# Patient Record
Sex: Female | Born: 1950
Health system: Southern US, Community
[De-identification: ages and names within clinical notes are randomized; demographics above are authoritative.]

## PROBLEM LIST (undated history)

## (undated) DIAGNOSIS — J449 Chronic obstructive pulmonary disease, unspecified: Secondary | ICD-10-CM

## (undated) DIAGNOSIS — E119 Type 2 diabetes mellitus without complications: Secondary | ICD-10-CM

## (undated) DIAGNOSIS — R609 Edema, unspecified: Secondary | ICD-10-CM

## (undated) DIAGNOSIS — R600 Localized edema: Secondary | ICD-10-CM

## (undated) DIAGNOSIS — K56609 Unspecified intestinal obstruction, unspecified as to partial versus complete obstruction: Secondary | ICD-10-CM

## (undated) DIAGNOSIS — J189 Pneumonia, unspecified organism: Secondary | ICD-10-CM

## (undated) DIAGNOSIS — M1990 Unspecified osteoarthritis, unspecified site: Secondary | ICD-10-CM

## (undated) DIAGNOSIS — E785 Hyperlipidemia, unspecified: Secondary | ICD-10-CM

## (undated) DIAGNOSIS — K635 Polyp of colon: Secondary | ICD-10-CM

## (undated) DIAGNOSIS — J45909 Unspecified asthma, uncomplicated: Secondary | ICD-10-CM

## (undated) DIAGNOSIS — I10 Essential (primary) hypertension: Secondary | ICD-10-CM

## (undated) HISTORY — DX: Unspecified asthma, uncomplicated: J45.909

## (undated) HISTORY — PX: APPENDECTOMY: SHX54

## (undated) HISTORY — DX: Hyperlipidemia, unspecified: E78.5

## (undated) HISTORY — DX: Edema, unspecified: R60.9

## (undated) HISTORY — PX: SMALL INTESTINE SURGERY: SHX150

## (undated) HISTORY — PX: LAPAROSCOPIC INCISIONAL / UMBILICAL / VENTRAL HERNIA REPAIR: SUR789

## (undated) HISTORY — DX: Localized edema: R60.0

## (undated) HISTORY — DX: Polyp of colon: K63.5

## (undated) HISTORY — PX: HERNIA REPAIR: SHX51

## (undated) HISTORY — PX: ESOPHAGEAL DILATION: SHX303

---

## 1976-04-20 HISTORY — PX: TUBAL LIGATION: SHX77

## 1977-04-20 HISTORY — PX: CHOLECYSTECTOMY: SHX55

## 2000-11-19 ENCOUNTER — Ambulatory Visit (HOSPITAL_COMMUNITY): Admission: RE | Admit: 2000-11-19 | Discharge: 2000-11-19 | Payer: Self-pay | Admitting: Family Medicine

## 2000-11-19 ENCOUNTER — Encounter: Payer: Self-pay | Admitting: Family Medicine

## 2001-01-04 ENCOUNTER — Encounter: Payer: Self-pay | Admitting: Family Medicine

## 2001-01-04 ENCOUNTER — Ambulatory Visit (HOSPITAL_COMMUNITY): Admission: RE | Admit: 2001-01-04 | Discharge: 2001-01-04 | Payer: Self-pay | Admitting: Family Medicine

## 2001-02-02 ENCOUNTER — Ambulatory Visit (HOSPITAL_COMMUNITY): Admission: RE | Admit: 2001-02-02 | Discharge: 2001-02-02 | Payer: Self-pay | Admitting: General Surgery

## 2001-02-08 ENCOUNTER — Inpatient Hospital Stay (HOSPITAL_COMMUNITY): Admission: RE | Admit: 2001-02-08 | Discharge: 2001-02-14 | Payer: Self-pay | Admitting: General Surgery

## 2001-02-26 ENCOUNTER — Emergency Department (HOSPITAL_COMMUNITY): Admission: EM | Admit: 2001-02-26 | Discharge: 2001-02-26 | Payer: Self-pay | Admitting: Internal Medicine

## 2001-02-26 ENCOUNTER — Encounter: Payer: Self-pay | Admitting: Internal Medicine

## 2001-03-15 ENCOUNTER — Inpatient Hospital Stay (HOSPITAL_COMMUNITY): Admission: RE | Admit: 2001-03-15 | Discharge: 2001-03-16 | Payer: Self-pay | Admitting: General Surgery

## 2002-01-26 ENCOUNTER — Inpatient Hospital Stay (HOSPITAL_COMMUNITY): Admission: AD | Admit: 2002-01-26 | Discharge: 2002-01-30 | Payer: Self-pay | Admitting: Family Medicine

## 2002-01-27 ENCOUNTER — Encounter: Payer: Self-pay | Admitting: Family Medicine

## 2002-09-11 ENCOUNTER — Ambulatory Visit (HOSPITAL_COMMUNITY): Admission: RE | Admit: 2002-09-11 | Discharge: 2002-09-11 | Payer: Self-pay | Admitting: General Surgery

## 2002-09-29 ENCOUNTER — Inpatient Hospital Stay (HOSPITAL_COMMUNITY): Admission: AD | Admit: 2002-09-29 | Discharge: 2002-10-02 | Payer: Self-pay | Admitting: Family Medicine

## 2002-09-29 ENCOUNTER — Encounter: Payer: Self-pay | Admitting: Family Medicine

## 2003-03-28 ENCOUNTER — Ambulatory Visit (HOSPITAL_COMMUNITY): Admission: RE | Admit: 2003-03-28 | Discharge: 2003-03-28 | Payer: Self-pay | Admitting: Family Medicine

## 2003-07-20 HISTORY — PX: KNEE CARTILAGE SURGERY: SHX688

## 2003-07-26 ENCOUNTER — Encounter: Payer: Self-pay | Admitting: Orthopedic Surgery

## 2003-08-06 ENCOUNTER — Ambulatory Visit (HOSPITAL_COMMUNITY): Admission: RE | Admit: 2003-08-06 | Discharge: 2003-08-06 | Payer: Self-pay | Admitting: Orthopedic Surgery

## 2003-08-15 ENCOUNTER — Ambulatory Visit (HOSPITAL_COMMUNITY): Admission: RE | Admit: 2003-08-15 | Discharge: 2003-08-15 | Payer: Self-pay | Admitting: Orthopedic Surgery

## 2003-08-15 ENCOUNTER — Encounter: Payer: Self-pay | Admitting: Orthopedic Surgery

## 2003-08-23 ENCOUNTER — Encounter (HOSPITAL_COMMUNITY): Admission: RE | Admit: 2003-08-23 | Discharge: 2003-09-22 | Payer: Self-pay | Admitting: Orthopedic Surgery

## 2003-10-24 ENCOUNTER — Ambulatory Visit (HOSPITAL_COMMUNITY): Admission: RE | Admit: 2003-10-24 | Discharge: 2003-10-24 | Payer: Self-pay | Admitting: Family Medicine

## 2004-03-20 ENCOUNTER — Ambulatory Visit (HOSPITAL_COMMUNITY): Admission: RE | Admit: 2004-03-20 | Discharge: 2004-03-20 | Payer: Self-pay | Admitting: Family Medicine

## 2004-06-02 ENCOUNTER — Ambulatory Visit: Payer: Self-pay | Admitting: Orthopedic Surgery

## 2004-06-16 ENCOUNTER — Ambulatory Visit: Payer: Self-pay | Admitting: Orthopedic Surgery

## 2004-07-07 ENCOUNTER — Ambulatory Visit: Payer: Self-pay | Admitting: Orthopedic Surgery

## 2004-09-10 ENCOUNTER — Ambulatory Visit: Payer: Self-pay | Admitting: Orthopedic Surgery

## 2004-09-18 ENCOUNTER — Inpatient Hospital Stay (HOSPITAL_COMMUNITY): Admission: AD | Admit: 2004-09-18 | Discharge: 2004-09-21 | Payer: Self-pay | Admitting: Family Medicine

## 2004-10-01 ENCOUNTER — Ambulatory Visit: Payer: Self-pay | Admitting: Orthopedic Surgery

## 2004-12-02 ENCOUNTER — Ambulatory Visit (HOSPITAL_COMMUNITY): Admission: RE | Admit: 2004-12-02 | Discharge: 2004-12-02 | Payer: Self-pay | Admitting: Family Medicine

## 2004-12-03 ENCOUNTER — Ambulatory Visit: Payer: Self-pay | Admitting: Orthopedic Surgery

## 2004-12-05 ENCOUNTER — Ambulatory Visit (HOSPITAL_COMMUNITY): Admission: RE | Admit: 2004-12-05 | Discharge: 2004-12-05 | Payer: Self-pay | Admitting: Family Medicine

## 2005-02-06 ENCOUNTER — Encounter (INDEPENDENT_AMBULATORY_CARE_PROVIDER_SITE_OTHER): Payer: Self-pay | Admitting: General Surgery

## 2005-02-06 ENCOUNTER — Ambulatory Visit (HOSPITAL_COMMUNITY): Admission: RE | Admit: 2005-02-06 | Discharge: 2005-02-06 | Payer: Self-pay | Admitting: General Surgery

## 2005-02-23 ENCOUNTER — Ambulatory Visit: Payer: Self-pay | Admitting: Orthopedic Surgery

## 2005-02-26 ENCOUNTER — Ambulatory Visit (HOSPITAL_COMMUNITY): Admission: RE | Admit: 2005-02-26 | Discharge: 2005-02-26 | Payer: Self-pay | Admitting: Orthopedic Surgery

## 2005-03-05 ENCOUNTER — Ambulatory Visit: Payer: Self-pay | Admitting: Orthopedic Surgery

## 2005-03-10 ENCOUNTER — Ambulatory Visit (HOSPITAL_COMMUNITY): Admission: RE | Admit: 2005-03-10 | Discharge: 2005-03-10 | Payer: Self-pay | Admitting: Family Medicine

## 2005-03-31 ENCOUNTER — Encounter: Admission: RE | Admit: 2005-03-31 | Discharge: 2005-03-31 | Payer: Self-pay | Admitting: Orthopedic Surgery

## 2005-04-15 ENCOUNTER — Encounter: Admission: RE | Admit: 2005-04-15 | Discharge: 2005-04-15 | Payer: Self-pay | Admitting: Orthopedic Surgery

## 2005-04-27 ENCOUNTER — Encounter: Admission: RE | Admit: 2005-04-27 | Discharge: 2005-04-27 | Payer: Self-pay | Admitting: Orthopedic Surgery

## 2005-09-24 ENCOUNTER — Ambulatory Visit: Payer: Self-pay | Admitting: Orthopedic Surgery

## 2005-09-30 ENCOUNTER — Ambulatory Visit (HOSPITAL_COMMUNITY): Admission: RE | Admit: 2005-09-30 | Discharge: 2005-09-30 | Payer: Self-pay | Admitting: Orthopedic Surgery

## 2005-09-30 ENCOUNTER — Encounter (HOSPITAL_COMMUNITY): Admission: RE | Admit: 2005-09-30 | Discharge: 2005-10-30 | Payer: Self-pay | Admitting: Orthopedic Surgery

## 2005-10-14 ENCOUNTER — Ambulatory Visit: Payer: Self-pay | Admitting: Orthopedic Surgery

## 2005-11-02 ENCOUNTER — Encounter (HOSPITAL_COMMUNITY): Admission: RE | Admit: 2005-11-02 | Discharge: 2005-12-02 | Payer: Self-pay | Admitting: Orthopedic Surgery

## 2005-11-10 ENCOUNTER — Ambulatory Visit: Payer: Self-pay | Admitting: Orthopedic Surgery

## 2006-04-20 HISTORY — PX: COLONOSCOPY: SHX174

## 2006-12-07 ENCOUNTER — Ambulatory Visit: Payer: Self-pay | Admitting: Orthopedic Surgery

## 2006-12-07 DIAGNOSIS — E119 Type 2 diabetes mellitus without complications: Secondary | ICD-10-CM | POA: Insufficient documentation

## 2006-12-10 ENCOUNTER — Ambulatory Visit (HOSPITAL_COMMUNITY): Admission: RE | Admit: 2006-12-10 | Discharge: 2006-12-10 | Payer: Self-pay | Admitting: Orthopedic Surgery

## 2006-12-14 ENCOUNTER — Ambulatory Visit: Payer: Self-pay | Admitting: Orthopedic Surgery

## 2006-12-31 ENCOUNTER — Ambulatory Visit (HOSPITAL_COMMUNITY): Admission: RE | Admit: 2006-12-31 | Discharge: 2006-12-31 | Payer: Self-pay | Admitting: Family Medicine

## 2007-01-04 ENCOUNTER — Encounter: Admission: RE | Admit: 2007-01-04 | Discharge: 2007-01-04 | Payer: Self-pay | Admitting: Family Medicine

## 2007-01-19 ENCOUNTER — Ambulatory Visit: Payer: Self-pay | Admitting: Orthopedic Surgery

## 2007-01-28 ENCOUNTER — Inpatient Hospital Stay (HOSPITAL_COMMUNITY): Admission: RE | Admit: 2007-01-28 | Discharge: 2007-01-31 | Payer: Self-pay | Admitting: Gastroenterology

## 2007-01-28 ENCOUNTER — Ambulatory Visit: Payer: Self-pay | Admitting: Gastroenterology

## 2007-01-28 ENCOUNTER — Encounter: Payer: Self-pay | Admitting: Gastroenterology

## 2007-02-15 ENCOUNTER — Ambulatory Visit (HOSPITAL_COMMUNITY): Admission: RE | Admit: 2007-02-15 | Discharge: 2007-02-15 | Payer: Self-pay | Admitting: Family Medicine

## 2007-02-23 ENCOUNTER — Ambulatory Visit: Payer: Self-pay | Admitting: Orthopedic Surgery

## 2007-02-23 DIAGNOSIS — M76829 Posterior tibial tendinitis, unspecified leg: Secondary | ICD-10-CM

## 2007-03-10 ENCOUNTER — Telehealth: Payer: Self-pay | Admitting: Orthopedic Surgery

## 2007-03-28 ENCOUNTER — Telehealth: Payer: Self-pay | Admitting: Orthopedic Surgery

## 2007-04-07 ENCOUNTER — Ambulatory Visit: Payer: Self-pay | Admitting: Orthopedic Surgery

## 2007-04-20 ENCOUNTER — Telehealth: Payer: Self-pay | Admitting: Orthopedic Surgery

## 2007-07-19 ENCOUNTER — Ambulatory Visit: Payer: Self-pay | Admitting: Orthopedic Surgery

## 2007-08-02 ENCOUNTER — Encounter: Payer: Self-pay | Admitting: Orthopedic Surgery

## 2007-09-08 ENCOUNTER — Ambulatory Visit: Payer: Self-pay | Admitting: Orthopedic Surgery

## 2007-09-08 DIAGNOSIS — M1710 Unilateral primary osteoarthritis, unspecified knee: Secondary | ICD-10-CM

## 2007-09-26 ENCOUNTER — Ambulatory Visit: Payer: Self-pay | Admitting: Orthopedic Surgery

## 2007-10-28 ENCOUNTER — Ambulatory Visit (HOSPITAL_COMMUNITY): Admission: RE | Admit: 2007-10-28 | Discharge: 2007-10-28 | Payer: Self-pay | Admitting: Family Medicine

## 2007-12-05 ENCOUNTER — Ambulatory Visit (HOSPITAL_COMMUNITY): Admission: RE | Admit: 2007-12-05 | Discharge: 2007-12-05 | Payer: Self-pay | Admitting: Family Medicine

## 2007-12-13 ENCOUNTER — Ambulatory Visit (HOSPITAL_COMMUNITY): Admission: RE | Admit: 2007-12-13 | Discharge: 2007-12-13 | Payer: Self-pay | Admitting: Family Medicine

## 2009-02-12 ENCOUNTER — Emergency Department (HOSPITAL_COMMUNITY): Admission: EM | Admit: 2009-02-12 | Discharge: 2009-02-12 | Payer: Self-pay | Admitting: Emergency Medicine

## 2009-04-03 ENCOUNTER — Ambulatory Visit: Payer: Self-pay | Admitting: Orthopedic Surgery

## 2009-06-11 ENCOUNTER — Ambulatory Visit: Payer: Self-pay | Admitting: Orthopedic Surgery

## 2009-06-11 DIAGNOSIS — M6530 Trigger finger, unspecified finger: Secondary | ICD-10-CM | POA: Insufficient documentation

## 2009-08-12 ENCOUNTER — Ambulatory Visit: Payer: Self-pay | Admitting: Orthopedic Surgery

## 2009-08-23 ENCOUNTER — Ambulatory Visit: Payer: Self-pay | Admitting: Orthopedic Surgery

## 2009-10-02 ENCOUNTER — Ambulatory Visit (HOSPITAL_COMMUNITY): Admission: RE | Admit: 2009-10-02 | Discharge: 2009-10-02 | Payer: Self-pay | Admitting: Family Medicine

## 2009-10-02 ENCOUNTER — Encounter: Payer: Self-pay | Admitting: Orthopedic Surgery

## 2009-10-24 ENCOUNTER — Encounter: Payer: Self-pay | Admitting: Gastroenterology

## 2009-12-26 ENCOUNTER — Ambulatory Visit (HOSPITAL_COMMUNITY): Admission: RE | Admit: 2009-12-26 | Discharge: 2009-12-26 | Payer: Self-pay | Admitting: Family Medicine

## 2010-05-10 ENCOUNTER — Encounter: Payer: Self-pay | Admitting: Family Medicine

## 2010-05-11 ENCOUNTER — Encounter: Payer: Self-pay | Admitting: Family Medicine

## 2010-05-20 NOTE — Assessment & Plan Note (Signed)
Summary: rt hand pain/swelling needs xr/self pay/bsf   Vital Signs:  Patient profile:   60 year old female Pulse rate:   78 / minute Resp:     16 per minute  Vitals Entered By: Fuller Canada MD (June 11, 2009 1:12 PM)  Visit Type:  new problem Referring Provider:  self Primary Provider:  Dr. Lubertha South   History of Present Illness: I saw [name] in the office today for a followup visit.  She is a 60 years old woman with the complaint of:  right hand and thumb pain, trigger thumb.  Patient complains of pain in her RIGHT thumb for 2 weeks no injury complains of locking.  She is taking some Norco 7.5 she says that helps a little bit.  She had a brace which immobilized the thumb and she associates and tingling in her RIGHT hand as well.  She complains of locking and catching her symptoms are constant came on gradually and improved by nothing she complains of some sharp throbbing intermediate type pain.  She has an obvious trigger finger.  Meds: Metformin, Celexa, Pravastatin, Glyburide.  Allergies (verified): 1)  ! Penicillin  Past History:  Past medical, surgical, family and social histories (including risk factors) reviewed, and no changes noted (except as noted below).  Past Medical History: Diabetes htn depression  Past Surgical History: Reviewed history from 12/07/2006 and no changes required. Appendectomy Cholecystectomy tubal 2 hernia repairs 08-15-03 arthroscopy right knee -Dr. Romeo Apple, 08-15-03 medial ,eniscetomy right knee-Dr. Romeo Apple,  Family History: Reviewed history and no changes required. FH of Cancer:  Family History of Diabetes Family History Coronary Heart Disease female < 79 Family History of Arthritis  Social History: Reviewed history and no changes required. Patient is married.  office manager 1ppd cigs no alcohol 3 cups of caffeine per day  Review of Systems General:  Complains of fatigue; denies weight loss, weight gain,  fever, and chills. Neuro:  Complains of numbness; denies headache, dizziness, migraines, weakness, tremor, and unsteady walking; tingling. MS:  Complains of joint pain and joint swelling; denies rheumatoid arthritis, gout, bone cancer, osteoporosis, and ; stiffness.  The review of systems is negative for Cardiac , Resp, GI, GU, Endo, Psych, Derm, EENT, Immunology, and Lymphatic.  Physical Exam  Additional Exam:   VS reviewed and were normal  GEN: appearance was normal   CDV: normal pulses temperature and no edema  LYMPH nodes were normal   SKIN was normal   Neuro: normal sensation Psyche: AAO x 3 and mood was normal   MSK *Gait was normal  *Inspection Tenderness over the A1 pulley of the RIGHT hand/thumb, range of motion however is intact much and power at the IP joint is normal and the joint is stable.    Impression & Recommendations:  Problem # 1:  TRIGGER FINGER (ICD-727.03) Assessment New  RIGHT thumb injection Verbal consent was obtained: The finger was prepped with ethyl chloride and injected with 1:1 injection of .25% sensorcaine, 1cc  and 40 mg of depomedrol, 1cc. There were no complications.  Orders: Est. Patient Level IV (54098) Joint Aspirate / Injection, Small (11914) Depo- Medrol 40mg  (J1030)  Patient Instructions: 1)  You have received an injection of cortisone today. You may experience increased pain at the injection site. Apply ice pack to the area for 20 minutes every 2 hours and take 2 xtra strength tylenol every 8 hours. This increased pain will usually resolve in 24 hours. The injection will take effect in 3-10 days.  2)  Please schedule a follow-up appointment as needed.

## 2010-05-20 NOTE — Letter (Signed)
Summary: Out of Work  [facility]  [address]  [place], [place] [address]   Phone: [phone]  Fax: [phone]    August 12, 2009   Employee:  LEANNE SISLER    To Whom It May Concern:   For Medical reasons, please excuse the above named employee from work for the following dates:  Start:   August 12, 2009  End:   May 9th  If you need additional information, please feel free to contact our office.         Sincerely,    Fuller Canada MD

## 2010-05-20 NOTE — Assessment & Plan Note (Signed)
Summary: LT FOOT PAIN RECURRING/?XRAY/SELF PAY/CAF   Visit Type:  Follow-up Referring Provider:  self Primary Provider:  Dr. Lubertha South  CC:  left foot pain.  History of Present Illness: I saw [name] in the office today for a followup visit.  She is a 60 years old woman with the complaint of:  severe pain LEFT foot  The patient has a history of posterior tibial tendon dysfunction with MRI documented arthritis in the foot without a tear so she basically has a grade 2 condition in the tendon but grade 3-4 changes in the foot probably needs tendon transfer with bone fusion or calcaneal slide at minimum.  She is currently uninsured.  She does work and worked last Monday.  Pain present for 3 days medial side of the foot associated with weakness    Meds: Metformin, Celexa, Pravastatin, Glyburide.  Foot pain for 3 days, yesterday extremely bad. She has taken Hydrocodone 7.5, ice, heat and arthritis medicine and they are not helping.  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 One By Mouth Q 4 Hrs As Needed Pain  Allergies (verified): 1)  ! Penicillin  Review of Systems Constitutional:  Denies weight loss, weight gain, fever, chills, and fatigue. Cardiovascular:  Denies chest pain, palpitations, fainting, and murmurs. Respiratory:  Denies short of breath, wheezing, couch, tightness, pain on inspiration, and snoring . Musculoskeletal:  See HPI.  Physical Exam  Additional Exam:  overweight white female normal appearance.  Oriented x3.  Mood and affect normal.  Gait and station abnormal severe limp LEFT leg.  She has a flattened foot poor subtalar motion ankle stable tenderness medial side poor strength on tiptoe to skin intact pulses  normal sensation normal coordination and balance abnormal secondary to pain in the LEFT foot   Impression & Recommendations:  Problem # 1:  TIBIALIS TENDINITIS (ICD-726.72) Assessment Deteriorated  previous MRI showed no tear in the tendon but degenerative arthritis in the foot this is a close call in terms of what to do but tendon needs to be debris to possible tendon transfer and then bone work as needed fusion vs. calcaneal slide, triple arthrodesis may not be adequate  I will order a Cam Walker for her from [facility] its $25.95.  I want her to be nonweightbearing for 2 weeks then Cam Walker for 4 weeks and then reassess.  If she needs surgery she'll need foot and ankle specialist podiatrist could suffice as well  Orders: Est. Patient Level III (54098)  Medications Added to Medication List This Visit: 1)  Norco 7.5-325 Mg Tabs (Hydrocodone-acetaminophen) .... One by mouth q 4 hrs as needed pain  Patient Instructions: 1)  Do not put any pressure on the foot for 2 weeks 2)  Use the walker for 2 weeks 3)  We will try to order you a cheaper boot, large cam walker boot 4)  we will call you when we get the boot in Prescriptions: NORCO 7.5-325 MG TABS (HYDROCODONE-ACETAMINOPHEN) one by mouth q 4 hrs as needed pain  #42 x 3   Entered and Authorized by:   Fuller Canada MD   Signed by:   Fuller Canada MD on 08/12/2009   Method used:   Print then Give to Patient  RxID:   [id number]

## 2010-05-20 NOTE — Letter (Signed)
Summary: Medical record request Disab determination  Medical record request Disab determination   Imported By: Cammie Sickle 10/07/2009 18:17:27  _____________________________________________________________________  External Attachment:    Type:   Image     Comment:   External Document

## 2010-05-20 NOTE — Letter (Signed)
Summary: Historic Patient File  Historic Patient File   Imported By: Elvera Maria 06/14/2009 10:18:02  _____________________________________________________________________  External Attachment:    Type:   Image     Comment:   history form

## 2010-05-20 NOTE — Letter (Signed)
Summary: disability claim forms  disability claim forms   Imported By: Rosine Beat 10/24/2009 09:17:01  _____________________________________________________________________  External Attachment:    Type:   Image     Comment:   External Document

## 2010-07-24 LAB — CBC
HCT: 44.8 % (ref 36.0–46.0)
Hemoglobin: 15.3 g/dL — ABNORMAL HIGH (ref 12.0–15.0)
MCHC: 34.2 g/dL (ref 30.0–36.0)
MCV: 86.5 fL (ref 78.0–100.0)
Platelets: 193 K/uL (ref 150–400)
RBC: 5.18 MIL/uL — ABNORMAL HIGH (ref 3.87–5.11)
RDW: 13 % (ref 11.5–15.5)
WBC: 8.6 K/uL (ref 4.0–10.5)

## 2010-07-24 LAB — BASIC METABOLIC PANEL WITH GFR
BUN: 7 mg/dL (ref 6–23)
GFR calc non Af Amer: >60 mL/min (ref >60)
Potassium: 3.9 meq/L (ref 3.5–5.1)
Sodium: 138 meq/L (ref 135–145)

## 2010-07-24 LAB — DIFFERENTIAL
Basophils Absolute: 0 10*3/uL (ref 0.0–0.1)
Basophils Relative: 1 % (ref 0–1)
Eosinophils Absolute: 0.1 10*3/uL (ref 0.0–0.7)
Eosinophils Relative: 1 % (ref 0–5)
Lymphocytes Relative: 34 % (ref 12–46)
Lymphs Abs: 2.9 K/uL (ref 0.7–4.0)
Monocytes Absolute: 0.4 10*3/uL (ref 0.1–1.0)
Monocytes Relative: 4 % (ref 3–12)
Neutro Abs: 5.2 K/uL (ref 1.7–7.7)
Neutrophils Relative %: 60 % (ref 43–77)

## 2010-07-24 LAB — BASIC METABOLIC PANEL
CO2: 28 mEq/L (ref 19–32)
Calcium: 9.3 mg/dL (ref 8.4–10.5)
Chloride: 104 mEq/L (ref 96–112)
Creatinine, Ser: 0.61 mg/dL (ref 0.4–1.2)
GFR calc Af Amer: >60 mL/min (ref >60)
Glucose, Bld: 154 mg/dL — ABNORMAL HIGH (ref 70–99)

## 2010-09-02 NOTE — Op Note (Signed)
[name], [name]                 ACCOUNT NO.:  [id number]   MEDICAL RECORD NO.:  [id number]          PATIENT TYPE:  OBV   LOCATION:  A330                          FACILITY:  [facility]   PHYSICIAN:  Kassie Mends, M.D.      DATE OF BIRTH:  [birth year]   DATE OF PROCEDURE:  01/28/2007  DATE OF DISCHARGE:                               OPERATIVE REPORT   REFERRING PHYSICIAN:  Lubertha South, M.D.   PROCEDURE:  Colonoscopy with cold forceps and snare cautery polypectomy.   INDICATION FOR EXAM:  [name] is a 60 year old female whose mother  had colon cancer at age 61.  She also had polyps in the 1990s.  Last  colonoscopy was 5 years ago.   FINDINGS:  1. An 8-mm sessile cecal polyp removed via snare cautery.  A 5-mm      sessile transverse colon polyp removed via cold forceps.  2. Tortuous sigmoid colon.  Otherwise no masses, inflammatory changes,      diverticula or AVMs seen.  3. Small internal hemorrhoids.  Otherwise normal retroflexed view of      the rectum.   RECOMMENDATIONS:  1. Screening colonoscopy in 5 years.  2. Will call Ms. Harmon with the results of her biopsies.  3. She should follow a high-fiber diet.  She was given handout on high-      fiber diet, polyps and hemorrhoids.  4. No aspirin, NSAIDs or anticoagulation for 7 days.   MEDICATIONS:  1. Demerol 50 mg IV.  2. Versed 6 mg IV.   PROCEDURE TECHNIQUE:  Physical exam was performed.  Informed consent was  obtained from the patient explaining benefits, risks and alternatives to  the procedure.  The patient was connected to the monitor, placed in the  left lateral position.  Continuous oxygen was provided by nasal cannula  and IV medicine administered through an indwelling cannula.  After  administration of sedation and rectal exam, the patient's rectum was  intubated and the  scope was advanced under direct visualization to the cecum.  The scope  was removed slowly by carefully examining the color, texture, anatomy  and integrity of the mucosa on the way out.  The patient was recovered  in endoscopy and discharged home in satisfactory condition.      Kassie Mends, M.D.  Electronically Signed     SM/MEDQ  D:  01/28/2007  T:  01/28/2007  Job:  [id number]   cc:   Donna Bernard, M.D.  Fax: [phone]

## 2010-09-02 NOTE — H&P (Signed)
[name], [name]                 ACCOUNT NO.:  [id number]   MEDICAL RECORD NO.:  [id number]          PATIENT TYPE:  OBV   LOCATION:  A330                          FACILITY:  [facility]   PHYSICIAN:  Scott A. Gerda Diss, MD    DATE OF BIRTH:  [birth year]   DATE OF ADMISSION:  01/28/2007  DATE OF DISCHARGE:  LH                              HISTORY & PHYSICAL   CHIEF COMPLAINT:  Hypoxemia.   HISTORY OF THE PRESENT ILLNESS:  This is a 60 year old white female with  a history of diabetes, reactive airway COPD and hypertension who went  for routine colonoscopy and was found to be having problems with  hypoxemia so we were called and asked to admit the patient.   PAST MEDICAL HISTORY:  1. Diabetes.  2. Reactive airway disease.  3. Hypertension.  4. Peripheral edema.  5. History of colon polyps.  6. Hyperlipidemia.   REVIEW OF SYSTEMS:  The review of systems is negative for fever.  Positive for recent bronchitis on Biaxin 500 twice a day.   HABITS:  The patient does smoke.  She does not drink   MEDICATIONS:  1. Biaxin 500 mg twice a day.  2. Glucophage 1,000 mg in the morning, 500 mg at noon and 1,000 mg in      the evening.  3. Lantus 15 units at bedtime.  4. Reglan 10 mg q.a.c. and at bedtime.  5. Protonix 40 mg daily.  6. Paxil 40 mg daily.  7. Propranolol 10 mg daily.  8. Ventolin as needed.  9. Phenergan 25 mg as needed.  10.Pravachol 40 mg each evening   LABORATORY DATA:  Chest x-ray did not show a pneumonia.  Blood gas does  show significant 7.36 pH with a pCO2 of 48 and a pO2 of 41, but with an  O2 sat of 77.  She is currently on nasal cannula oxygen to get the O2  sats in the mid 90s   PHYSICAL EXAMINATION:  HEENT:  On examination TMs are normal.  Normocephalic and atraumatic.  NECK: The neck is supple.  CHEST AND LUNGS:  Bilateral expiratory wheezes.  Not in respiratory  distress.  HEART:  The heart is regular.  ABDOMEN:  The abdomen is soft.  EXTREMITIES:  The  extremities have no edema.   ASSESSMENT AND PLAN:  Reactive airway disease along with asthma flare  and bronchitis.  Continue antibiotics and breathing treatments.  If she  is not doing better by the morning I will add steroids; but, I am  hesitant to add steroids right at the moment because of the increased  risk that it could trigger a significant jump in her diabetes, but may  need to do so otherwise.  I will recheck in the morning.      Scott A. Gerda Diss, MD  Electronically Signed     SAL/MEDQ  D:  01/28/2007  T:  01/29/2007  Job:  [id number]

## 2010-09-02 NOTE — Group Therapy Note (Signed)
[name], DAZEY                 ACCOUNT NO.:  [id number]   MEDICAL RECORD NO.:  [id number]          PATIENT TYPE:  INP   LOCATION:  A330                          FACILITY:  [facility]   PHYSICIAN:  Scott A. Gerda Diss, MD    DATE OF BIRTH:  1951-02-06   DATE OF PROCEDURE:  01/30/2007  DATE OF DISCHARGE:                                 PROGRESS NOTE   The patient feels somewhat better compared to where she was.  She is not  having any chest pressure.  She just has more a little bit of shortness  of breath but she is breathing better than she was, less coughing.  She  has a long smoking history and has a significant history of bronchitis  and reactive airway.  I am sure she has some underlying smoking damage.  Her lungs today have expiratory wheezes bilateral with a little bit of  crackles in the right base.  Heart is regular, vital signs are stable.  A/P reactive airway bronchitis.  Continue antibiotics, continue with  steroids and the breathing treatments as well and monitor the patient  closely and hopefully be able to go home tomorrow morning, will get a  blood gas on room air.      Scott A. Gerda Diss, MD  Electronically Signed     SAL/MEDQ  D:  01/30/2007  T:  01/30/2007  Job:  [id number]

## 2010-09-05 NOTE — H&P (Signed)
[name], [name]                           ACCOUNT NO.:  [id number]   MEDICAL RECORD NO.:  [id number]                   PATIENT TYPE:  INP   LOCATION:  A314                                 FACILITY:  [facility]   PHYSICIAN:  Scott A. Gerda Diss, M.D.               DATE OF BIRTH:  30-May-1950   DATE OF ADMISSION:  01/26/2002  DATE OF DISCHARGE:                                HISTORY & PHYSICAL   CHIEF COMPLAINT:  Abdominal pain, fever.   HISTORY OF PRESENT ILLNESS:  This is a 60 year old white female who has had  significant abdominal pain, fever and discomfort for several days.  She was  seen on 01/24/02 with a 3 day history of fever and intermittent abdominal  pain and discomfort.  She has had a history of bacterial colitis.  The  patient denied any vomiting or diarrhea at the time of being seen on  01/24/02.  On 01/26/02, she related fever, chills, headache, stomach  soreness, occasional cough, no runny nose, negative dysuria or urinary  frequency.  Related positive vaginal discharge.  Denies being sexually  active.  The patient states the pain becoming worse and the fever becoming  worse along with severe anorexia secondary to feeling bad has caused her to  want to be seen.   PAST MEDICAL HISTORY:  1. Diabetes mellitus, type 2.  2. Arthritis.  3. Reactive airway.  4. High-density nebulizer.  5. Peripheral edema.  6. Hyperlipidemia.   SURGICAL HISTORY:  1. Cholecystectomy in 1979.  2. Appendectomy in 1972.  3. Hernia repair in 1996.  4. History of abscess, unknown date.   FAMILY HISTORY:  Colon cancer.   ALLERGIES:  Penicillin causes break out.   SOCIAL HISTORY:  Does smoke.  Is widowed.   MEDICATIONS:  1. Celebrex 200 mg q.d.  2. Phenergan 25 mg p.r.n.  3. Dyazide 37.5/25 q.d.  4. Glucophage 500 mg 2 in the morning, 1 in the afternoon, 2 in the evening.  5. Avandia 8 mg q.d.  6. Generic Prilosec daily.  7. Paxil 20 mg daily.  8. Reglan 1 q.i.d.   REVIEW OF  SYSTEMS:  Per above.   PHYSICAL EXAMINATION:  HEENT:  Benign.  TMs are normal.  NECK:  Supple.  CHEST:  Clear to auscultation.  HEART:  Regular.  ABDOMEN:  Soft, mild intermittent abdominal tenderness throughout with no  guarding.  EXTREMITIES:  No edema.  PELVIC:  Mucoid discharge from the cervix is noted with extreme cervical  motion tenderness.  No masses felt.   LABORATORY DATA:  WBC 8.1, albumin 2.7, lipase normal.  Liver enzymes  negative.   ASSESSMENT/PLAN:  1. Abdominal pain/pelvic inflammatory disease - failed outpatient     management.  Recommend admission into the hospital.  Will cover with     Flagyl and Cipro IV.  2. Monitor the patient closely.  3. Treat pain medication appropriately, to  set up for ultrasound versus CT     scan on the following day for further evaluation of her problem and to     make sure she is not having an abscess, given her history thereof of     abscess.  4. Follow up accordingly in the hospital.                                               Scott A. Gerda Diss, M.D.    SAL/MEDQ  D:  01/27/2002  T:  01/28/2002  Job:  [id number]

## 2010-09-05 NOTE — Op Note (Signed)
[facility]  Patient:    [name], [name] Visit Number: [id number] MRN: [id number]          Service Type: SUR Location: 2A A211 01 Attending Physician:  Dessa Phi Dictated by:   Elpidio Anis, M.D. Proc. Date: 03/15/01 Admit Date:  03/15/2001                             Operative Report  PREOPERATIVE DIAGNOSIS:  Full-thickness necrosis of anterior abdominal wall.  POSTOPERATIVE DIAGNOSIS:  Full-thickness necrosis of anterior abdominal wall.  OPERATION:  Wide full-thickness excision and closure of anterior abdominal wall.  SURGEON:  Elpidio Anis, M.D.  DESCRIPTION OF PROCEDURE:  Under general anesthesia, the patients abdomen was prepped and draped in a sterile field.  In the mid portion of the incision, around the umbilicus, the full-thickness of the skin and subcutaneous tissue was excised down to the fascia and to the mesh.  The mesh was exposed in an area around the umbilicus.  There was no gross purulence noted.  Once all the necrotic tissue was removed, the area was flushed with copious amounts of saline and then Betadine and saline.  The anterior abdominal wall was then closed over the mesh after placing a JP drain next to the mesh.  The subcutaneous tissue was closed with interrupted 0 Biosyn.  This was done immediately over the mesh.  The remainder of the tissue was closed with 2-0 Biosyn, and the skin was closed with interrupted 3-0 Prolene.  The drain was secured with 3-0 nylon.  The patient tolerated the procedure well.  Sterile dressing was placed.  She was awakened from anesthesia, transferred to a bed, and taken to the postanesthesia care unit. Dictated by:   Elpidio Anis, M.D. Attending Physician:  Dessa Phi DD:  03/15/01 TD:  03/15/01 Job: [id number]

## 2010-09-05 NOTE — Discharge Summary (Signed)
   Cindy, Kennedy NO.:  [id number]   MEDICAL RECORD NO.:  [id number]                  PATIENT TYPE:   LOCATION:                                       FACILITY:   PHYSICIAN:  Donna Bernard, M.D.             DATE OF BIRTH:   DATE OF ADMISSION:  DATE OF DISCHARGE:  10/02/2002                                 DISCHARGE SUMMARY   FINAL DIAGNOSES:  1. Exacerbation of asthma.  2. Type 2 diabetes.   DISPOSITION:  1. The patient discharge home.  2. Follow up with Dr. Donna Bernard in about a week.  3. Call us if sugar has remained elevated above 200.   DISCHARGE MEDICATIONS:  1. Prednisone taper as directed.  2. Levaquin 500 mg daily for the next 5 days.  3. Ventolin via nebulizer q.4h. while awake for the next several days then     metered dose inhaler as needed.   Initial H&P: Please see H&P as dictated.   HOSPITAL COURSE:  This patient is a 60 year old white female with a history  of type 2 diabetes, hypertension, and obesity who presented to the hospital  on the day of admission with complaints of cough, shortness of breath, and  wheezing.   On exam she was felt to have clinically, probably, a pneumonia equivalent.  She was given IV Solu-Medrol due to significant wheezing along with  nebulizer treatments, Ventolin q.3h.  The patient was also given Levaquin  500 daily to cover any infectious component.  Her chronic medications were  maintained.   The patient remained somewhat tight for several days and had a bit of  hyperglycemic response as expected on the steroids.  This was managed with  sliding scale.  On the day of discharge the patient was feeling better.  Her  pulmonary exam had stabilized to the point where she was felt to be stable  to go home.  She was discharged with diagnosis and disposition as noted  above.                                               Donna Bernard, M.D.    Karie Chimera  D:  11/05/2002  T:   11/05/2002  Job:  [id number]

## 2010-09-05 NOTE — H&P (Signed)
NAMECORLEY, KOHLS                 ACCOUNT NO.:  [id number]   MEDICAL RECORD NO.:  [id number]          PATIENT TYPE:  INP   LOCATION:  A306                          FACILITY:  [facility]   PHYSICIAN:  Scott A. Gerda Diss, MD    DATE OF BIRTH:  March 03, 1951   DATE OF ADMISSION:  09/18/2004  DATE OF DISCHARGE:  LH                                HISTORY & PHYSICAL   CHIEF COMPLAINT:  Shortness of breath.   HISTORY OF PRESENT ILLNESS:  This is a 60 year old white female with  significant coughing, congestion, difficulty breathing.  She has been having  it going on for about one week.  She was seen a week ago.  Was treated with  a shot of Depo-Medrol and treated with antibiotics, given Biaxin at that  time, had a fever going on, relates over the next few days had increased  coughing, congestion, wheezing, shortness of breath, felt very ill.  Tried  to put up with it at home as best she could, but then presented to [place] today  on the first with cough, shortness of breath, intermittent fevers, slight  nausea, and occasional vomiting and significant coughing and DOE.   PAST MEDICAL HISTORY:  1.  Diabetes.  2.  Reactive airway.  3.  Hypertension.  4.  Obesity.  5.  Peripheral edema.  6.  History of polyps.  7.  Hyperlipidemia.   PAST SURGICAL HISTORY:  She has had her gallbladder, appendix removed.  She  has had a hernia repair and also esophageal stretching.   ALLERGIES:  She is allergic to PENICILLIN, but she has taken Keflex once  before according to the notes.   SOCIAL HISTORY:  She does smoke.  She is widowed.   CURRENT MEDICATIONS:  1.  Phenergan on a p.r.n. basis.  2.  Dyazide 37.5/25 one q.a.m.  3.  Glucophage 500 mg two in the morning, one in the afternoon and two in      the evening.  4.  Avandia 8 mg a day.  5.  Aciphex one daily.  6.  Paxil 40 mg daily.  7.  Reglan 5 mg q.a.c. and q.h.s.  8.  Temazepam 30 mg q.h.s.  9.  Lipitor 20 mg daily.  10. Humulin N 48 in the morning, 32  in the evening and also sliding scale of      Humulin Regular.   FAMILY HISTORY:  Colon cancer.   SOCIAL HISTORY:  Per above.   REVIEW OF SYSTEMS:  Per above.   PHYSICAL EXAMINATION:  GENERAL:  Looks to feel ill.  Short of breath after  walking and sitting down in exam room, but then her breathing calmed down  when she was at rest.  ____  CHEST:  Crackles.  Bilateral expiratory wheezes.  HEART:  Regular.  ABDOMEN:  Soft, obese.  EXTREMITIES:  Slight edema in the ankles.  SKIN:  Warm, dry.  NEUROLOGIC:  Grossly normal.   Her white count 10,000, hemoglobin 15.8, platelets 369, neutrophils 71.  Sodium 137, potassium 3.7, glucose 178, BUN 7, creatinine 0.9.  Chest x-ray:  Bronchial thickening.  O2 saturation in the mid-80s on room air.   ASSESSMENT AND PLAN:  1.  Reactive airway/asthma-steroids IV, frequent nebulizer treatments, O2      supplementation.  2.  Diabetes-subpar control.  Recommend that we get going with      supplementation on a sliding scale to help equalize this out.  3.  I have advised patient to move around as tolerated in the room to cut      down the risk of blood clots.  She is not bed ridden. I do not feel she      needs to be on Lovenox.       SAL/MEDQ  D:  09/19/2004  T:  09/19/2004  Job:  [id number]

## 2010-09-05 NOTE — H&P (Signed)
NAME:  Cindy, Kennedy                           ACCOUNT NO.:  [id number]   MEDICAL RECORD NO.:  [id number]                   PATIENT TYPE:  OUT   LOCATION:  RAD                                  FACILITY:  [facility]   PHYSICIAN:  Vickki Hearing, M.D.           DATE OF BIRTH:  1950/08/24   DATE OF ADMISSION:  08/06/2003  DATE OF DISCHARGE:  08/06/2003                                HISTORY & PHYSICAL   CHIEF COMPLAINT:  Right knee pain.   HISTORY OF PRESENT ILLNESS:  [name] is 60 years old.  [name] has had  gradual onset of right knee pain since late March of 2005 without any  associated trauma. The patient involves her right knee and has radiated  anteriorly and now includes the entire circumference of the knee.  [name]  denies any back, hip, thigh or leg pain to suggest any lumbar disease.   REVIEW OF SYSTEMS:  Does not shed any light on the pathology.   My initial impression was that [name] had a torn medial meniscus and some  arthritis of the knee which was shown on radiograph.  [name] was sent for an  MRI and started on Relafen 500 mg b.i.d.  Her MRI does show that [name] does  indeed have a torn medial meniscus, large joint effusion, Baker's cyst and  chondromalacia of the joint surfaces.   ALLERGIES:  [name] is allergic to PENICILLIN.   MEDICAL PROBLEMS:  Diabetes.   SURGERIES:  1. Appendectomy.  2. Cholecystectomy.  3. Tubal ligation.  4. Herniorrhaphy from abdominal hernia with 11 procedures.   CURRENT PHARMACY:  [facility].   MEDICATIONS:  1. Glucophage.  2. Dyazide.  3. Paxil.  4. Prevacid.  5. Lipitor.   FAMILY PHYSICIAN:  Dr. Donna Bernard.   FAMILY HISTORY:  Heart disease, arthritis, cancer.   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  [name] smokes one pack of  cigarettes per day.  Caffeine use is recorded as yes.   PHYSICAL EXAMINATION:  VITAL SIGNS:  Vital signs are weight 270, pulse 80,  respiratory rate 20.  GENERAL APPEARANCE:  [name] is  well-developed, well-nourished, grooming and  hygiene are normal.  [name] has obesity without deformity.  CARDIOVASCULAR:  On exam, normal pulse, temperature perfusion, without edema  or tenderness.  no swelling.  LYMPH:  Nodes deferred.  SKIN: Exam normal.  NEUROLOGIC/PSYCHE:  Exam normal.  Sensation is intact.  Reflexes are normal.  [name] is awake and alert. [name] is oriented x3.  Her mood and affect are normal.  EXTREMITIES:  The knee flexion of 120 degrees with further flexion limited  by leg morphology, and muscle strength is normal.  Knee is stable.  Meniscal  signs of tear include medial joint line pain and tenderness and a positive  McMurray sign.   DIAGNOSIS:  Medial meniscal tear and arthritis.   TREATMENT PLAN:  Arthroscopy of  the right knee, chondroplasty as needed.   [name] has given informed consent.  [name] does understand that [name] will have  difficulty with this knee because of the arthritis.   CPT code:  Planned procedure 29881.  ICD9 code 717.2, 715.16.   FOLLOWUP:  Scheduled for May 13.   SURGERY SCHEDULED FOR:  Aug 29, 2003.     ___________________________________________                                         Vickki Hearing, M.D.   SEH/MEDQ  D:  08/13/2003  T:  08/13/2003  Job:  [id number]   cc:   Vickki Hearing, M.D.  Fax: [phone]   Day Surgery   W. Simone Curia, M.D.  [address]  [place]  [place] [address]  Fax: [phone]

## 2010-09-05 NOTE — H&P (Signed)
   NAMESHARIECE, VIVEIROS                           ACCOUNT NO.:  [id number]   MEDICAL RECORD NO.:  [id number]                   PATIENT TYPE:  INP   LOCATION:  A301                                 FACILITY:  [facility]   PHYSICIAN:  Scott A. Gerda Diss, M.D.               DATE OF BIRTH:  06/17/1950   DATE OF ADMISSION:  09/29/2002  DATE OF DISCHARGE:                                HISTORY & PHYSICAL   CHIEF COMPLAINT:  Cough, wheezing.   HISTORY OF PRESENT ILLNESS:  This is a 60 year old white female who has  significant obesity who presents with congestion, cough, fever, chills,  symptoms been present over the past few days, worse today.  [place]  Came home early because of difficulty in breathing.  Has been giving  herself inhalers, been following her glucoses but they have gone up also  into the 200s.   PAST MEDICAL HISTORY:  1. NIDDM.  2. GERD.  3. GAD.  4. HTN.   FAMILY HISTORY:  Noncontributory.   SOCIAL HISTORY:  Lives with family.   ALLERGIES:  PENICILLIN.   REVIEW OF SYSTEMS:  Per above.   PHYSICAL EXAMINATION:  GENERAL:  NAD.  TMs _________.  NECK:  Supple.  CHEST:  Bilateral expiratory wheezes in respiratory distress despite  nebulizer treatments in the office.  HEART:  Regular.  ABDOMEN:  Soft.  EXTREMITIES:  No edema.  SKIN:  Warm, dry.   White cell count is 7,000.  Potassium 3.4.   Chest x-ray is pending.    ASSESSMENT/PLAN:  1. Bronchitis with early pneumonia, treat with Levaquin intravenous.  2. Reactive airway secondary to the above.  Solu-Medrol.  3. Diabetes - q.a.c. checks along with sliding scale.                                               Scott A. Gerda Diss, M.D.    SAL/MEDQ  D:  09/29/2002  T:  09/29/2002  Job:  [id number]

## 2010-09-05 NOTE — Discharge Summary (Signed)
   NAMELUISA, [name]                           ACCOUNT NO.:  [id number]   MEDICAL RECORD NO.:  [id number]                   PATIENT TYPE:  INP   LOCATION:  A314                                 FACILITY:  [facility]   PHYSICIAN:  Scott A. Gerda Diss, M.D.               DATE OF BIRTH:  May 26, 1950   DATE OF ADMISSION:  01/26/2002  DATE OF DISCHARGE:  01/30/2002                                 DISCHARGE SUMMARY   HOSPITAL COURSE:  The patient was admitted in with significant pain and  discomfort in the pelvic region on exam.  Was placed on Cipro and Flagyl and  pain medication and IV fluids.  The patient mildly febrile.  Improved  gradually.  It is felt she failed outpatient management.  At the time of  discharge the patient was stable.  Please see chart for the full details.  She was discharged home on medications Flagyl and Cipro and to follow up in  several days in the office.                                               Scott A. Gerda Diss, M.D.    SAL/MEDQ  D:  04/02/2002  T:  04/03/2002  Job:  [id number]

## 2010-09-05 NOTE — Op Note (Signed)
[name], [name]                 ACCOUNT NO.:  [id number]   MEDICAL RECORD NO.:  [id number]          PATIENT TYPE:  AMB   LOCATION:  DAY                           FACILITY:  [facility]   PHYSICIAN:  Jerolyn Shin C. Katrinka Blazing, M.D.   DATE OF BIRTH:  Mar 25, 1951   DATE OF PROCEDURE:  02/06/2005  DATE OF DISCHARGE:                                 OPERATIVE REPORT   PREOPERATIVE DIAGNOSIS:  Recurrent mass of vulva.   POSTOPERATIVE DIAGNOSIS:  Recurrent mass of vulva.   PROCEDURE:  Wide excision of mass of vulva, 4 x 5 with primary closure.   SURGEON:  Dr. Katrinka Blazing.   DESCRIPTION:  Under spinal anesthesia, the perineal and vulvar area were  prepped and draped in a sterile field. The margins around the mass were  marked. An elliptical incision was made around the mass. The incision was  taken full thickness into the deep subcutaneous tissue to make sure that all  involved tissue was removed. It was sent for pathologic evaluation.  Hemostasis was achieved with electrocautery. The deep tissues were then  closed with multiple layers of 2-0 Monocryl. The skin and subcutaneous  tissue was closed with 4-0 Vicryl. Local infiltration with 0.5% Marcaine  with epinephrine was carried out with 20 cc instilled. The patient tolerated  he procedure well. Dressing was placed. She was transferred to a bed and  taken to the post-anesthesia care unit for further monitoring.      Dirk Dress. Katrinka Blazing, M.D.  Electronically Signed     LCS/MEDQ  D:  02/06/2005  T:  02/06/2005  Job:  [id number]   cc:   Donna Bernard, M.D.  Fax: [phone]

## 2010-09-05 NOTE — Discharge Summary (Signed)
[facility]  Patient:    [name], [name] Visit Number: [id number] MRN: [id number]          Service Type: SUR Location: 2A A211 01 Attending Physician:  Dessa Phi Dictated by:   Elpidio Anis, M.D. Admit Date:  03/15/2001 Discharge Date: 03/16/2001   CC:         Donna Bernard, M.D.                           Discharge Summary  DISCHARGE DIAGNOSES: 1. 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 2. Non-insulin-dependent diabetes mellitus. 3. Hypertension. 4. Depression.  PROCEDURE:  Wide excision and closure of anterior abdominal wall.  DISPOSITION:  Discharged to home in stable, satisfactory condition.  DISCHARGE MEDICATIONS: 1. Levaquin 500 mg q.d. 2. Cleocin 300 mg q.i.d.  FOLLOWUP:  The patient is scheduled to be seen in the office on December 5.  HISTORY OF PRESENT ILLNESS:  This is a 60 year old female with history of incisional hernia repair with Marlex mesh on February 08, 2001.  The patient had necrosis of the periumbilical area.  The area needed to be revised because of exposed mesh.  PAST MEDICAL HISTORY: 1. Morbid obesity. 2. Non-insulin-dependent diabetes mellitus. 3. Hypertension. 4. Osteoarthritis. 5. Anxiety with depression. 6. Multiple incisional hernia repairs.  PHYSICAL EXAMINATION:  GENERAL:  She was obese, otherwise unremarkable.  VITAL SIGNS:  Afebrile.  ABDOMEN:  There was full thickness necrosis in the periumbilical area extending down to exposed mesh.  HOSPITAL COURSE:  The patient was admitted and taken to the operating room where examination under anesthesia revealed full thickness necrosis of anterior abdominal wall down to the mesh.  All necrotic tissue was removed and normal tissue was closed.  Full thickness with JP drain after the area was copiously flushed.  The patient had no problems in the perioperative period. She was continued on Cefotan and Cleocin postoperatively.  She did well  with no temperature elevation.  She was discharged home on the morning of postop day #1 in satisfactory condition. Dictated by:   Elpidio Anis, M.D. Attending Physician:  Dessa Phi DD:  04/19/01 TD:  04/19/01 Job: [id number]

## 2010-09-05 NOTE — Op Note (Signed)
NAME:  [name], [name]                           ACCOUNT NO.:  [id number]   MEDICAL RECORD NO.:  [id number]                   PATIENT TYPE:  AMB   LOCATION:  DAY                                  FACILITY:  [facility]   PHYSICIAN:  Vickki Hearing, M.D.           DATE OF BIRTH:  02-14-1951   DATE OF PROCEDURE:  08/15/2003  DATE OF DISCHARGE:                                 OPERATIVE REPORT   PREOPERATIVE DIAGNOSES:  1. Medial meniscal tear right knee.  2. Osteoarthritis right knee.   POSTOPERATIVE DIAGNOSES:  1. Medial meniscal tear right knee.  2. Osteoarthritis right knee.   PROCEDURE:  1. Arthroscopy.  2. Medial meniscectomy right knee.   SURGEON:  Vickki Hearing, M.D.   OPERATIVE FINDINGS:  Three-compartment arthritis, medial meniscal tear right  knee.  Compartments:  Medial compartment grade 1-2 chondromalacia, torn  posterior horn medial meniscus.  Patellofemoral joint grade 2 cartilage  changes.  Lateral compartment grade 2 cartilage changes, fraying of the free  edge of the lateral meniscus notch area.  ACL intact, PCL intact.  Gutters  were clean.  There was 1 loose body.  This appeared to be cartilaginous  fragment.   HISTORY:  A 60 year old female with gradually increasing pain in the right  knee progressed to the point where she had interference with activities of  daily living.   INDICATIONS:  Pain, unresponsive to nonoperative therapy.   DESCRIPTION OF PROCEDURE:  The patient was identified as [name].  She  marked her knee as the surgical site on the right.  I signed my initials  over her mark.  She was given Ancef, taken to the operating room for a  general anesthetic.  A time out was taken to confirm the patient's identity,  procedure and extremity.  Everyone agreed; we proceeded with sterile prep  and drape.   A 2-incision portal arthroscopy technique was noted.  Diagnostic arthroscopy  was performed.  All compartments were visualized and a probe  was used to  palpate the structures of the compartments.   Using a straight duckbill forceps the meniscal tear was resected.  It was  balanced with a straight aggressive meniscal shaver along with a 60-degree  ArthroWand.  The loose body was suctioned out of the joint.  The free edge  of the lateral meniscus was trimmed.  A chondroplasty was performed on each  condyle.  The knee was washed clean,  suctioned dry and closed with Steri-Strips.  We injected 30 cc of 1/2%  Sensorcaine.  We applied a CryoCuff with sterile dressing.  She was  extubated, taken to the recovery room in stable condition.  Follow up will  be in 1-2 days. She will have dressing change at that time.  Discharge on  Lorcet pain medication.      ___________________________________________  Vickki Hearing, M.D.   SEH/MEDQ  D:  08/15/2003  T:  03/30/202005  Job:  [id number]

## 2010-09-05 NOTE — Procedures (Signed)
[facility]  Patient:    [name], [name] Visit Number: [id number] MRN: [id number]          Service Type: Attending:  Kari Baars, M.D. Dictated by:   Kari Baars, M.D. Proc. Date: 02/02/01                            EKG Interpretations  EKG:  The rhythm is a sinus rhythm with a rate in the 80s.  There is possible left atrial enlargement.  There is also a pattern which is typical of chronic pulmonary disease.  Clinical correlation is suggested.  Abnormal electrocardiogram. Dictated by:   Kari Baars, M.D. Attending:  Kari Baars, M.D. DD:  02/02/01 TD:  02/03/01 Job: [id number]

## 2010-09-05 NOTE — Discharge Summary (Signed)
[facility]  Patient:    [name], [name] Visit Number: [id number] MRN: [id number]          Service Type: EMS Location: ED Attending Physician:  Cassell Smiles. Dictated by:   Elpidio Anis, M.D. Admit Date:  02/26/2001 Discharge Date: 02/26/2001                             Discharge Summary  DISCHARGE DIAGNOSES: 1. Recurrent incisional hernia. 2. History of colon polyps. 3. Hypertension. 4. Non-insulin dependent diabetes mellitus.  SPECIAL PROCEDURE:  Marlex mesh repair of recurrent incarcerated incisional hernia with extensive intestinal adhesiolysis February 08, 2001.  DISPOSITION:  The patient is discharged home in stable satisfactory condition.  DISCHARGE MEDICATIONS: 1. Levaquin 500 mg q.d. 2. Cleocin 300 mg q.i.d. 3. Tylox 2 every 4 hours as needed for pain. 4. Celebrex 200 mg q.d. 5. Glucophage 500 mg 2 b.i.d. 6. Avandia 8 mg q.d. 7. Zantac 150 mg b.i.d. 8. Paxil 40 mg q.h.s. 9. Dyazide 25mg  q.d.  FOLLOWUP:  The patient is scheduled to be seen in the office February 21, 2001.  BRIEF HISTORY:  A 60 year old female with history of multiple incisional hernias with a history of primary repair x 7 and mesh repair x 2.  PAST SURGICAL HISTORY:  Previous surgeries include: 1. Tubal ligation. 2. Cholecystectomy. 3. Appendectomy. 4. Exploratory laparotomy.  She was admitted through Day Surgery to have incisional hernia repair done. This was done on February 08, 2001.  At the time of the repair there was disruption of the right lateral border of the old mesh with the finding of no apparent sutures of the right lateral border to the muscle fascia but suture to the old scarred mesh that was previously secured in the subcutaneous fascia and fat.  No purulence was noted.  Marlex mesh repair of recurrent incarcerated hernia was done.  Extensive adhesiolysis had to be done.  The old mesh was removed.  The subcutaneous tissue was drained with 3 JP  drains.  The patient was monitored postoperatively and actually had a very good postoperative course.  There was no evidence of infection.  She did not have significant temperature elevation.  She was continued on antibiotics throughout her postoperative course.  JP drain prior to discharge was serous. She had no significant leukocytosis.  She remained stable and was discharged home on February 15, 2001, in satisfactory condition. Dictated by:   Elpidio Anis, M.D. Attending Physician:  Cassell Smiles DD:  03/10/01 TD:  03/11/01 Job: [id number]

## 2010-09-05 NOTE — Discharge Summary (Signed)
[name], [name]                 ACCOUNT NO.:  [id number]   MEDICAL RECORD NO.:  [id number]          PATIENT TYPE:  INP   LOCATION:  A330                          FACILITY:  [facility]   PHYSICIAN:  Donna Bernard, M.D.DATE OF BIRTH:  09/14/1950   DATE OF ADMISSION:  01/28/2007  DATE OF DISCHARGE:  10/13/2008LH                               DISCHARGE SUMMARY   FINAL DIAGNOSES:  1. Exacerbation of chronic obstructive pulmonary disease.  2. Reactive airways.  3. Hypoxemia secondary to 1.  4. Bronchitis.  5. Type 2 diabetes mellitus.   DISPOSITION:  1. Patient discharged to home.  2. Patient to follow up in the office in 1 week.  3. Discharge medications:  Maintain all chronic meds, plus increase      Lantus to 24 units q.h.s., until prednisone finished.  4. Take prednisone taper.  5. Albuterol plus Atrovent treatments q.i.d.  6. Levaquin 500 mg daily for seven more days.  7. Follow-up in the office in 1 week.   INITIAL HISTORY & PHYSICAL:  Please see H&P as dictated.   HOSPITAL COURSE:  This patient is a 60 year old white female with  history of type 2 diabetes, reactive airway COPD, and hypertension, who  came in for routine colonoscopy, was found to have significant problems  with wheezing and hypoxemia.  Dr. Lilyan Punt was called by the  gastroenterologist to admit her.  He brought her into the hospital.  Chest x-ray showed no pneumonia, pCO2 of 48, pO2 of 41 with an O2 sat of  77.  The patient was given nasal cannula O2 supplement.  She was given  Levaquin IV, along with Solu-Medrol IV. Frequent neb treatments were  administered.  Over the next several days, she gradually improved.  On  the day of discharge, she was able to maintain oxygenation without  supplementation.  Her wheezes improved considerably.  She was discharged  home with diagnosis and disposition as noted above.      Donna Bernard, M.D.  Electronically Signed     WSL/MEDQ  D:  02/28/2007  T:   02/28/2007  Job:  [id number]

## 2010-09-05 NOTE — H&P (Signed)
[name], [name]                 ACCOUNT NO.:  [id number]   MEDICAL RECORD NO.:  [id number]          PATIENT TYPE:  AMB   LOCATION:  DAY                           FACILITY:  [facility]   PHYSICIAN:  Jerolyn Shin C. Katrinka Blazing, M.D.   DATE OF BIRTH:  Nov 11, 1950   DATE OF ADMISSION:  DATE OF DISCHARGE:  LH                                HISTORY & PHYSICAL   HISTORY OF PRESENT ILLNESS:  This is a 60 year old female with a history of  a recurrent right labial cyst.  She has had I&D which was last done in  January of 2006.  She has had flares monthly.  She is scheduled to have the  mass widely excised and primarily closed to try to abort the recurrences.   PAST HISTORY:  1.  She has diabetes mellitus.  2.  Hypertension.  3.  Osteoarthritis.  4.  Depression with anxiety.   SURGERY:  1 . Appendectomy.  1.  Cholecystectomy.  2.  Tubal ligation.  3.  Right knee arthroplasty.  4.  Multiple hernia repairs with mesh.   MEDICATIONS:  1.  Dyazide one daily.  2.  Paxil 20 mg daily.  3.  Glucophage 500 mg 2 in the morning, 1 at noon, and 2 at bedtime.  4.  Lipitor 10 mg nightly.  5.  Humulin N 34 units q.a.m. and 27 units q.p.m.  6.  Prilosec 30 mg daily.  7.  Reglan 10 mg before meals and at bedtime.   ALLERGIES:  PENICILLIN.   PHYSICAL EXAMINATION:  VITAL SIGNS:  Blood pressure of 146/88, pulse 80,  respirations 20, weight 249 pounds.  HEENT:  Unremarkable.  NECK:  Supple.  No JVD, bruit, adenopathy, or thyromegaly.  CHEST:  Clear to auscultation.  HEART:  Regular rate and rhythm without murmur, gallop, or rub.  ABDOMEN:  Soft, nontender.  No masses.  PELVIC:  Pubic area reveals a chronic scarred inflammatory mass that is in  the right lateral aspect of the mons pubis with tenderness.  No purulence  noted.  Moderate deep subcutaneous induration.  NEUROLOGIC:  No focal motor, sensory, or cerebellar deficit.  Inguinal exam  reveals no adenopathy.  EXTREMITIES:  No clubbing, cyanosis, or edema.   IMPRESSION:  1.  Recurrent inflammatory mass or the right mons pubis.  2.  Diabetes mellitus.  3.  Hypertension.  4.  Osteoarthritis.  5.  Depression with anxiety.   PLAN:  Wide excision under anesthesia with primary closure of mass of the  vulva.      Dirk Dress. Katrinka Blazing, M.D.  Electronically Signed     LCS/MEDQ  D:  02/05/2005  T:  02/06/2005  Job:  [id number]

## 2010-09-05 NOTE — Discharge Summary (Signed)
[name], [name]                 ACCOUNT NO.:  [id number]   MEDICAL RECORD NO.:  [id number]          PATIENT TYPE:  INP   LOCATION:  A306                          FACILITY:  [facility]   PHYSICIAN:  Donna Bernard, M.D.DATE OF BIRTH:  [birth year]   DATE OF ADMISSION:  09/18/2004  DATE OF DISCHARGE:  06/04/2006LH                                 DISCHARGE SUMMARY   DISCHARGE DIAGNOSES:  1.  Exacerbation of asthma.  2.  Flare of type 2 diabetes.  3.  Hypoxia secondary to #1.   DISPOSITION:  The patient is discharged to home.   SPECIAL INSTRUCTIONS:  The patient encouraged to stop smoking.   DISCHARGE MEDICATIONS:  1.  Levaquin 500 mg daily x7 days.  2.  Prednisone taper as directed.  3.  Dukes Magic Mouthwash swish and spit q.i.d.   FOLLOW UP:  Follow up in the office as scheduled.   HISTORY OF PRESENT ILLNESS:  Please see initial H&P as dictated.   HOSPITAL COURSE:  This patient is a 60 year old, white female with history  of cough and congestion who presented to the hospital with difficulty  breathing.  X-ray showed bronchial thickening with no true pneumonia.  O2  saturations revealed desaturation with O2 in the mid 80s on room air.  The  patient's glucose was also elevated.  The patient was placed in the  hospital.  She was started on IV antibiotics and steroids along with  frequent nebulizer treatments.  Nasal cannula was administered initially at  a higher rate of 4 L.  Sliding scale of insulin was administered to augment  the patient's elevated glucose levels.  Glucoses were felt to be up  primarily due to therapy.  Over the next several days, the patient gradually  improved.  The day prior to discharge, her oxygen was cut down  significantly.  On the day of discharge, the patient was feeling better and  discharged home with diagnoses and disposition as noted above.       WSL/MEDQ  D:  10/22/2004  T:  10/22/2004  Job:  [id number]

## 2010-09-05 NOTE — Consult Note (Signed)
[facility]  Patient:    [name], [name] Visit Number: [id number] MRN: [id number]          Service Type: MED Location: 3A A311 01 Attending Physician:  Dessa Phi Dictated by:   Joneen Caraway, M.D. Proc. Date: 02/08/01 Admit Date:  02/08/2001 Discharge Date: 02/14/2001                            Consultation Report  SUBJECTIVE:  The patient is a 60 year old white female with history of type 2 diabetes, hypertension, and reactive airway disease, operated on today for a recurrent incisional hernia.  The patient has been in fairly good state of health in regards to her chronic conditions.  She claims compliance with her usual medications.  MEDICATIONS: 1. Celebrex 200 mg p.r.n. 2. Diazide 37.5/25 one q.d. 3. Glucophage 1000 mg twice per day. 4. Avandia 8 mg daily. 5. Paxil 20 mg q.h.s. 6. Zantac 150 mg b.i.d.  PHYSICAL EXAMINATION:  GENERAL:  The patient apparently handled the surgery well.  The patient is alert, drowsy, not in distress.  VITAL SIGNS:  Stable.  Blood pressure 130/80.  Accu-Chek 128.  LUNGS:  Clear.  No wheezes.  HEART:  Regular rate and rhythm.  ABDOMEN:  Minimal bowel sounds evident.  EXTREMITIES:  Bilateral hose evident.  LABORATORY DATA:  Please see significant labs.  IMPRESSION: 1. Type 2 diabetes, stable. 2. Hypertension, stable. 3. History of reactive airway disease, clinically stable.  PLAN:  Will maintain usual oral medications.  Will stay with sliding scale as prescribed.  Will monitor sugars closely.  Further orders as noted in the chart. Dictated by:   Joneen Caraway, M.D. Attending Physician:  Dessa Phi DD:  02/08/01 TD:  02/09/01 Job: [id number]

## 2010-09-05 NOTE — Op Note (Signed)
[facility]  Patient:    [name], [name] Visit Number: [id number] MRN: [id number]          Service Type: EMS Location: ED Attending Physician:  Cassell Smiles. Dictated by:   Elpidio Anis, M.D. Proc. Date: 02/08/01 Admit Date:  02/26/2001 Discharge Date: 02/26/2001                             Operative Report  PREOPERATIVE DIAGNOSIS:  Recurrent incisional hernia.  POSTOPERATIVE DIAGNOSIS:  Recurrent incisional hernia with extensive intra-abdominal adhesions.  PROCEDURE:  Marlex mesh repair of recurrent incarcerated incisional hernia and extensive intestinal adhesiolysis.  SURGEON:  Elpidio Anis, M.D.  ANESTHESIA:  General anesthesia.  DESCRIPTION OF PROCEDURE:  Under general anesthesia, the patients abdomen was prepped and draped in a sterile field.  The old midline incision was removed. Exploration in the subcutaneous tissue revealed disruption of the right lateral border of the mesh with no evidence of attachment of the mesh to the muscle or fascia.  There was another piece of old mesh that had been previously secured.  All of the old mesh was removed.  The attenuated muscle and fascia were outlined.  The muscle and fascia were then enveloped in mesh using through-and-through sutures in two rows on each side.  Extensive small bowel adhesiolysis was carried out in order to reduce the small bowel back into the peritoneal cavity.  Next, the new mesh was closed in the midline at two levels using running #1 Prolene.  Three JP drains were placed over the mesh and the subcutaneous tissue was closed using 2-0 Biosyn in the subcutaneous tissue and staples on the skin.  Drains were secured with 3-0 nylon.  Patient tolerated the procedure well.  A sterile dressing was placed.  She was awakened from anesthesia and transferred to a bed and taken to the postanesthetic care unit. Dictated by:   Elpidio Anis, M.D. Attending Physician:  Cassell Smiles DD:  03/10/01 TD:  03/11/01 Job: [id number]

## 2011-01-04 ENCOUNTER — Emergency Department (HOSPITAL_COMMUNITY): Payer: Self-pay

## 2011-01-04 ENCOUNTER — Encounter: Payer: Self-pay | Admitting: Internal Medicine

## 2011-01-04 ENCOUNTER — Inpatient Hospital Stay (HOSPITAL_COMMUNITY)
Admission: EM | Admit: 2011-01-04 | Discharge: 2011-01-05 | DRG: 390 | Disposition: A | Discharge status: Home / Self Care | Payer: Self-pay | Attending: Internal Medicine | Admitting: Internal Medicine

## 2011-01-04 DIAGNOSIS — R112 Nausea with vomiting, unspecified: Secondary | ICD-10-CM

## 2011-01-04 DIAGNOSIS — K5289 Other specified noninfective gastroenteritis and colitis: Secondary | ICD-10-CM

## 2011-01-04 DIAGNOSIS — J449 Chronic obstructive pulmonary disease, unspecified: Secondary | ICD-10-CM | POA: Diagnosis present

## 2011-01-04 DIAGNOSIS — F17200 Nicotine dependence, unspecified, uncomplicated: Secondary | ICD-10-CM | POA: Diagnosis present

## 2011-01-04 DIAGNOSIS — E119 Type 2 diabetes mellitus without complications: Secondary | ICD-10-CM | POA: Diagnosis present

## 2011-01-04 DIAGNOSIS — F3289 Other specified depressive episodes: Secondary | ICD-10-CM | POA: Diagnosis present

## 2011-01-04 DIAGNOSIS — K565 Intestinal adhesions [bands], unspecified as to partial versus complete obstruction: Principal | ICD-10-CM | POA: Diagnosis present

## 2011-01-04 DIAGNOSIS — E785 Hyperlipidemia, unspecified: Secondary | ICD-10-CM | POA: Diagnosis present

## 2011-01-04 DIAGNOSIS — E669 Obesity, unspecified: Secondary | ICD-10-CM | POA: Diagnosis present

## 2011-01-04 DIAGNOSIS — F329 Major depressive disorder, single episode, unspecified: Secondary | ICD-10-CM | POA: Diagnosis present

## 2011-01-04 DIAGNOSIS — K56609 Unspecified intestinal obstruction, unspecified as to partial versus complete obstruction: Secondary | ICD-10-CM

## 2011-01-04 DIAGNOSIS — J4489 Other specified chronic obstructive pulmonary disease: Secondary | ICD-10-CM | POA: Diagnosis present

## 2011-01-04 DIAGNOSIS — I10 Essential (primary) hypertension: Secondary | ICD-10-CM | POA: Diagnosis present

## 2011-01-04 LAB — URINALYSIS, ROUTINE W REFLEX MICROSCOPIC
Glucose, UA: 100 mg/dL — AB
Hgb urine dipstick: NEGATIVE
Specific Gravity, Urine: 1.024 (ref 1.005–1.030)
Urobilinogen, UA: 0.2 mg/dL (ref 0.0–1.0)
pH: 5.5 (ref 5.0–8.0)

## 2011-01-04 LAB — CBC
MCHC: 34.8 g/dL (ref 30.0–36.0)
Platelets: 261 10*3/uL (ref 150–400)
RDW: 13.6 % (ref 11.5–15.5)
WBC: 12.8 10*3/uL — ABNORMAL HIGH (ref 4.0–10.5)

## 2011-01-04 LAB — COMPREHENSIVE METABOLIC PANEL
ALT: 21 U/L (ref 0–35)
AST: 18 U/L (ref 0–37)
Albumin: 3.3 g/dL — ABNORMAL LOW (ref 3.5–5.2)
Calcium: 9.1 mg/dL (ref 8.4–10.5)
GFR calc Af Amer: >60 mL/min (ref >60)
Sodium: 133 mEq/L — ABNORMAL LOW (ref 135–145)
Total Protein: 6.7 g/dL (ref 6.0–8.3)

## 2011-01-04 LAB — GLUCOSE, CAPILLARY: Glucose-Capillary: 166 mg/dL — ABNORMAL HIGH (ref 70–99)

## 2011-01-04 LAB — DIFFERENTIAL
Basophils Absolute: 0 10*3/uL (ref 0.0–0.1)
Basophils Relative: 0 % (ref 0–1)
Eosinophils Relative: 1 % (ref 0–5)
Monocytes Absolute: 0.6 10*3/uL (ref 0.1–1.0)

## 2011-01-04 LAB — LACTIC ACID, PLASMA: Lactic Acid, Venous: 1.4 mmol/L (ref 0.5–2.2)

## 2011-01-04 MED ORDER — IOHEXOL 300 MG/ML  SOLN: 140.0000 mL | Freq: Once | INTRAMUSCULAR | Status: AC | PRN

## 2011-01-04 NOTE — H&P (Unsigned)
Hospital Admission Note Date: 01/04/2011  Patient name: [name] Medical record number: [id number] Date of birth: 1951-01-31 Age: 60 y.o. Gender: female  PCP: Dr. Kennis Carina at [facility]  Attending physician: Dr. Doneen Poisson     Pager: Resident [phone]): Dr. Loistine Chance                        Pager:[phone] Resident (R1):  Dr. Clyde Lundborg    Pager: [phone]   Chief Complaint: Abdominal pain  History of Present Illness:  Patient is a 60 yo lady with PMH of s/p of ventral hernia surgery, DM-II, COPD and HTN,  who presents with abdominal pain for 2 days. Per patient, her abdominal pain started 2 days ago. At the beginning, the pain is 3/10 and is gradually getting worse. After 2 to 3 hours, it reached up to 9/10 in severity. The pain is constant, the severity has been fluctuating between 3 to 9 out of 10. The pain is sharp,  located at the LLQ, non-radiating to any where. The pain is alleviated by laying down and aggravated by moving. Patient did not have nausea and vomiting until yesterday. Yesterday, she had severe nausea and vomited up some yellow-colored the fluid, no blood in it. Patient denies fever, chill, chest pain, dysuria, diarrhea or constipation. Patient had a normal bowel movement at early morning and still pass gas. Patient reports that she burped a lot.   Of note, patient had significant surgery history in the past, including cholecystectomy, appendectomy, tuble ligation and 13 ventral hernia repaire surgeries. Patient also reports that she had PNA and received antibiotics treatment 3 weeks ago.    Allergies: Penicillin: rashes and itchy Codeine: rashes and itchy  Home Medications:  Enalapril 20 mg PO daily Symbicort (Budesonide/Formoterol): 160/4.5 Mcg, inhale, 2 puffs, twice daily Albuterol inhaler: 90 Mcg 2 puffs, prn  Glipizide: 5 mg PO twice daily Metoformin: 1000 mg, PO twice daily   Family Hx: mother died of colon cancer at age of 58; father has heart attach and s/p of  stent.  Social Hx: patient married and is living with her husband. She had one son and one daughter who are healthy. patient is working at [employer]. She smokes 0.5 PAD for 30 years, not drinking, no drug use.   Insurance: no  Review of Systems:  General: no fevers, chills, changes in weight, changes in appetite Skin: no rash HEENT: no blurry vision, hearing changes, sore throat Pulm: no dyspnea, coughing, wheezing CV: no chest pain, palpitations, shortness of breath GNF:AOZHYQMVH pain, nausea/vomiting. No diarrhea/constipation GU: no dysuria, hematuria, polyuria Ext: no arthralgias, myalgias Neuro: no weakness, numbness, or tingling  Physical Exam:  Vitals: T:98.1   HR:116   BP:139/68   RR:20   O2 saturation:97 ra  General: not in acute distress.  HEENT: PERRL, EOMI, no scleral icterus Cardiac: RRR, no rubs, murmurs or gallops Pulm: clear to auscultation bilaterally, moving normal volumes of air.  Abd: soft, non distended, BS is active with occasionally high pitched bowel sound.             Diffusely tender, worse on the LLQ and worst on the central area of abdomen.             Questionable rebound pain at the central area. Murphy's sign negative. Surgical scars are also seen at the abdominal wall.  Ext: warm and well perfused, no pedal edema Neuro: alert and oriented X3, cranial nerves II-XII grossly intact, strength and sensation  to light touch equal in bilateral upper and lower extremities  Lab results: Admission on 01/04/2011  Component Date Value Range Status  . Neutrophils Relative (%) 01/04/2011 75  43-77 Final  . Neutro Abs (K/uL) 01/04/2011 9.6* 1.7-7.7 Final  . Lymphocytes Relative (%) 01/04/2011 19  12-46 Final  . Lymphs Abs (K/uL) 01/04/2011 2.5  0.7-4.0 Final  . Monocytes Relative (%) 01/04/2011 5  3-12 Final  . Monocytes Absolute (K/uL) 01/04/2011 0.6  0.1-1.0 Final  . Eosinophils Relative (%) 01/04/2011 1  0-5 Final  . Eosinophils Absolute (K/uL) 01/04/2011 0.1   0.0-0.7 Final  . Basophils Relative (%) 01/04/2011 0  0-1 Final  . Basophils Absolute (K/uL) 01/04/2011 0.0  0.0-0.1 Final  . WBC (K/uL) 01/04/2011 12.8* 4.0-10.5 Final  . RBC (MIL/uL) 01/04/2011 5.51* 3.87-5.11 Final  . Hemoglobin (g/dL) 16/01/9603 54.0* 98.1-19.1 Final  . HCT (%) 01/04/2011 47.4* 36.0-46.0 Final  . MCV (fL) 01/04/2011 86.0  78.0-100.0 Final  . MCH (pg) 01/04/2011 29.9  26.0-34.0 Final  . MCHC (g/dL) 47/82/9562 13.0  86.5-78.4 Final  . RDW (%) 01/04/2011 13.6  11.5-15.5 Final  . Platelets (K/uL) 01/04/2011 261  150-400 Final  . Lactic Acid, Venous (mmol/L) 01/04/2011 1.4  0.5-2.2 Final  . Sodium (mEq/L) 01/04/2011 133* 135-145 Final  . Potassium (mEq/L) 01/04/2011 4.5  3.5-5.1 Final  . Chloride (mEq/L) 01/04/2011 96  96-112 Final  . CO2 (mEq/L) 01/04/2011 26  19-32 Final  . Glucose, Bld (mg/dL) 69/62/9528 413* 24-40 Final  . BUN (mg/dL) 02/14/2535 17  6-44 Final  . Creatinine, Ser (mg/dL) 03/47/4259 5.63  8.75-6.43 Final  . Calcium (mg/dL) 32/95/1884 9.1  1.6-60.6 Final  . Total Protein (g/dL) 30/16/0109 6.7  3.2-3.5 Final  . Albumin (g/dL) 57/32/2025 3.3* 4.2-7.0 Final  . AST (U/L) 01/04/2011 18  0-37 Final   HEMOLYSIS AT THIS LEVEL MAY AFFECT RESULT  . ALT (U/L) 01/04/2011 21  0-35 Final  . Alkaline Phosphatase (U/L) 01/04/2011 61  39-117 Final  . Total Bilirubin (mg/dL) 62/37/6283 0.5  1.5-1.7 Final  . GFR calc non Af Amer (mL/min) 01/04/2011 >60  >60 Final  . GFR calc Af Amer (mL/min) 01/04/2011 >60  >60 Final               . Lipase (U/L) 01/04/2011 30  11-59 Final  . Color, Urine  01/04/2011 YELLOW  YELLOW Final  . Appearance  01/04/2011 CLOUDY* CLEAR Final  . Specific Gravity, Urine  01/04/2011 1.024  1.005-1.030 Final  . pH  01/04/2011 5.5  5.0-8.0 Final  . Glucose, UA (mg/dL) 61/60/7371 062* NEGATIVE Final  . Hgb urine dipstick  01/04/2011 NEGATIVE  NEGATIVE Final  . Bilirubin Urine  01/04/2011 NEGATIVE  NEGATIVE Final  . Ketones, ur (mg/dL)  69/48/5462 NEGATIVE  NEGATIVE Final  . Protein, ur (mg/dL) 70/35/0093 NEGATIVE  NEGATIVE Final  . Urobilinogen, UA (mg/dL) 81/82/9937 0.2  1.6-9.6 Final  . Nitrite  01/04/2011 NEGATIVE  NEGATIVE Final  . Leukocytes, UA  01/04/2011 NEGATIVE  NEGATIVE Final   Imaging results:   CT ABDOMEN AND PELVIS WITH CONTRAST: Dilated loops of proximal small bowel in the left mid abdomen,  gradually narrowing in caliber in the right lower abdomen. Early/partial small bowel obstruction is suspected. Also seen on CT scan is  Small bowel anastomosis in the anterior mid abdomen. And Tiny recurrent right lateral small bowel hernia.  Chest X-ray:  No evidence of acute cardiopulmonary disease.  Assessment & Plan by Problem:  1. 60 yo lady with PMH significant for multiple  abdominal surgeries and recent antibiotics use, who presents with abdominal pain, nausea and vomiting. The Diagnosis is small bowel obstruction, likely due to inntra-abdominal adhesion. Other DD could includes: C. Diff colitis, given that patient used antibiotics recently (less likely due to lack of diarrhea); Pancreatitis (less likely, lipase is normal); UTI and  kidney stone (less likely, given normal UA and lack of dysuria). Patient had cholecystectomy and appendectomy which rule out these possibles.   Plan: Will put patient NPO and give IV fluid at 125 cc/hour, Zofran for nausea and morphin for pain. If patient's condition gets worse, will put NG Tube and consult general surgeon. Will check C. Diff (wall thickness of colon on the CT) Will check CBC and BMP in AM Lab  2. HTN: will hold her home medications and give IV hydralazine prn to control Bp until end of NPO 3. COPD: will start her home medication including Albuterol inhaler and Symbicort 4. DVT PPX: lovenox.

## 2011-01-05 ENCOUNTER — Inpatient Hospital Stay (HOSPITAL_COMMUNITY): Payer: Self-pay

## 2011-01-05 DIAGNOSIS — K56609 Unspecified intestinal obstruction, unspecified as to partial versus complete obstruction: Secondary | ICD-10-CM

## 2011-01-05 LAB — GLUCOSE, CAPILLARY
Glucose-Capillary: 172 mg/dL — ABNORMAL HIGH (ref 70–99)
Glucose-Capillary: 202 mg/dL — ABNORMAL HIGH (ref 70–99)
Glucose-Capillary: 217 mg/dL — ABNORMAL HIGH (ref 70–99)

## 2011-01-05 LAB — BASIC METABOLIC PANEL
CO2: 29 mEq/L (ref 19–32)
Calcium: 8 mg/dL — ABNORMAL LOW (ref 8.4–10.5)
Creatinine, Ser: 0.54 mg/dL (ref 0.50–1.10)
Glucose, Bld: 159 mg/dL — ABNORMAL HIGH (ref 70–99)

## 2011-01-05 LAB — CBC
Hemoglobin: 13.3 g/dL (ref 12.0–15.0)
MCH: 29.4 pg (ref 26.0–34.0)
MCHC: 33.8 g/dL (ref 30.0–36.0)

## 2011-01-11 NOTE — Discharge Summary (Signed)
NAMEZANITA, [name] NO.:  [id number]  MEDICAL RECORD NO.:  [id number]  LOCATION:  5030                         FACILITY:  [facility]  PHYSICIAN:  Doneen Poisson, MD     DATE OF BIRTH:  12-01-1950  DATE OF ADMISSION:  01/04/2011 DATE OF DISCHARGE:  01/05/2011                              DISCHARGE SUMMARY   DISCHARGE DIAGNOSES: 1. Small bowel obstruction. 2. Hypertension. 3. Chronic obstructive pulmonary disease.  DISCHARGE MEDICATIONS: 1. Phenergan 12.5 mg p.o. q.4 h. as needed for nausea. 2. Albuterol inhaler 90 mcg 2 puffs inhaled q.6 h. p.r.n. for     shortness of breath. 3. Enalapril 20 mg 1 tablet p.o. daily. 4. Glipizide 5 mg 1 tablet p.o. twice daily. 5. Metformin 100 mg 2 tablets twice daily p.o. 6. Symbicort 160/4.5 mcg 2 puffs inhaled twice daily.  DISPOSITION AND FOLLOWUP:    [name] is discharged home.  She has an appointment with Dr.  Allena Katz in the [facility] on January 16, 2011, at  9:15 a.m.  Please check if her abdominal pain has completely  resolved.  PROCEDURE PERFORMED: 1. CT abdomen and pelvis with contrast:  Dilated loops of proximal     small bowel in the left mid abdomen, suggesting hernia/partial     small bowel obstruction.  Small bowel anastomosis in the anterior     mid abdomen is also seen on the CT scan.  2. Chest x-ray:  No evidence of acute cardiopulmonary disease.  3. Abdomen x-ray:  Small bowel obstruction pattern, fairly high grade     but not complete.  No free air.  CONSULTATION:  None.  HISTORY OF PRESENT ILLNESS:    [name] is a 60 year old woman with a past medical history of  multiple abdominal surgeries, diabetes, COPD, and hypertension,  who presented with abdominal pain for 2 days.  In the beginning, the pain was 3/10 in severity and gradually got worse.  After 2-3 hours, the pain reached 9/10 in severity.  The pain was constant, sharp, located at left lower quadrant, and  nonradiating.  It was alleviated by laying down and aggravated by moving around, the  severity fluctuating between 3-9/10.  She did not have nausea  and vomiting until yesterday.  She had severe nausea and vomiting yesterday.  She denies fever, chills, chest pain, dysuria, diarrhea, or constipation.  She had a normal bowel movement this morning and still passes gas.  She also reports that she burped a lot.  Of note, she has a significant past surgical history including  cholecystectomy, appendectomy, tubal ligation, and 13 ventral hernia repairs.  She also reports that she had a pneumonia and  received antibiotic treatment 3 weeks ago.  PHYSICAL EXAMINATION:    VITAL SIGNS:  Temperature 98.1, blood pressure 139/68, heart rate 116,  respiration rate 20, oxygen saturation 97 at room air. GENERAL:  No acute distress. HEENT:  PERRLA. NECK:  Supple.  No JVD or bruits. HEART:  S1/S2, regular rhythm and rate, no murmurs or gallops. LUNGS:  Clear to auscultation bilaterally.  There are no rales, rhonchi, or rubs. ABDOMEN:  Soft, nondistended, bowel sound are active with occasionally  high-pitched bowel sounds.  Diffusely tender, worse on the left lower quadrant and central area of abdomen.  Questionable rebound pain at central area.  Murphy'S sign negative.  Surgical scars are seen on the abdominal wall. NEUROLOGIC:  Alert and oriented x 3.  Cranial nerves II through XII grossly  intact.  Muscle strength and sensation to light touch are normal.   LABORATORY DATA: WBC count 12.8, hemoglobin 16.5, hematocrit 47.4, platelets 261.   Sodium 133, potassium 4.5, chloride 96, bicarbonate 26, BUN 17, creatinine  0.75, glucose 226. total bilirubin 0.5, ALP 61, AST 18, ALT 21, protein  6.7, albumin 3.3. Lipase: 30.   Urinalysis: negative for nitrites and leukocyte.  HOSPITAL COURSE BY PROBLEM:  1. Small bowel obstruction, most likely due to intra-abdominal     adhesions secondary to her past  multiple abdominal surgeries.  She     was made n.p.o., and treated with IV fluids, Zofran for nausea, and     morphine for pain.  Overnight, her symptoms improved dramatically.     The following morning, she did not have nausea, vomiting, and     only had very mild abdominal pain.  She had 2 bowel movements and     passed a lot of gas.  She tolerated a full liquid diet for     lunch.  Her condition improved further in the afternoon.  She      was instructed to eat a full liquid diet for dinner.  If tolerated,      then she could advance to a normal diet the next day.  She was      also instructed to return to the hospital immediately if the abdominal     pain were to return.  2. Hypertension:  During her stay we controlled her blood pressure with      IV hydralazine given her  n.p.o. status. Her blood pressure was 106/55      at discharge.  DISCHARGE VITAL SIGNS:  Temperature 98.4, blood pressure 106/55, heart rate 85, respiration rate 16, oxygen saturation 94% on 2 L of oxygen.  DISCHARGE LABORATORY DATA:  WBC count 7.2, hemoglobin 13.3, hematocrit  39.3, platelets 201.  Sodium 137, potassium 3.8, chloride 102, bicarbonate  29, BUN 10, creatinine 0.54, glucose 159.   ______________________________ Lorretta Harp, MD   ______________________________ Doneen Poisson, MD   XN/MEDQ  D:  01/06/2011  T:  01/06/2011  Job:  [id number]  cc:   Doran Durand, MD  Electronically Signed by Lorretta Harp MD on 01/10/2011 06:06:15 PM Electronically Signed by Doneen Poisson  on 01/11/2011 01:50:17 PM

## 2011-01-16 ENCOUNTER — Encounter: Payer: Self-pay | Admitting: Internal Medicine

## 2011-01-29 LAB — BASIC METABOLIC PANEL
BUN: 5 — ABNORMAL LOW
BUN: 7
CO2: 33 — ABNORMAL HIGH
Calcium: 8.7
Chloride: 98
Creatinine, Ser: 0.78
Creatinine, Ser: 0.88
GFR calc Af Amer: >60
GFR calc non Af Amer: >60
Glucose, Bld: 151 — ABNORMAL HIGH
Glucose, Bld: 273 — ABNORMAL HIGH

## 2011-01-29 LAB — DIFFERENTIAL
Basophils Absolute: 0
Basophils Relative: 0
Neutro Abs: 7
Neutrophils Relative %: 84 — ABNORMAL HIGH

## 2011-01-29 LAB — BLOOD GAS, ARTERIAL
Acid-Base Excess: 2.1 — ABNORMAL HIGH
Acid-base deficit: 0.1
Bicarbonate: 24
FIO2: 21
O2 Saturation: 77.1
O2 Saturation: 87
Patient temperature: 37
Patient temperature: 37
pCO2 arterial: 48.5 — ABNORMAL HIGH
pH, Arterial: 7.34 — ABNORMAL LOW

## 2011-01-29 LAB — CBC
MCHC: 33.2
Platelets: 221
RDW: 14.9 — ABNORMAL HIGH

## 2011-01-29 NOTE — Consult Note (Signed)
Cindy, Kennedy NO.:  [id number]  MEDICAL RECORD NO.:  [id number]  LOCATION:  [facility]                         FACILITY:  [facility]  PHYSICIAN:  Cindy Sella. Andrey Campanile, MD     DATE OF BIRTH:  January 01, 1951  DATE OF CONSULTATION:  01/04/2011 DATE OF DISCHARGE:                                CONSULTATION   PRIMARY CARE PHYSICIAN:  Cindy A. Gerda Diss, MD  REQUESTING PHYSICIAN:  Cindy Nay, MD  REASON FOR CONSULTATION:  Partial small-bowel obstruction.  CHIEF COMPLAINT:  Abdominal pain.  HISTORY OF PRESENT ILLNESS:  [name] is a 60 year old morbidly obese Caucasian female who has had "13" prior abdominal wall hernia repairs who comes into the emergency room complaining of constant sharp abdominal pain since Friday.  It has been associated with nausea and vomiting x4.  She denies any fevers or chills.  She is still having flatus and she had a bowel movement today.  However, she is burping quite often.  She reports similar episode in the past with her hernias. She denies any diarrhea.  Interestingly, she has been on doxycycline for 10 days for community-acquired pneumonia.  She denies any dysuria, melena, hematochezia, weight change.  She does have COPD and uses inhalers on a daily basis.  She does have dyspnea on exertion and she has had more recent shortness of breath.  PAST MEDICAL HISTORY: 1. Morbid obesity. 2. Hypertension. 3. Diabetes mellitus. 4. Depression. 5. Hyperlipidemia. 6. Multiple incisional hernias. 7. Recurrent incisional hernia. 8. Colon polyps. 9. COPD.  PAST SURGICAL HISTORY: 1. Appendectomy. 2. Open cholecystectomy. 3. Right knee surgery. 4. Excision of vulvar mass. 5. Colonoscopy in 2008. 6. October 2002, Marlex mesh repair of recurrent incarcerated     incisional hernia and lysis of adhesions. 7. November 2002, wide local excision of necrotic abdominal skin and     subcutaneous fat with re-closure of abdominal wall over mesh.  By  report in the medical record, there is dictation stating that she has had 7 primary hernia repairs and 2 previous attempts of hernia repair with mesh prior to the repair in 2002.  ALLERGIES:  PENICILLIN.  MEDICATIONS:  Metformin, glipizide, albuterol, enalapril, Symbicort.  FAMILY HISTORY:  Noncontributory.  SOCIAL HISTORY:  She continues to smoke.  She denies any alcohol or drugs.  She lives with her husband.  REVIEW OF SYSTEMS:  She denies any chest pain, chest pressure, orthopnea, or paroxysmal nocturnal dyspnea.  Otherwise a comprehensive 12-point review of systems negative except what is mentioned in the HPI.  PHYSICAL EXAMINATION:  VITAL SIGNS:  Temperature 98.2, heart rate 88, blood pressure 112/65, respirations 20, saturating 99% on 2 L. GENERAL:  She is morbidly obese Caucasian female in no apparent distress.  She is slightly drowsy probably from narcotics. HEENT:  Atraumatic, normocephalic.  Pupils are equal.  No scleral icterus.  No periorbital ecchymosis or injection.  Trachea is midline. NECK:  Supple. PULMONARY:  Lungs are clear; however, she does have bilateral inspiratory and expiratory wheezes and no accessory use muscles for breathing. CARDIOVASCULAR:  Regular rate and rhythm, 2+ radial pulse. ABDOMEN:  Obese, soft.  There is no rebound.  There is no guarding.  She has bilateral upper abdominal tenderness.  She has a right subcostal incision and she has multiple midline incisions.  There is a recurrent incisional hernia slight to the right below umbilicus that is reducible. The defect is about 2 cm. MUSCULOSKELETAL:  Free range of motion, moves all extremities.  No obvious joint deformity. SKIN:  No jaundice, no rash, no edema. NEURO:  She is appropriate.  She falls asleep while I am talking to her, but she responds appropriately and is oriented x3. PSYCH:  Negative.  LABORATORY DATA:  Urinalysis negative.  Lipase 30, lactate 1.4.  Sodium 133, potassium 4.5,  chloride 96, 26, BUN 17, creatinine 0.75.  Blood sugar 226, calcium 9.1.  LFTs normal.  White count 12.8, hemoglobin 16.5, hematocrit 47.4, platelet count 261.  RADIOGRAPH: 1. Chest and pelvis, I reviewed myself.  There was dilated loops of     small bowel in the left midabdomen.  There was a short segment of     small bowel wall thickening just distal to the anastomosis.  There     is gradual narrowing of the caliber small bowel loops on right     lower quadrant.  There is no definitive transition.  There is urine     and stool in the colon.  There is a small recurrent incisional     hernia to the right of midline.  No signs of obstruction at the     recurrent hernia site.  IMPRESSION:  A 60 year old Caucasian female with, 1. Morbid obesity. 2. Tobacco abuse. 3. Diabetes mellitus, type 2. 4. Chronic obstructive pulmonary disease. 5. Hyperlipidemia. 6. Recurrent incisional hernia. 7. Depression. 8. Dehydration. 9. Enteritis versus small-bowel obstruction.  PLAN:  I think the 2 main things on the differential are either small bowel enteritis versus a partial small-bowel obstruction.  The patient is having flatus and having bowel movements.  There is no focal evidence of a transition point.  There is some wall thickening on the CT for short segment.  She has been treated on antibiotics for 10 days.  I think we should check a C. diff PCR, but I would do think the likelihood of her having C. diff infection would be unusual since she is not having diarrhea and the colon appears normal, but I do think we need to rule it out.  She appears to be dehydrated so I gave her IV fluids.  I let her do bowel rest.  I would hold off on a nasogastric tube for now. However, if she does vomit, we will place a nasogastric tube.  I would hold her antibiotics for now.  I believe her white count is mildly elevated secondary to her dehydration.  I would recommend checking two- view chest x-ray to  evaluate her lung pattern that.  The recurrent incisional hernia does not appear to be the source of obstruction. Hopefully this will resolve without surgical intervention.  The patient is not a candidate for elective incisional hernia repair in my opinion until she stops smoking and loses some weight.  If she was to have an elective incisional hernia repair, she would be high risk for recurrent or wound complications given her obesity, diabetes, and ongoing smoking considering the vast number of prior attempts she has had.  Currently, there is no indication for urgent surgical intervention.     Cindy Sella. Andrey Campanile, MD     EMW/MEDQ  D:  01/04/2011  T:  01/04/2011  Job:  [id number]  cc:  Cindy A. Gerda Diss, MD  Electronically Signed by Gaynelle Adu M.D. on 01/29/2011 12:23:56 PM

## 2011-08-25 ENCOUNTER — Encounter (HOSPITAL_COMMUNITY): Payer: Self-pay | Admitting: *Deleted

## 2011-08-25 ENCOUNTER — Emergency Department (HOSPITAL_COMMUNITY): Payer: Self-pay

## 2011-08-25 ENCOUNTER — Inpatient Hospital Stay (HOSPITAL_COMMUNITY)
Admission: EM | Admit: 2011-08-25 | Discharge: 2011-08-27 | DRG: 395 | Disposition: A | Discharge status: Home / Self Care | Payer: Self-pay | Attending: Internal Medicine | Admitting: Internal Medicine

## 2011-08-25 DIAGNOSIS — R0902 Hypoxemia: Secondary | ICD-10-CM

## 2011-08-25 DIAGNOSIS — M6530 Trigger finger, unspecified finger: Secondary | ICD-10-CM

## 2011-08-25 DIAGNOSIS — IMO0002 Reserved for concepts with insufficient information to code with codable children: Secondary | ICD-10-CM

## 2011-08-25 DIAGNOSIS — E119 Type 2 diabetes mellitus without complications: Secondary | ICD-10-CM

## 2011-08-25 DIAGNOSIS — D72829 Elevated white blood cell count, unspecified: Secondary | ICD-10-CM

## 2011-08-25 DIAGNOSIS — M1710 Unilateral primary osteoarthritis, unspecified knee: Secondary | ICD-10-CM | POA: Diagnosis present

## 2011-08-25 DIAGNOSIS — K56609 Unspecified intestinal obstruction, unspecified as to partial versus complete obstruction: Secondary | ICD-10-CM

## 2011-08-25 DIAGNOSIS — K430 Incisional hernia with obstruction, without gangrene: Secondary | ICD-10-CM

## 2011-08-25 DIAGNOSIS — Z880 Allergy status to penicillin: Secondary | ICD-10-CM

## 2011-08-25 DIAGNOSIS — Z6835 Body mass index (BMI) 35.0-35.9, adult: Secondary | ICD-10-CM

## 2011-08-25 DIAGNOSIS — K436 Other and unspecified ventral hernia with obstruction, without gangrene: Principal | ICD-10-CM | POA: Diagnosis present

## 2011-08-25 DIAGNOSIS — Z79899 Other long term (current) drug therapy: Secondary | ICD-10-CM

## 2011-08-25 DIAGNOSIS — M76829 Posterior tibial tendinitis, unspecified leg: Secondary | ICD-10-CM

## 2011-08-25 DIAGNOSIS — I10 Essential (primary) hypertension: Secondary | ICD-10-CM

## 2011-08-25 DIAGNOSIS — Z794 Long term (current) use of insulin: Secondary | ICD-10-CM

## 2011-08-25 DIAGNOSIS — J4489 Other specified chronic obstructive pulmonary disease: Secondary | ICD-10-CM | POA: Diagnosis present

## 2011-08-25 DIAGNOSIS — F17200 Nicotine dependence, unspecified, uncomplicated: Secondary | ICD-10-CM | POA: Diagnosis present

## 2011-08-25 DIAGNOSIS — J449 Chronic obstructive pulmonary disease, unspecified: Secondary | ICD-10-CM

## 2011-08-25 HISTORY — DX: Unspecified intestinal obstruction, unspecified as to partial versus complete obstruction: K56.609

## 2011-08-25 HISTORY — DX: Type 2 diabetes mellitus without complications: E11.9

## 2011-08-25 HISTORY — DX: Essential (primary) hypertension: I10

## 2011-08-25 HISTORY — DX: Unspecified osteoarthritis, unspecified site: M19.90

## 2011-08-25 HISTORY — DX: Pneumonia, unspecified organism: J18.9

## 2011-08-25 HISTORY — DX: Chronic obstructive pulmonary disease, unspecified: J44.9

## 2011-08-25 LAB — COMPREHENSIVE METABOLIC PANEL
Albumin: 4.2 g/dL (ref 3.5–5.2)
BUN: 19 mg/dL (ref 6–23)
Calcium: 10.4 mg/dL (ref 8.4–10.5)
Creatinine, Ser: 0.8 mg/dL (ref 0.50–1.10)
GFR calc Af Amer: >90 mL/min (ref >90)
Glucose, Bld: 266 mg/dL — ABNORMAL HIGH (ref 70–99)
Total Protein: 7.1 g/dL (ref 6.0–8.3)

## 2011-08-25 LAB — CBC
HCT: 49 % — ABNORMAL HIGH (ref 36.0–46.0)
Hemoglobin: 17.1 g/dL — ABNORMAL HIGH (ref 12.0–15.0)
MCH: 29.4 pg (ref 26.0–34.0)
MCHC: 34.9 g/dL (ref 30.0–36.0)
MCV: 84.2 fL (ref 78.0–100.0)
MCV: 84.5 fL (ref 78.0–100.0)
Platelets: 216 10*3/uL (ref 150–400)
RBC: 5.22 MIL/uL — ABNORMAL HIGH (ref 3.87–5.11)
RDW: 13.9 % (ref 11.5–15.5)
WBC: 14.3 10*3/uL — ABNORMAL HIGH (ref 4.0–10.5)

## 2011-08-25 LAB — DIFFERENTIAL
Basophils Relative: 0 % (ref 0–1)
Eosinophils Absolute: 0 10*3/uL (ref 0.0–0.7)
Eosinophils Relative: 0 % (ref 0–5)
Lymphs Abs: 0.9 10*3/uL (ref 0.7–4.0)
Monocytes Absolute: 0.5 10*3/uL (ref 0.1–1.0)
Monocytes Relative: 2 % — ABNORMAL LOW (ref 3–12)
Neutrophils Relative %: 93 % — ABNORMAL HIGH (ref 43–77)

## 2011-08-25 LAB — LACTIC ACID, PLASMA: Lactic Acid, Venous: 1.3 mmol/L (ref 0.5–2.2)

## 2011-08-25 LAB — CREATININE, SERUM
Creatinine, Ser: 0.76 mg/dL (ref 0.50–1.10)
GFR calc Af Amer: >90 mL/min (ref >90)

## 2011-08-25 LAB — LIPASE, BLOOD: Lipase: 51 U/L (ref 11–59)

## 2011-08-25 MED ORDER — ONDANSETRON HCL 4 MG PO TABS: 4.0000 mg | ORAL_TABLET | Freq: Four times a day (QID) | ORAL | Status: DC | PRN

## 2011-08-25 MED ORDER — SODIUM CHLORIDE 0.9 % IV SOLN
INTRAVENOUS | Status: DC
  Administered 2011-08-25 – 2011-08-27 (×4): via INTRAVENOUS

## 2011-08-25 MED ORDER — ALBUTEROL SULFATE (5 MG/ML) 0.5% IN NEBU: 2.5000 mg | INHALATION_SOLUTION | RESPIRATORY_TRACT | Status: DC | PRN

## 2011-08-25 MED ORDER — HYDROMORPHONE HCL PF 1 MG/ML IJ SOLN: 1.0000 mg | INTRAMUSCULAR | Status: DC | PRN

## 2011-08-25 MED ORDER — ONDANSETRON 4 MG PO TBDP
8.0000 mg | ORAL_TABLET | Freq: Once | ORAL | Status: AC
  Administered 2011-08-25: 8 mg via ORAL
  Filled 2011-08-25: qty 2

## 2011-08-25 MED ORDER — ENOXAPARIN SODIUM 40 MG/0.4ML ~~LOC~~ SOLN
40.0000 mg | SUBCUTANEOUS | Status: DC
  Administered 2011-08-25 – 2011-08-26 (×2): 40 mg via SUBCUTANEOUS
  Filled 2011-08-25 (×3): qty 0.4

## 2011-08-25 MED ORDER — GUAIFENESIN ER 600 MG PO TB12
600.0000 mg | ORAL_TABLET | Freq: Two times a day (BID) | ORAL | Status: DC
  Administered 2011-08-25 – 2011-08-27 (×4): 600 mg via ORAL
  Filled 2011-08-25 (×5): qty 1

## 2011-08-25 MED ORDER — INSULIN ASPART 100 UNIT/ML ~~LOC~~ SOLN
0.0000 [IU] | Freq: Three times a day (TID) | SUBCUTANEOUS | Status: DC
  Administered 2011-08-26: 3 [IU] via SUBCUTANEOUS
  Administered 2011-08-27: 2 [IU] via SUBCUTANEOUS

## 2011-08-25 MED ORDER — ONDANSETRON HCL 4 MG/2ML IJ SOLN: 4.0000 mg | Freq: Four times a day (QID) | INTRAMUSCULAR | Status: DC | PRN

## 2011-08-25 MED ORDER — IPRATROPIUM BROMIDE 0.02 % IN SOLN
0.5000 mg | Freq: Four times a day (QID) | RESPIRATORY_TRACT | Status: DC
  Administered 2011-08-26: 0.5 mg via RESPIRATORY_TRACT
  Filled 2011-08-25: qty 2.5

## 2011-08-25 MED ORDER — ALBUTEROL SULFATE (5 MG/ML) 0.5% IN NEBU
2.5000 mg | INHALATION_SOLUTION | Freq: Four times a day (QID) | RESPIRATORY_TRACT | Status: DC
  Administered 2011-08-26: 2.5 mg via RESPIRATORY_TRACT
  Filled 2011-08-25: qty 0.5

## 2011-08-25 NOTE — ED Notes (Signed)
C/o severe abd pain in her abd since 1100am today with nv and diarrhea.  She has had 13 surgeries on a hernia that is in her abd

## 2011-08-25 NOTE — Consult Note (Signed)
Reason for Consult: SBO, Ventral hernia Referring Physician: [name] is an 61 y.o. female.  HPI: I was asked by Dr. Jeraldine Kennedy to evaluate this patient for small bowel obstruction and ventral hernia. The patient states that she has a history of at least 12 or 13 repairs of her ventral incisional hernia all done in [place].her last surgery was in 2002. Review of her chart indicates she had a small bowel resection and she also had an open wound. She has had a known recurrent ventral hernia for some time. She was admitted with a bowel obstruction that resolved without surgery in September of last year. She has a persistent bulge in the right side of her mid abdomen. Earlier today she began to feel nauseated. Soon thereafter she developed pain across her midabdomen and vomiting and presented to the emergency department. Plain abdominal x-rays indicate small bowel obstruction. She was seen by Dr. Jeraldine Kennedy who describes reducing her her a with immediate relief of her pain and nausea. She states she now is not having pain or nausea. She is being admitted for observation.  Past Medical History  Diagnosis Date  . Diabetes mellitus   . Hypertension   . COPD (chronic obstructive pulmonary disease)    Surgery: Remote surgery includes open cholecystectomy, appendectomy, and laparoscopic tubal ligation. As above she has had at least 10 repairs of her ventral abdominal midline hernia over many years.  History reviewed. No pertinent family history.  Social History:  reports that she has been smoking.  She does not have any smokeless tobacco history on file. She reports that she does not drink alcohol. Her drug history not on file.  Allergies:  Allergies  Allergen Reactions  . Penicillins Hives    Medications:  Prior to Admission:  Prescriptions prior to admission  Medication Sig Dispense Refill  . albuterol (PROVENTIL HFA;VENTOLIN HFA) 108 (90 BASE) MCG/ACT inhaler Inhale 2 puffs into  the lungs every 4 (four) hours as needed. For shortness of breath      . budesonide-formoterol (SYMBICORT) 160-4.5 MCG/ACT inhaler Inhale 1 puff into the lungs 2 (two) times daily.      . citalopram (CELEXA) 20 MG tablet Take 40 mg by mouth daily.       . enalapril (VASOTEC) 20 MG tablet Take 20 mg by mouth daily.      Marland Kitchen glyBURIDE (DIABETA) 5 MG tablet Take 5 mg by mouth 2 (two) times daily.      . insulin glargine (LANTUS) 100 UNIT/ML injection Inject 10 Units into the skin at bedtime.      Marland Kitchen loratadine (CLARITIN) 10 MG tablet Take 10 mg by mouth daily.      . metFORMIN (GLUCOPHAGE) 500 MG tablet Take 1,000 mg by mouth 2 (two) times daily.        Results for orders placed during the hospital encounter of 08/25/11 (from the past 48 hour(s))  CBC     Status: Abnormal   Collection Time   08/25/11  4:55 PM      Component Value Range Comment   WBC 19.4 (*) 4.0 - 10.5 (K/uL)    RBC 5.82 (*) 3.87 - 5.11 (MIL/uL)    Hemoglobin 17.1 (*) 12.0 - 15.0 (g/dL)    HCT 16.1 (*) 09.6 - 46.0 (%)    MCV 84.2  78.0 - 100.0 (fL)    MCH 29.4  26.0 - 34.0 (pg)    MCHC 34.9  30.0 - 36.0 (g/dL)    RDW 14.0  11.5 - 15.5 (%)    Platelets 245  150 - 400 (K/uL)   DIFFERENTIAL     Status: Abnormal   Collection Time   08/25/11  4:55 PM      Component Value Range Comment   Neutrophils Relative 93 (*) 43 - 77 (%)    Neutro Abs 18.1 (*) 1.7 - 7.7 (K/uL)    Lymphocytes Relative 5 (*) 12 - 46 (%)    Lymphs Abs 0.9  0.7 - 4.0 (K/uL)    Monocytes Relative 2 (*) 3 - 12 (%)    Monocytes Absolute 0.5  0.1 - 1.0 (K/uL)    Eosinophils Relative 0  0 - 5 (%)    Eosinophils Absolute 0.0  0.0 - 0.7 (K/uL)    Basophils Relative 0  0 - 1 (%)    Basophils Absolute 0.0  0.0 - 0.1 (K/uL)   COMPREHENSIVE METABOLIC PANEL     Status: Abnormal   Collection Time   08/25/11  4:55 PM      Component Value Range Comment   Sodium 136  135 - 145 (mEq/L)    Potassium 4.3  3.5 - 5.1 (mEq/L)    Chloride 96  96 - 112 (mEq/L)    CO2 22  19 -  32 (mEq/L)    Glucose, Bld 266 (*) 70 - 99 (mg/dL)    BUN 19  6 - 23 (mg/dL)    Creatinine, Ser 1.61  0.50 - 1.10 (mg/dL)    Calcium 09.6  8.4 - 10.5 (mg/dL)    Total Protein 7.1  6.0 - 8.3 (g/dL)    Albumin 4.2  3.5 - 5.2 (g/dL)    AST 11  0 - 37 (U/L)    ALT 10  0 - 35 (U/L)    Alkaline Phosphatase 72  39 - 117 (U/L)    Total Bilirubin 0.6  0.3 - 1.2 (mg/dL)    GFR calc non Af Amer 80 (*) >90 (mL/min)    GFR calc Af Amer >90  >90 (mL/min)   LIPASE, BLOOD     Status: Normal   Collection Time   08/25/11  4:55 PM      Component Value Range Comment   Lipase 51  11 - 59 (U/L)   LACTIC ACID, PLASMA     Status: Normal   Collection Time   08/25/11  6:20 PM      Component Value Range Comment   Lactic Acid, Venous 1.3  0.5 - 2.2 (mmol/L)     Dg Abd Acute W/chest  08/25/2011  *RADIOLOGY REPORT*  Clinical Data: Abdominal pain.  History of small bowel obstruction.  ACUTE ABDOMEN SERIES (ABDOMEN 2 VIEW & CHEST 1 VIEW)  Comparison: 01/05/2011  Findings: Moderate small bowel obstruction with differential air- fluid levels compatible with small bowel obstruction.  Colon is decompressed.  No free air in the peritoneum.  Heart size is normal.  No heart failure or pneumonia.  Lungs are clear.  COPD.  IMPRESSION: Small bowel obstruction.  Original Report Authenticated By: Camelia Phenes, M.D.    Review of Systems  Constitutional: Negative for fever and chills.  Respiratory: Positive for cough and shortness of breath.   Cardiovascular: Negative for chest pain and palpitations.  Gastrointestinal: Positive for vomiting and abdominal pain.   Blood pressure 109/56, pulse 87, temperature 98.7 F (37.1 C), temperature source Oral, resp. rate 20, SpO2 94.00%. Physical Exam General: Obese Caucasian female in no distress Skin: No rash or infection HEENT: No palpable  masses. Sclera nonicteric. Lymph nodes: No cervical, supraclavicular or inguinal nodes palpable Lungs: Clear without wheezing or increased work  of breathing Cardiovascular: Regular rate and rhythm. No peripheral edema. Abdomen: Multiple healed incisions. Obese. Just to the right of the midline in the midabdomen is a discrete reducible hernia defect. This did feel to contain bowel on my exam but reduced with mild pressure and appears to be coming through a fairly small tight defect. Her abdomen in general is soft and nontender. Bowel sounds are normal. Extremities: No joint swelling or edema Neurologic: She is alert and fully urine. Affect normal.  Assessment/Plan: Multiply recurrent ventral incisional hernia. She presented with small bowel obstruction which appears to have been secondary to an incarcerated hernia which is now reduced. There is no need for emergency surgery. She is being admitted for observation. She has multiple comorbidities and risk factors for poor outcome for elective hernia repair however this may be required due to risk of further obstruction through her tight defect. Will follow.  [name] T 08/25/2011, 9:01 PM

## 2011-08-25 NOTE — ED Notes (Signed)
Pt placed on 2L of O2 due to sats in the upper 80's

## 2011-08-25 NOTE — ED Provider Notes (Signed)
History     CSN: [id number]  Arrival date & time 08/25/11  1524   First MD Initiated Contact with Patient 08/25/11 1718      Chief Complaint  Patient presents with  . Abdominal Pain    HPI The patient presents following acute onset of nausea with vomiting.  She notes that this began approximately 5 hours prior to presentation.  In the hours prior to the onset of symptoms she did have mild diffuse discomfort across her back, but no other focal complaints.  When her nausea and vomiting began, they were intractable, without any clear alleviating or exacerbating factors.  Symptoms did improve in the free time prior to arrival.  She denies any ongoing fevers, chills, confusion, disorientation. Notably, the patient has a history of multiple lysis of adhesion procedures, has a known ventral hernia, and is not currently scheduled for operative repair. Past Medical History  Diagnosis Date  . Diabetes mellitus   . Hypertension   . COPD (chronic obstructive pulmonary disease)     History reviewed. No pertinent past surgical history.  No family history on file.  History  Substance Use Topics  . Smoking status: Current Everyday Smoker  . Smokeless tobacco: Not on file  . Alcohol Use: No    OB History    Grav Para Term Preterm Abortions TAB SAB Ect Mult Living                  Review of Systems  Constitutional:       HPI  HENT:       HPI otherwise negative  Eyes: Negative.   Respiratory:       HPI, otherwise negative  Cardiovascular:       HPI, otherwise nmegative  Gastrointestinal: Positive for vomiting. Negative for diarrhea.  Genitourinary:       HPI, otherwise negative  Musculoskeletal:       HPI, otherwise negative  Skin: Negative.   Neurological: Negative for syncope.    Allergies  Penicillins  Home Medications   Current Outpatient Rx  Name Route Sig Dispense Refill  . ALBUTEROL SULFATE HFA 108 (90 BASE) MCG/ACT IN AERS Inhalation Inhale 2 puffs into the lungs  every 4 (four) hours as needed. For shortness of breath    . BUDESONIDE-FORMOTEROL FUMARATE 160-4.5 MCG/ACT IN AERO Inhalation Inhale 1 puff into the lungs 2 (two) times daily.    Marland Kitchen CITALOPRAM HYDROBROMIDE 20 MG PO TABS Oral Take 40 mg by mouth daily.     . ENALAPRIL MALEATE 20 MG PO TABS Oral Take 20 mg by mouth daily.    . GLYBURIDE 5 MG PO TABS Oral Take 5 mg by mouth 2 (two) times daily.    . INSULIN GLARGINE 100 UNIT/ML  SOLN Subcutaneous Inject 10 Units into the skin at bedtime.    Marland Kitchen LORATADINE 10 MG PO TABS Oral Take 10 mg by mouth daily.    Marland Kitchen METFORMIN HCL 500 MG PO TABS Oral Take 1,000 mg by mouth 2 (two) times daily.      BP 109/56  Pulse 87  Temp(Src) 98.7 F (37.1 C) (Oral)  Resp 20  SpO2 94%  Physical Exam  Nursing note and vitals reviewed. Constitutional: She is oriented to person, place, and time. She appears well-developed and well-nourished. No distress.       Morbidly obese female in some discomfort  HENT:  Head: Normocephalic and atraumatic.  Eyes: Conjunctivae and EOM are normal.  Cardiovascular: Regular rhythm.  Tachycardia present.  Pulmonary/Chest: Effort normal and breath sounds normal. No stridor. No respiratory distress. She has no decreased breath sounds.  Abdominal: Soft. She exhibits no distension. There is generalized tenderness. There is no rigidity, no rebound and no guarding. A hernia is present.    Musculoskeletal: She exhibits no edema.  Neurological: She is alert and oriented to person, place, and time. No cranial nerve deficit.  Skin: Skin is warm and dry.  Psychiatric: She has a normal mood and affect.    ED Course  Procedures (including critical care time)  Labs Reviewed  CBC - Abnormal; Notable for the following:    WBC 19.4 (*)    RBC 5.82 (*)    Hemoglobin 17.1 (*)    HCT 49.0 (*)    All other components within normal limits  DIFFERENTIAL - Abnormal; Notable for the following:    Neutrophils Relative 93 (*)    Neutro Abs 18.1  (*)    Lymphocytes Relative 5 (*)    Monocytes Relative 2 (*)    All other components within normal limits  LACTIC ACID, PLASMA  COMPREHENSIVE METABOLIC PANEL  LIPASE, BLOOD   Dg Abd Acute W/chest  08/25/2011  *RADIOLOGY REPORT*  Clinical Data: Abdominal pain.  History of small bowel obstruction.  ACUTE ABDOMEN SERIES (ABDOMEN 2 VIEW & CHEST 1 VIEW)  Comparison: 01/05/2011  Findings: Moderate small bowel obstruction with differential air- fluid levels compatible with small bowel obstruction.  Colon is decompressed.  No free air in the peritoneum.  Heart size is normal.  No heart failure or pneumonia.  Lungs are clear.  COPD.  IMPRESSION: Small bowel obstruction.  Original Report Authenticated By: Camelia Phenes, M.D.     No diagnosis found.  Cardiac: 85 sr, normal  Pulse ox 100% ra- normal  MDM  This female with known ventral hernia, history of multiple prior small bowel obstructions now presents with acute onset of nausea and vomiting.  The description of the onset of symptoms, the patient's history of sbo, and her known hernia are concerning for a recurrence of this phenomena.  On my initial exam I reduced the hernia with manual pressure.  The patient's labs are notable for leukocytosis.  The patient's x-ray demonstrates new small bowel obstruction.  Following reduction of the hernia, the patient was significantly more comfortable.  The patient deferred treatment of a nasogastric tube, but needs admission for further evaluation and management of her bowel structures.  I have spoken with the surgical team for consultation the patient will be admitted to the hospitalist service for further evaluation and management.      Gerhard Munch, MD 08/26/11 [phone]

## 2011-08-25 NOTE — ED Notes (Signed)
Attempt to call report x1. Secretary given this RN's number to return call

## 2011-08-26 ENCOUNTER — Inpatient Hospital Stay (HOSPITAL_COMMUNITY): Payer: Self-pay

## 2011-08-26 DIAGNOSIS — I10 Essential (primary) hypertension: Secondary | ICD-10-CM

## 2011-08-26 DIAGNOSIS — R0902 Hypoxemia: Secondary | ICD-10-CM

## 2011-08-26 DIAGNOSIS — E119 Type 2 diabetes mellitus without complications: Secondary | ICD-10-CM

## 2011-08-26 DIAGNOSIS — K56609 Unspecified intestinal obstruction, unspecified as to partial versus complete obstruction: Secondary | ICD-10-CM

## 2011-08-26 LAB — COMPREHENSIVE METABOLIC PANEL
AST: 8 U/L (ref 0–37)
Albumin: 3 g/dL — ABNORMAL LOW (ref 3.5–5.2)
Alkaline Phosphatase: 49 U/L (ref 39–117)
BUN: 16 mg/dL (ref 6–23)
Creatinine, Ser: 0.63 mg/dL (ref 0.50–1.10)
Potassium: 4.1 mEq/L (ref 3.5–5.1)
Total Protein: 5.6 g/dL — ABNORMAL LOW (ref 6.0–8.3)

## 2011-08-26 LAB — GLUCOSE, CAPILLARY
Glucose-Capillary: 212 mg/dL — ABNORMAL HIGH (ref 70–99)
Glucose-Capillary: 159 mg/dL — ABNORMAL HIGH (ref 70–99)
Glucose-Capillary: 111 mg/dL — ABNORMAL HIGH (ref 70–99)

## 2011-08-26 LAB — CBC
HCT: 41.5 % (ref 36.0–46.0)
MCHC: 33.7 g/dL (ref 30.0–36.0)
Platelets: 211 10*3/uL (ref 150–400)
RDW: 14.1 % (ref 11.5–15.5)

## 2011-08-26 LAB — HEMOGLOBIN A1C
Hgb A1c MFr Bld: 8.1 % — ABNORMAL HIGH (ref <5.7)
Mean Plasma Glucose: 186 mg/dL — ABNORMAL HIGH (ref <117)

## 2011-08-26 MED ORDER — BIOTENE DRY MOUTH MT LIQD
15.0000 mL | Freq: Two times a day (BID) | OROMUCOSAL | Status: DC
  Administered 2011-08-26 – 2011-08-27 (×2): 15 mL via OROMUCOSAL

## 2011-08-26 MED ORDER — BIOTENE DRY MOUTH MT LIQD: 15.0000 mL | Freq: Two times a day (BID) | OROMUCOSAL | Status: DC

## 2011-08-26 MED ORDER — CHLORHEXIDINE GLUCONATE 0.12 % MT SOLN
15.0000 mL | Freq: Two times a day (BID) | OROMUCOSAL | Status: DC
  Administered 2011-08-26 – 2011-08-27 (×3): 15 mL via OROMUCOSAL
  Filled 2011-08-26 (×2): qty 15

## 2011-08-26 NOTE — Progress Notes (Signed)
Subjective: No vomiting, no abdominal pain, passed flatus.  Objective: Vital signs in last 24 hours: Temp:  [97.5 F (36.4 C)-98.7 F (37.1 C)] 98.5 F (36.9 C) (05/08 0623) Pulse Rate:  [81-104] 81  (05/08 0623) Resp:  [20-23] 20  (05/08 0623) BP: (104-145)/(44-73) 104/44 mmHg (05/08 0623) SpO2:  [94 %-96 %] 94 % (05/08 0623) Weight:  [93.3 kg (205 lb 11 oz)] 93.3 kg (205 lb 11 oz) (05/08 0347) Weight change:  Last BM Date: 08/25/11  Intake/Output from previous day: 05/07 0701 - 05/08 0700 In: 652 [I.V.:652] Out: -      Physical Exam: General: Comfortable, alert, communicative, fully oriented, not short of breath at rest.  HEENT:  No clinical pallor, no jaundice, no conjunctival injection or discharge. Hydration status appears fair. NECK:  Supple, JVP not seen, no carotid bruits, no palpable lymphadenopathy, no palpable goiter. CHEST:  Clinically clear to auscultation, no wheezes, no crackles. HEART:  Sounds 1 and 2 heard, normal, regular, no murmurs. ABDOMEN:  Moderately obese, soft, has old surgical scars, non-tender, no palpable organomegaly, no palpable masses. Ventral hernia is once again, discernible in hernia sac. Bowel sounds are sluggish. GENITALIA:  Not examined. LOWER EXTREMITIES:  No pitting edema, palpable peripheral pulses. MUSCULOSKELETAL SYSTEM:  Generalized osteoarthritic changes, otherwise, normal. CENTRAL NERVOUS SYSTEM:  No focal neurologic deficit on gross examination.  Lab Results:  Basename 08/26/11 0622 08/25/11 2128  WBC 12.5* 14.3*  HGB 14.0 15.1*  HCT 41.5 44.1  PLT 211 216    Basename 08/26/11 0622 08/25/11 2128 08/25/11 1655  NA 138 -- 136  K 4.1 -- 4.3  CL 104 -- 96  CO2 25 -- 22  GLUCOSE 135* -- 266*  BUN 16 -- 19  CREATININE 0.63 0.76 --  CALCIUM 8.7 -- 10.4   No results found for this or any previous visit (from the past 240 hour(s)).   Studies/Results: Abd 1 View (kub)  08/26/2011  *RADIOLOGY REPORT*  Clinical Data:  Abdominal pain, nausea.  ABDOMEN - 1 VIEW  Comparison: 08/25/2011  Findings: Continued dilated small bowel loops, not significantly changed.  Central small bowel loops are slightly decompressed, increasing distention of the left abdominal small bowel loops.  Gas within nondistended colon.  No free air.  IMPRESSION: No significant change in small bowel obstruction pattern.  Original Report Authenticated By: Cyndie Chime, M.D.   Dg Abd Acute W/chest  08/25/2011  *RADIOLOGY REPORT*  Clinical Data: Abdominal pain.  History of small bowel obstruction.  ACUTE ABDOMEN SERIES (ABDOMEN 2 VIEW & CHEST 1 VIEW)  Comparison: 01/05/2011  Findings: Moderate small bowel obstruction with differential air- fluid levels compatible with small bowel obstruction.  Colon is decompressed.  No free air in the peritoneum.  Heart size is normal.  No heart failure or pneumonia.  Lungs are clear.  COPD.  IMPRESSION: Small bowel obstruction.  Original Report Authenticated By: Camelia Phenes, M.D.    Medications: Scheduled Meds:   . albuterol  2.5 mg Nebulization Q6H  . antiseptic oral rinse  15 mL Mouth Rinse BID  . chlorhexidine  15 mL Mouth Rinse BID  . enoxaparin  40 mg Subcutaneous Q24H  . guaiFENesin  600 mg Oral BID  . insulin aspart  0-15 Units Subcutaneous TID WC  . ipratropium  0.5 mg Nebulization Q6H  . ondansetron  8 mg Oral Once  . DISCONTD: antiseptic oral rinse  15 mL Mouth Rinse q12n4p   Continuous Infusions:   . sodium chloride 150 mL/hr at  08/26/11 0639   PRN Meds:.albuterol, HYDROmorphone (DILAUDID) injection, ondansetron (ZOFRAN) IV, ondansetron  Assessment/Plan:   1. Small bowel obstruction: This is mechanical, secondary to incarceration of a ventral hernia, and was successfully reduced in ED. Overnight however, appears to be protruding into hernial sac. Bowel sounds are heard, albeit sluggish, and patient has managed to pass flatus. No vomiting was noted overnight. Obstruction now seems partial.  Abdominal X-ray of 08/26/11, shows continued dilated small bowel loops, not significantly changed. Central small bowel loops are slightly decompressed,  increasing distention of the left abdominal small bowel loops. Gas within nondistended colon. No free air. We shall continue bowel rest, ivi fluids, encourage ambulation, and await surgical recommendations. 2. Diabetes: Reasonably controlled on sliding scale insulin. 3  Hypoxia: Patient had a mild hypoxia at the time of presentation, deemed secondary to COPD, without evidence of acute exacerbation. She was managed with broncho-dilator nebulizers and oxygen supplementation, and as of AM of 08/26/11, hypoxia appears to have resolved.  4. Hypertension: Patient is n.p.o. for now, so her blood pressure medication is on hold. BP has remained controlled. 5. Leukocytosis: Most likely from a small bowel obstruction/Margination. Wcc is trending down, and patient has remained apyrexial.  Comment: Disposition is per surgical team.   LOS: 1 day   [name],CHRISTOPHER 08/26/2011, 9:40 AM

## 2011-08-26 NOTE — H&P (Signed)
[name] is an 61 y.o. female.   Chief Complaint: Abdominal pain nausea HPI: This is a 61 year old female with known history of ventral hernia secondary to multiple abdominal surgeries last of which was performed in 2002. Patient is here secondary to a sudden onset of nausea abdominal distention and what appeared to be incarceration of her usually reducible ventral hernia. She had one episode of vomiting prior to coming to the emergency room. She had a pain abdominal x-ray that confirms small bowel obstruction more than likely mechanical. The hernia was reduced in the ER. Patient feels much better now that she is being admitted for observation to make sure that hernia does not return incarcerated.  Past Medical History  Diagnosis Date  . Hypertension   . COPD (chronic obstructive pulmonary disease)   . Type II diabetes mellitus   . Arthritis     "hands, knees, feet"  . Pneumonia 08/2010; 11/2010  . SBO (small bowel obstruction) 08/25/11    recurrent    Past Surgical History  Procedure Date  . Laparoscopic incisional / umbilical / ventral hernia repair ? til 2002    "~ 13 times"  . Cholecystectomy 1979  . Abdominal hysterectomy 1972  . Tubal ligation 1978  . Hernia repair   . Knee cartilage surgery 07/2003    right/E-chart    History reviewed. No pertinent family history. Social History:  reports that she has been smoking Cigarettes.  She has a 42 pack-year smoking history. She has never used smokeless tobacco. She reports that she does not drink alcohol or use illicit drugs.  Allergies:  Allergies  Allergen Reactions  . Penicillins Hives    Medications Prior to Admission  Medication Sig Dispense Refill  . albuterol (PROVENTIL HFA;VENTOLIN HFA) 108 (90 BASE) MCG/ACT inhaler Inhale 2 puffs into the lungs every 4 (four) hours as needed. For shortness of breath      . budesonide-formoterol (SYMBICORT) 160-4.5 MCG/ACT inhaler Inhale 1 puff into the lungs 2 (two) times daily.        . citalopram (CELEXA) 20 MG tablet Take 40 mg by mouth daily.       . enalapril (VASOTEC) 20 MG tablet Take 20 mg by mouth daily.      Marland Kitchen glyBURIDE (DIABETA) 5 MG tablet Take 5 mg by mouth 2 (two) times daily.      . insulin glargine (LANTUS) 100 UNIT/ML injection Inject 10 Units into the skin at bedtime.      Marland Kitchen loratadine (CLARITIN) 10 MG tablet Take 10 mg by mouth daily.      . metFORMIN (GLUCOPHAGE) 500 MG tablet Take 1,000 mg by mouth 2 (two) times daily.        Results for orders placed during the hospital encounter of 08/25/11 (from the past 48 hour(s))  CBC     Status: Abnormal   Collection Time   08/25/11  4:55 PM      Component Value Range Comment   WBC 19.4 (*) 4.0 - 10.5 (K/uL)    RBC 5.82 (*) 3.87 - 5.11 (MIL/uL)    Hemoglobin 17.1 (*) 12.0 - 15.0 (g/dL)    HCT 16.1 (*) 09.6 - 46.0 (%)    MCV 84.2  78.0 - 100.0 (fL)    MCH 29.4  26.0 - 34.0 (pg)    MCHC 34.9  30.0 - 36.0 (g/dL)    RDW 04.5  40.9 - 81.1 (%)    Platelets 245  150 - 400 (K/uL)   DIFFERENTIAL  Status: Abnormal   Collection Time   08/25/11  4:55 PM      Component Value Range Comment   Neutrophils Relative 93 (*) 43 - 77 (%)    Neutro Abs 18.1 (*) 1.7 - 7.7 (K/uL)    Lymphocytes Relative 5 (*) 12 - 46 (%)    Lymphs Abs 0.9  0.7 - 4.0 (K/uL)    Monocytes Relative 2 (*) 3 - 12 (%)    Monocytes Absolute 0.5  0.1 - 1.0 (K/uL)    Eosinophils Relative 0  0 - 5 (%)    Eosinophils Absolute 0.0  0.0 - 0.7 (K/uL)    Basophils Relative 0  0 - 1 (%)    Basophils Absolute 0.0  0.0 - 0.1 (K/uL)   COMPREHENSIVE METABOLIC PANEL     Status: Abnormal   Collection Time   08/25/11  4:55 PM      Component Value Range Comment   Sodium 136  135 - 145 (mEq/L)    Potassium 4.3  3.5 - 5.1 (mEq/L)    Chloride 96  96 - 112 (mEq/L)    CO2 22  19 - 32 (mEq/L)    Glucose, Bld 266 (*) 70 - 99 (mg/dL)    BUN 19  6 - 23 (mg/dL)    Creatinine, Ser 1.61  0.50 - 1.10 (mg/dL)    Calcium 09.6  8.4 - 10.5 (mg/dL)    Total Protein 7.1   6.0 - 8.3 (g/dL)    Albumin 4.2  3.5 - 5.2 (g/dL)    AST 11  0 - 37 (U/L)    ALT 10  0 - 35 (U/L)    Alkaline Phosphatase 72  39 - 117 (U/L)    Total Bilirubin 0.6  0.3 - 1.2 (mg/dL)    GFR calc non Af Amer 80 (*) >90 (mL/min)    GFR calc Af Amer >90  >90 (mL/min)   LIPASE, BLOOD     Status: Normal   Collection Time   08/25/11  4:55 PM      Component Value Range Comment   Lipase 51  11 - 59 (U/L)   LACTIC ACID, PLASMA     Status: Normal   Collection Time   08/25/11  6:20 PM      Component Value Range Comment   Lactic Acid, Venous 1.3  0.5 - 2.2 (mmol/L)   CBC     Status: Abnormal   Collection Time   08/25/11  9:28 PM      Component Value Range Comment   WBC 14.3 (*) 4.0 - 10.5 (K/uL)    RBC 5.22 (*) 3.87 - 5.11 (MIL/uL)    Hemoglobin 15.1 (*) 12.0 - 15.0 (g/dL)    HCT 04.5  40.9 - 81.1 (%)    MCV 84.5  78.0 - 100.0 (fL)    MCH 28.9  26.0 - 34.0 (pg)    MCHC 34.2  30.0 - 36.0 (g/dL)    RDW 91.4  78.2 - 95.6 (%)    Platelets 216  150 - 400 (K/uL)   CREATININE, SERUM     Status: Normal   Collection Time   08/25/11  9:28 PM      Component Value Range Comment   Creatinine, Ser 0.76  0.50 - 1.10 (mg/dL)    GFR calc non Af Amer >90  >90 (mL/min)    GFR calc Af Amer >90  >90 (mL/min)    Dg Abd Acute W/chest  08/25/2011  *RADIOLOGY REPORT*  Clinical  Data: Abdominal pain.  History of small bowel obstruction.  ACUTE ABDOMEN SERIES (ABDOMEN 2 VIEW & CHEST 1 VIEW)  Comparison: 01/05/2011  Findings: Moderate small bowel obstruction with differential air- fluid levels compatible with small bowel obstruction.  Colon is decompressed.  No free air in the peritoneum.  Heart size is normal.  No heart failure or pneumonia.  Lungs are clear.  COPD.  IMPRESSION: Small bowel obstruction.  Original Report Authenticated By: Camelia Phenes, M.D.    Review of Systems  Constitutional: Negative.   HENT: Negative.   Eyes: Negative.   Respiratory: Negative.   Cardiovascular: Negative.   Gastrointestinal:  Positive for nausea, vomiting and abdominal pain. Negative for heartburn, diarrhea, constipation, blood in stool and melena.  Genitourinary: Negative.   Musculoskeletal: Negative.   Skin: Negative.   Neurological: Negative.   Endo/Heme/Allergies: Negative.   Psychiatric/Behavioral: Negative.     Blood pressure 126/53, pulse 91, temperature 98.3 F (36.8 C), temperature source Oral, resp. rate 20, height 5\' 4"  (1.626 m), weight 93.3 kg (205 lb 11 oz), SpO2 94.00%. Physical Exam  Constitutional: She is oriented to person, place, and time. She appears well-developed and well-nourished.  HENT:  Head: Normocephalic and atraumatic.  Right Ear: External ear normal.  Left Ear: External ear normal.  Nose: Nose normal.  Mouth/Throat: Oropharynx is clear and moist.  Eyes: Conjunctivae and EOM are normal. Pupils are equal, round, and reactive to light.  Neck: Normal range of motion. Neck supple.  Cardiovascular: Normal rate, regular rhythm, normal heart sounds and intact distal pulses.   Respiratory: Effort normal and breath sounds normal.  GI: She exhibits distension. She exhibits no mass. There is no tenderness. There is no rebound and no guarding.  Musculoskeletal: Normal range of motion.  Neurological: She is alert and oriented to person, place, and time. She has normal reflexes.  Skin: Skin is warm and dry.  Psychiatric: She has a normal mood and affect. Her behavior is normal. Judgment and thought content normal.     Assessment/Plan Assessment this is a 61 year old female being admitted for observation for recurrent small bowel obstruction. She also has chronic medical problems including hypertension COPD and diabetes. Plan #1 small bowel obstruction: At this point seems to be resolving. It appears to be mechanical in nature due to incarceration of a ventral hernia. We will observe her overnight. I will keep her n.p.o. Advance her diet in the morning as tolerated. Was able to eat and drink  without a problem she can go home on continued surgical consult. Plan #2 diabetes: We'll continue his home insulin and sliding scale insulin #3 hypoxia: Secondary to chronic COPD. We'll keep her on nebulizers. She is currently not wheezing so I will avoid steroids. #4 hypertension: Patient will be n.p.o. for now but will resume her blood pressure medication as soon as she is able to take by mouth. #5 leukocytosis: Most likely from a small bowel obstruction.  [name],LAWAL 08/26/2011, 4:14 AM

## 2011-08-26 NOTE — Progress Notes (Signed)
Patient ID: [name], female   DOB: 12-14-1950, 61 y.o.   MRN: [id number]    Subjective: Pt feels great.  No pain.  No nausea/vomiting.  Passing flatus/had BM  Objective: Vital signs in last 24 hours: Temp:  [97.5 F (36.4 C)-98.7 F (37.1 C)] 98.5 F (36.9 C) (05/08 0623) Pulse Rate:  [81-104] 81  (05/08 0623) Resp:  [20-23] 20  (05/08 0623) BP: (104-145)/(44-73) 104/44 mmHg (05/08 0623) SpO2:  [94 %-96 %] 94 % (05/08 0623) Weight:  [205 lb 11 oz (93.3 kg)] 205 lb 11 oz (93.3 kg) (05/08 0347) Last BM Date: 08/25/11  Intake/Output from previous day: 05/07 0701 - 05/08 0700 In: 652 [I.V.:652] Out: -  Intake/Output this shift:    PE: Abd: soft, NT, ND, hypoactive BS, hernia just right lateral to her umbilicus is easily reducible and nontender  Lab Results:   Basename 08/26/11 0622 08/25/11 2128  WBC 12.5* 14.3*  HGB 14.0 15.1*  HCT 41.5 44.1  PLT 211 216   BMET  Basename 08/26/11 0622 08/25/11 2128 08/25/11 1655  NA 138 -- 136  K 4.1 -- 4.3  CL 104 -- 96  CO2 25 -- 22  GLUCOSE 135* -- 266*  BUN 16 -- 19  CREATININE 0.63 0.76 --  CALCIUM 8.7 -- 10.4   PT/INR No results found for this basename: LABPROT:2,INR:2 in the last 72 hours CMP     Component Value Date/Time   NA 138 08/26/2011 0622   K 4.1 08/26/2011 0622   CL 104 08/26/2011 0622   CO2 25 08/26/2011 0622   GLUCOSE 135* 08/26/2011 0622   BUN 16 08/26/2011 0622   CREATININE 0.63 08/26/2011 0622   CALCIUM 8.7 08/26/2011 0622   PROT 5.6* 08/26/2011 0622   ALBUMIN 3.0* 08/26/2011 0622   AST 8 08/26/2011 0622   ALT 8 08/26/2011 0622   ALKPHOS 49 08/26/2011 0622   BILITOT 0.4 08/26/2011 0622   GFRNONAA >90 08/26/2011 0622   GFRAA >90 08/26/2011 0622   Lipase     Component Value Date/Time   LIPASE 51 08/25/2011 1655       Studies/Results: Abd 1 View (kub)  08/26/2011  *RADIOLOGY REPORT*  Clinical Data: Abdominal pain, nausea.  ABDOMEN - 1 VIEW  Comparison: 08/25/2011  Findings: Continued dilated small bowel loops, not  significantly changed.  Central small bowel loops are slightly decompressed, increasing distention of the left abdominal small bowel loops.  Gas within nondistended colon.  No free air.  IMPRESSION: No significant change in small bowel obstruction pattern.  Original Report Authenticated By: Cyndie Chime, M.D.   Dg Abd Acute W/chest  08/25/2011  *RADIOLOGY REPORT*  Clinical Data: Abdominal pain.  History of small bowel obstruction.  ACUTE ABDOMEN SERIES (ABDOMEN 2 VIEW & CHEST 1 VIEW)  Comparison: 01/05/2011  Findings: Moderate small bowel obstruction with differential air- fluid levels compatible with small bowel obstruction.  Colon is decompressed.  No free air in the peritoneum.  Heart size is normal.  No heart failure or pneumonia.  Lungs are clear.  COPD.  IMPRESSION: Small bowel obstruction.  Original Report Authenticated By: Camelia Phenes, M.D.    Anti-infectives: Anti-infectives    None       Assessment/Plan  1. PSBO 2. Recurrent ventral hernia  Plan: 1. This hernia is easily reducible.  I do not feel that this is likely the cause of her PSBO.  She is clinically doing great with no signs of symptoms of an SBO, except via  x-ray which still shows a dilated loop of small bowel.  She could have a PSBO from adhesive disease given her numerous prior surgeries.  I will d/w MD if he wants to get further imaging to better define obstruction such as CT or UGI (she had the same thing just 6-7 months ago that was likely not related to her hernia then either) or whether he wants to treat her clinically and allow her some liquids.   LOS: 1 day    Fleurette Woolbright E 08/26/2011

## 2011-08-26 NOTE — Progress Notes (Signed)
Will treat clinically for now. Agree point of obstruction likely adhesive diesase and not related to current reducible hernia. If fails po trial, ct enterography.  Mary Sella. Andrey Campanile, MD, FACS General, Bariatric, & Minimally Invasive Surgery [facility], [place]

## 2011-08-27 DIAGNOSIS — K56609 Unspecified intestinal obstruction, unspecified as to partial versus complete obstruction: Secondary | ICD-10-CM

## 2011-08-27 DIAGNOSIS — E119 Type 2 diabetes mellitus without complications: Secondary | ICD-10-CM

## 2011-08-27 DIAGNOSIS — I10 Essential (primary) hypertension: Secondary | ICD-10-CM

## 2011-08-27 DIAGNOSIS — R0902 Hypoxemia: Secondary | ICD-10-CM

## 2011-08-27 LAB — CBC
HCT: 42.6 % (ref 36.0–46.0)
MCHC: 32.4 g/dL (ref 30.0–36.0)
RDW: 14.1 % (ref 11.5–15.5)

## 2011-08-27 LAB — GLUCOSE, CAPILLARY: Glucose-Capillary: 85 mg/dL (ref 70–99)

## 2011-08-27 LAB — BASIC METABOLIC PANEL
BUN: 10 mg/dL (ref 6–23)
GFR calc Af Amer: >90 mL/min (ref >90)
GFR calc non Af Amer: >90 mL/min (ref >90)
Potassium: 3.7 mEq/L (ref 3.5–5.1)

## 2011-08-27 NOTE — Discharge Summary (Signed)
Physician Discharge Summary  Patient ID: [name] MRN: [id number] DOB/AGE: 10-23-1950 61 y.o.  Admit date: 08/25/2011 Discharge date: 08/27/2011  Primary Care Physician:  Pcp Not In System   Discharge Diagnoses:    Patient Active Problem List  Diagnoses  . DIABETES  . KNEE, ARTHRITIS, DEGEN./OSTEO  . TIBIALIS TENDINITIS  . TRIGGER FINGER  . SBO (small bowel obstruction)  . Hypoxia  . Leucocytosis  . HTN (hypertension)  . COPD (chronic obstructive pulmonary disease)    Medication List  As of 08/27/2011  2:54 PM   TAKE these medications         albuterol 108 (90 BASE) MCG/ACT inhaler   Commonly known as: PROVENTIL HFA;VENTOLIN HFA   Inhale 2 puffs into the lungs every 4 (four) hours as needed. For shortness of breath      budesonide-formoterol 160-4.5 MCG/ACT inhaler   Commonly known as: SYMBICORT   Inhale 1 puff into the lungs 2 (two) times daily.      citalopram 20 MG tablet   Commonly known as: CELEXA   Take 40 mg by mouth daily.      enalapril 20 MG tablet   Commonly known as: VASOTEC   Take 20 mg by mouth daily.      glyBURIDE 5 MG tablet   Commonly known as: DIABETA   Take 5 mg by mouth 2 (two) times daily.      insulin glargine 100 UNIT/ML injection   Commonly known as: LANTUS   Inject 10 Units into the skin at bedtime.      loratadine 10 MG tablet   Commonly known as: CLARITIN   Take 10 mg by mouth daily.      metFORMIN 500 MG tablet   Commonly known as: GLUCOPHAGE   Take 1,000 mg by mouth 2 (two) times daily.             Disposition and Follow-up:  Follow up with primary MD and with Surgeon.  Consults:  general surgery  Dr Glenna Fellows, General Surgeon.  Significant Diagnostic Studies:  Abd 1 View (kub)  08/26/2011  *RADIOLOGY REPORT*  Clinical Data: Abdominal pain, nausea.  ABDOMEN - 1 VIEW  Comparison: 08/25/2011  Findings: Continued dilated small bowel loops, not significantly changed.  Central small bowel loops are slightly  decompressed, increasing distention of the left abdominal small bowel loops.  Gas within nondistended colon.  No free air.  IMPRESSION: No significant change in small bowel obstruction pattern.  Original Report Authenticated By: Cyndie Chime, M.D.   Dg Abd Acute W/chest  08/25/2011  *RADIOLOGY REPORT*  Clinical Data: Abdominal pain.  History of small bowel obstruction.  ACUTE ABDOMEN SERIES (ABDOMEN 2 VIEW & CHEST 1 VIEW)  Comparison: 01/05/2011  Findings: Moderate small bowel obstruction with differential air- fluid levels compatible with small bowel obstruction.  Colon is decompressed.  No free air in the peritoneum.  Heart size is normal.  No heart failure or pneumonia.  Lungs are clear.  COPD.  IMPRESSION: Small bowel obstruction.  Original Report Authenticated By: Camelia Phenes, M.D.    Brief H and P: For complete details, refer to admission H and P. However,  in brief, this is a 61 year old female, with known history of ventral hernia secondary to multiple abdominal surgeries, resenting with sudden onset of nausea abdominal distention and what appeared to be incarceration of her usually reducible ventral hernia. She had one episode of vomiting prior to coming to the emergency room. She had a plain  abdominal x-ray that confirmed small bowel obstruction, and was admitted for further evaluation, investigation and management.  Physical Exam: On 08/27/11. General: Comfortable, alert, communicative, fully oriented, not short of breath at rest.  HEENT: No clinical pallor, no jaundice, no conjunctival injection or discharge. Hydration status appears fair.  NECK: Supple, JVP not seen, no carotid bruits, no palpable lymphadenopathy, no palpable goiter.  CHEST: Clinically clear to auscultation, no wheezes, no crackles.  HEART: Sounds 1 and 2 heard, normal, regular, no murmurs.  ABDOMEN: Moderately obese, soft, has old surgical scars, non-tender, no palpable organomegaly, no palpable masses. Ventral hernia  is once again, discernible in hernia sac. Bowel sounds are sluggish.  GENITALIA: Not examined.  LOWER EXTREMITIES: No pitting edema, palpable peripheral pulses.  MUSCULOSKELETAL SYSTEM: Generalized osteoarthritic changes, otherwise, normal.  CENTRAL NERVOUS SYSTEM: No focal neurologic deficit on gross examination.   Hospital Course:  1. Small bowel obstruction: This is mechanical, secondary to incarceration of a ventral hernia, and was successfully reduced in ED. Surgical consultation was provided by Dr Glenna Fellows, and patient was managed with bowel rest, iv fluids and watchful waiting. By 08/26/11 AM, hernia appeared to be protruding into hernial sac, bowel sounds are heard, albeit sluggish, and patient managed to pass flatus. No further vomiting occurred. vomiting was noted overnight. Obstruction now seems partial. Abdominal X-ray of 08/26/11, shows continued dilated small bowel loops, not significantly changed. Central small bowel loops are slightly decompressed, increasing distention of the left abdominal small bowel loops. Gas within nondistended colon. No free air. Despite radiologic findings, patient continued to do well clinically, and per recommendation of the surgical team, diet was gradually advanced on 08/27/11, without relapse of symptoms.   2. Diabetes:This was controlled on sliding scale insulin, during hospitalization.  3 Hypoxia: Patient had a mild hypoxia at the time of presentation, deemed secondary to COPD, without evidence of acute exacerbation. She was managed with broncho-dilator nebulizers and oxygen supplementation, and as of AM of 08/26/11, hypoxia had resolved.  4. Hypertension: BP has remained controlled during the course of hospitalization..  5. Leukocytosis: Patient had a leukocytosis of 14.3 at presentation, most likely from a small bowel obstruction/margination. Wcc trended down, and had normalized at 7.6, by 08/27/11. Patient had no evidence of infection.  Comment: Patient  was clinically stable on 08/27/11, and cleared by surgical team, for discharge.  Time spent on Discharge: 40 mins.  Signed: Calyn Sivils,CHRISTOPHER 08/27/2011, 2:54 PM

## 2011-08-27 NOTE — Progress Notes (Signed)
Discharge home. Home discharge instruction given, no question verbalized.alert and oriented, not in any distress, ambulatory.

## 2011-08-27 NOTE — Progress Notes (Signed)
IV site leaking,patient refused IV to be re-inserted, MD aware.

## 2011-08-27 NOTE — Progress Notes (Signed)
Subjective: No abd pain, +flatus. No belching. No distension.   Objective: Vital signs in last 24 hours: Temp:  [98.3 F (36.8 C)-98.7 F (37.1 C)] 98.3 F (36.8 C) (05/09 0557) Pulse Rate:  [68-79] 74  (05/09 0557) Resp:  [18] 18  (05/09 0557) BP: (106-120)/(52-54) 106/54 mmHg (05/09 0557) SpO2:  [90 %-96 %] 93 % (05/09 0557) Last BM Date: 08/27/11  Intake/Output from previous day: 05/08 0701 - 05/09 0700 In: 3137.1 [I.V.:3137.1] Out: 450 [Urine:450] Intake/Output this shift:    Alert, nad Sitting in  Chair, abd soft, nt, nd, multiple old incsions. Hernia reducible. No guarding. No rebound.  Lab Results:   Basename 08/27/11 0550 08/26/11 0622  WBC 7.6 12.5*  HGB 13.8 14.0  HCT 42.6 41.5  PLT 187 211   BMET  Basename 08/27/11 0550 08/26/11 0622  NA 141 138  K 3.7 4.1  CL 108 104  CO2 26 25  GLUCOSE 95 135*  BUN 10 16  CREATININE 0.71 0.63  CALCIUM 8.6 8.7   PT/INR No results found for this basename: LABPROT:2,INR:2 in the last 72 hours ABG No results found for this basename: PHART:2,PCO2:2,PO2:2,HCO3:2 in the last 72 hours  Studies/Results: Abd 1 View (kub)  08/26/2011  *RADIOLOGY REPORT*  Clinical Data: Abdominal pain, nausea.  ABDOMEN - 1 VIEW  Comparison: 08/25/2011  Findings: Continued dilated small bowel loops, not significantly changed.  Central small bowel loops are slightly decompressed, increasing distention of the left abdominal small bowel loops.  Gas within nondistended colon.  No free air.  IMPRESSION: No significant change in small bowel obstruction pattern.  Original Report Authenticated By: Cyndie Chime, M.D.   Dg Abd Acute W/chest  08/25/2011  *RADIOLOGY REPORT*  Clinical Data: Abdominal pain.  History of small bowel obstruction.  ACUTE ABDOMEN SERIES (ABDOMEN 2 VIEW & CHEST 1 VIEW)  Comparison: 01/05/2011  Findings: Moderate small bowel obstruction with differential air- fluid levels compatible with small bowel obstruction.  Colon is  decompressed.  No free air in the peritoneum.  Heart size is normal.  No heart failure or pneumonia.  Lungs are clear.  COPD.  IMPRESSION: Small bowel obstruction.  Original Report Authenticated By: Camelia Phenes, M.D.    Anti-infectives: Anti-infectives    None      Assessment/Plan: s/p * No surgery found * psbo secondary to adhesive disease Recurrent incisional ventral hernia  Pt is currently asymptomatic, having bms, no abd pain, no n/v. I think the hernia is the least of the issues. Her primary prob appears to be recurrent psbo due most likely to adhesions and not her hernia. She and i had an extensive discussion regarding long term mgmt of this. We both agreed to a trial of liquids. If tolerates clears, will adv to fulls. If tolerates, she can go home later today. She desparately wants to go home b/c her anniversary is today.  Long term, she is to followup with in the clinic to discuss surgery again. As we discussed today, she is at high risk for enterotomies and EC fistula and hernia recurrence. Ideally, she needs to lose additional wt, stop smoking, and be in good CV shape for planned elective repair. Currently, the pt wants to avoid surgery. She knows that she is at risk for recurrent psbo. We discussed the signs and symptoms of recurrent sbo.   Hopefully shell tolerate po trial this am.   [name]. Andrey Campanile, MD, FACS General, Bariatric, & Minimally Invasive Surgery [facility]   LOS:  2 days    Atilano Ina 08/27/2011

## 2012-01-07 ENCOUNTER — Encounter: Payer: Self-pay | Admitting: *Deleted

## 2012-10-01 ENCOUNTER — Encounter: Payer: Self-pay | Admitting: *Deleted

## 2012-10-03 ENCOUNTER — Encounter: Payer: Self-pay | Admitting: *Deleted

## 2012-10-27 ENCOUNTER — Other Ambulatory Visit: Payer: Self-pay

## 2013-02-09 ENCOUNTER — Other Ambulatory Visit (HOSPITAL_COMMUNITY): Payer: Self-pay | Admitting: Family Medicine

## 2013-02-09 DIAGNOSIS — Z139 Encounter for screening, unspecified: Secondary | ICD-10-CM

## 2013-02-13 ENCOUNTER — Ambulatory Visit (HOSPITAL_COMMUNITY)
Admission: RE | Admit: 2013-02-13 | Discharge: 2013-02-13 | Disposition: A | Discharge status: Home / Self Care | Payer: Medicare Other | Source: Ambulatory Visit | Attending: Family Medicine | Admitting: Family Medicine

## 2013-02-13 DIAGNOSIS — Z1231 Encounter for screening mammogram for malignant neoplasm of breast: Secondary | ICD-10-CM | POA: Insufficient documentation

## 2013-02-13 DIAGNOSIS — Z139 Encounter for screening, unspecified: Secondary | ICD-10-CM

## 2013-04-27 ENCOUNTER — Emergency Department (HOSPITAL_COMMUNITY): Payer: Medicare HMO

## 2013-04-27 ENCOUNTER — Encounter (HOSPITAL_COMMUNITY): Payer: Self-pay | Admitting: Emergency Medicine

## 2013-04-27 ENCOUNTER — Emergency Department (HOSPITAL_COMMUNITY)
Admission: EM | Admit: 2013-04-27 | Discharge: 2013-04-27 | Disposition: A | Discharge status: Home / Self Care | Payer: Medicare HMO | Attending: General Surgery | Admitting: General Surgery

## 2013-04-27 DIAGNOSIS — K460 Unspecified abdominal hernia with obstruction, without gangrene: Secondary | ICD-10-CM

## 2013-04-27 DIAGNOSIS — Z9889 Other specified postprocedural states: Secondary | ICD-10-CM | POA: Insufficient documentation

## 2013-04-27 DIAGNOSIS — R142 Eructation: Secondary | ICD-10-CM | POA: Insufficient documentation

## 2013-04-27 DIAGNOSIS — Z9089 Acquired absence of other organs: Secondary | ICD-10-CM | POA: Insufficient documentation

## 2013-04-27 DIAGNOSIS — R141 Gas pain: Secondary | ICD-10-CM | POA: Insufficient documentation

## 2013-04-27 DIAGNOSIS — J4489 Other specified chronic obstructive pulmonary disease: Secondary | ICD-10-CM | POA: Insufficient documentation

## 2013-04-27 DIAGNOSIS — Z860100 Personal history of colon polyps, unspecified: Secondary | ICD-10-CM | POA: Insufficient documentation

## 2013-04-27 DIAGNOSIS — K5900 Constipation, unspecified: Secondary | ICD-10-CM | POA: Insufficient documentation

## 2013-04-27 DIAGNOSIS — IMO0002 Reserved for concepts with insufficient information to code with codable children: Secondary | ICD-10-CM

## 2013-04-27 DIAGNOSIS — M1710 Unilateral primary osteoarthritis, unspecified knee: Secondary | ICD-10-CM | POA: Insufficient documentation

## 2013-04-27 DIAGNOSIS — R112 Nausea with vomiting, unspecified: Secondary | ICD-10-CM | POA: Insufficient documentation

## 2013-04-27 DIAGNOSIS — R000 Tachycardia, unspecified: Secondary | ICD-10-CM | POA: Insufficient documentation

## 2013-04-27 DIAGNOSIS — K430 Incisional hernia with obstruction, without gangrene: Secondary | ICD-10-CM | POA: Insufficient documentation

## 2013-04-27 DIAGNOSIS — F17200 Nicotine dependence, unspecified, uncomplicated: Secondary | ICD-10-CM | POA: Insufficient documentation

## 2013-04-27 DIAGNOSIS — J449 Chronic obstructive pulmonary disease, unspecified: Secondary | ICD-10-CM | POA: Insufficient documentation

## 2013-04-27 DIAGNOSIS — E785 Hyperlipidemia, unspecified: Secondary | ICD-10-CM | POA: Insufficient documentation

## 2013-04-27 DIAGNOSIS — K436 Other and unspecified ventral hernia with obstruction, without gangrene: Secondary | ICD-10-CM | POA: Diagnosis present

## 2013-04-27 DIAGNOSIS — I10 Essential (primary) hypertension: Secondary | ICD-10-CM | POA: Insufficient documentation

## 2013-04-27 DIAGNOSIS — M19049 Primary osteoarthritis, unspecified hand: Secondary | ICD-10-CM | POA: Insufficient documentation

## 2013-04-27 DIAGNOSIS — R42 Dizziness and giddiness: Secondary | ICD-10-CM | POA: Insufficient documentation

## 2013-04-27 DIAGNOSIS — Z880 Allergy status to penicillin: Secondary | ICD-10-CM | POA: Insufficient documentation

## 2013-04-27 DIAGNOSIS — Z8701 Personal history of pneumonia (recurrent): Secondary | ICD-10-CM | POA: Insufficient documentation

## 2013-04-27 DIAGNOSIS — R143 Flatulence: Secondary | ICD-10-CM

## 2013-04-27 DIAGNOSIS — E119 Type 2 diabetes mellitus without complications: Secondary | ICD-10-CM | POA: Insufficient documentation

## 2013-04-27 DIAGNOSIS — Z79899 Other long term (current) drug therapy: Secondary | ICD-10-CM | POA: Insufficient documentation

## 2013-04-27 LAB — CBC WITH DIFFERENTIAL/PLATELET
BASOS ABS: 0 10*3/uL (ref 0.0–0.1)
BASOS PCT: 0 % (ref 0–1)
EOS ABS: 0 10*3/uL (ref 0.0–0.7)
EOS PCT: 0 % (ref 0–5)
HCT: 47.2 % — ABNORMAL HIGH (ref 36.0–46.0)
Hemoglobin: 15.9 g/dL — ABNORMAL HIGH (ref 12.0–15.0)
Lymphocytes Relative: 17 % (ref 12–46)
Lymphs Abs: 1.4 10*3/uL (ref 0.7–4.0)
MCH: 28.8 pg (ref 26.0–34.0)
MCHC: 33.7 g/dL (ref 30.0–36.0)
MCV: 85.4 fL (ref 78.0–100.0)
Monocytes Absolute: 0.6 10*3/uL (ref 0.1–1.0)
Monocytes Relative: 8 % (ref 3–12)
NEUTROS PCT: 75 % (ref 43–77)
Neutro Abs: 6.1 10*3/uL (ref 1.7–7.7)
PLATELETS: 222 10*3/uL (ref 150–400)
RBC: 5.53 MIL/uL — ABNORMAL HIGH (ref 3.87–5.11)
RDW: 13.4 % (ref 11.5–15.5)
WBC: 8.1 10*3/uL (ref 4.0–10.5)

## 2013-04-27 LAB — COMPREHENSIVE METABOLIC PANEL
ALBUMIN: 3.5 g/dL (ref 3.5–5.2)
ALT: 6 U/L (ref 0–35)
AST: 6 U/L (ref 0–37)
Alkaline Phosphatase: 62 U/L (ref 39–117)
BUN: 51 mg/dL — ABNORMAL HIGH (ref 6–23)
CALCIUM: 9.2 mg/dL (ref 8.4–10.5)
CO2: 26 mEq/L (ref 19–32)
Chloride: 91 mEq/L — ABNORMAL LOW (ref 96–112)
Creatinine, Ser: 0.91 mg/dL (ref 0.50–1.10)
GFR calc non Af Amer: 67 mL/min — ABNORMAL LOW (ref >90)
GFR, EST AFRICAN AMERICAN: 78 mL/min — AB (ref >90)
Glucose, Bld: 273 mg/dL — ABNORMAL HIGH (ref 70–99)
POTASSIUM: 4.8 meq/L (ref 3.7–5.3)
SODIUM: 132 meq/L — AB (ref 137–147)
TOTAL PROTEIN: 7 g/dL (ref 6.0–8.3)
Total Bilirubin: 0.6 mg/dL (ref 0.3–1.2)

## 2013-04-27 LAB — CG4 I-STAT (LACTIC ACID): LACTIC ACID, VENOUS: 1.14 mmol/L (ref 0.5–2.2)

## 2013-04-27 LAB — LIPASE, BLOOD: LIPASE: 20 U/L (ref 11–59)

## 2013-04-27 MED ORDER — ONDANSETRON HCL 4 MG/2ML IJ SOLN
4.0000 mg | Freq: Once | INTRAMUSCULAR | Status: AC
  Administered 2013-04-27: 4 mg via INTRAVENOUS
  Filled 2013-04-27: qty 2

## 2013-04-27 MED ORDER — SODIUM CHLORIDE 0.9 % IV BOLUS (SEPSIS)
1000.0000 mL | Freq: Once | INTRAVENOUS | Status: AC
  Administered 2013-04-27: 1000 mL via INTRAVENOUS

## 2013-04-27 MED ORDER — ONDANSETRON 4 MG PO TBDP: 4.0000 mg | ORAL_TABLET | Freq: Three times a day (TID) | ORAL | Status: DC | PRN

## 2013-04-27 MED ORDER — MORPHINE SULFATE 4 MG/ML IJ SOLN
4.0000 mg | INTRAMUSCULAR | Status: DC | PRN
  Administered 2013-04-27 (×2): 4 mg via INTRAVENOUS
  Filled 2013-04-27 (×2): qty 1

## 2013-04-27 NOTE — ED Provider Notes (Signed)
CSN: [id number]     Arrival date & time 04/27/13  1008 History  This chart was scribed for Tanna Furry, MD by Roxan Diesel, ED scribe.  This patient was seen in room APA16A/APA16A and the patient's care was started at 10:51 AM.   Chief Complaint  Patient presents with  . Hernia    The history is provided by the patient. No language interpreter was used.    HPI Comments: [name] is a 63 y.o. female with h/o ventral hernia in RLQ abdomen, SBO, DM, HTN, hyperlipidemia, and COPD who presents to the Emergency Department complaining of 3 days exacerbation of her hernia pain, with associated constipation, nausea and vomiting.  Pt states she feels as if her hernia has been growing larger and it appears to be bulging out.  It has not gone back in since then and pain has been constant.  Pain is waxing-and-waning and pt also complains of nausea and vomiting that occur in "waves" associated with her pain.  Pt states she was moving her bowels normally for the past 3 days until today, when she has not moved her bowels and has not been passing gas.  She states her abdomen currently feels bloated and distended.  She denies hematemesis or fever.  Pt was seen by her PCP today and was advised that she appears dehydrated.  She was given anti-emetics and advised to come to the ED for IV fluids and possible bloodwork.   Pt has h/o hernia repair surgery.  She has not seen a Psychologist, sport and exercise in 2 years.  Pt has not checked her CBG in 4 days and has not been able to tolerate any of her medications.   Past Medical History  Diagnosis Date  . Hypertension   . COPD (chronic obstructive pulmonary disease)   . Type II diabetes mellitus   . Arthritis     "hands, knees, feet"  . Pneumonia 08/2010; 11/2010  . SBO (small bowel obstruction) 08/25/11    recurrent  . Asthma   . Peripheral edema   . Hyperlipidemia   . Colon polyps     Past Surgical History  Procedure Laterality Date  . Laparoscopic incisional / umbilical /  ventral hernia repair  ? til 2002    "~ 13 times"  . Cholecystectomy  1979  . Abdominal hysterectomy  1972  . Tubal ligation  1978  . Hernia repair    . Knee cartilage surgery  07/2003    right/E-chart  . Appendectomy    . Esophageal dilation      Family History  Problem Relation Age of Onset  . Cancer Mother     Colon    History  Substance Use Topics  . Smoking status: Current Every Day Smoker -- 1.00 packs/day for 42 years    Types: Cigarettes  . Smokeless tobacco: Never Used  . Alcohol Use: No    OB History   Grav Para Term Preterm Abortions TAB SAB Ect Mult Living                  Review of Systems  Constitutional: Negative for fever.  HENT: Negative for mouth sores, sore throat and trouble swallowing.   Eyes: Negative for visual disturbance.  Respiratory: Negative for cough, chest tightness, shortness of breath and wheezing.   Cardiovascular: Negative for chest pain.  Gastrointestinal: Positive for nausea, vomiting, abdominal pain (associated with hernia), constipation (no BM or flatulence today) and abdominal distention (subjective). Negative for diarrhea.  Endocrine: Negative  for polydipsia, polyphagia and polyuria.  Genitourinary: Negative for dysuria, frequency and hematuria.  Musculoskeletal: Negative for gait problem.  Skin: Negative for color change, pallor and rash.  Neurological: Positive for dizziness and light-headedness. Negative for syncope, weakness, numbness and headaches.  Hematological: Does not bruise/bleed easily.  Psychiatric/Behavioral: Negative for behavioral problems and confusion.     Allergies  Penicillins and Vicodin  Home Medications   Current Outpatient Rx  Name  Route  Sig  Dispense  Refill  . albuterol (PROVENTIL HFA;VENTOLIN HFA) 108 (90 BASE) MCG/ACT inhaler   Inhalation   Inhale 2 puffs into the lungs every 4 (four) hours as needed. For shortness of breath         . budesonide-formoterol (SYMBICORT) 160-4.5 MCG/ACT  inhaler   Inhalation   Inhale 1 puff into the lungs 2 (two) times daily.         . citalopram (CELEXA) 40 MG tablet   Oral   Take 40 mg by mouth daily.         . enalapril (VASOTEC) 20 MG tablet   Oral   Take 20 mg by mouth daily.         Marland Kitchen gabapentin (NEURONTIN) 100 MG capsule   Oral   Take 100 mg by mouth 2 (two) times daily.         Marland Kitchen glyBURIDE (DIABETA) 5 MG tablet   Oral   Take 5 mg by mouth 2 (two) times daily.         Marland Kitchen loratadine (CLARITIN) 10 MG tablet   Oral   Take 10 mg by mouth daily.         Marland Kitchen lovastatin (MEVACOR) 20 MG tablet   Oral   Take 20 mg by mouth at bedtime.         . metFORMIN (GLUCOPHAGE) 500 MG tablet   Oral   Take 1,000 mg by mouth 2 (two) times daily.         . ondansetron (ZOFRAN ODT) 4 MG disintegrating tablet   Oral   Take 1 tablet (4 mg total) by mouth every 8 (eight) hours as needed for nausea.   6 tablet   0    BP 143/64  Pulse 106  Resp 18  Ht 5\' 4"  (1.626 m)  Wt 204 lb (92.534 kg)  BMI 35.00 kg/m2  SpO2 92%  Physical Exam  Nursing note and vitals reviewed. Constitutional: She is oriented to person, place, and time. She appears well-developed and well-nourished. No distress.  HENT:  Head: Normocephalic.  Mouth/Throat: Mucous membranes are dry.  Eyes: Conjunctivae are normal. Pupils are equal, round, and reactive to light. No scleral icterus.  Neck: Normal range of motion. Neck supple. No thyromegaly present.  Cardiovascular: Regular rhythm.  Tachycardia present.  Exam reveals no gallop and no friction rub.   No murmur heard. Pulmonary/Chest: Effort normal and breath sounds normal. No respiratory distress. She has no wheezes. She has no rales.  Abdominal: Soft. She exhibits no distension. Bowel sounds are decreased. There is tenderness (mild, generalized). There is no rebound.  Obvious 12-cm-diameter ventral hernia right lower abdomen.  Irreducible after 2 attempts. Occasional high-pitched bowel sounds.   Musculoskeletal: Normal range of motion.  Neurological: She is alert and oriented to person, place, and time.  Skin: Skin is warm and dry. No rash noted.  Psychiatric: She has a normal mood and affect. Her behavior is normal.    ED Course  Procedures (including critical care time)  DIAGNOSTIC STUDIES: Oxygen  Saturation is 92% on room air, low by my interpretation.    COORDINATION OF CARE: 11:00 AM-Discussed treatment plan with pt at bedside and pt agreed to plan.    Labs Review Labs Reviewed  CBC WITH DIFFERENTIAL - Abnormal; Notable for the following:    RBC 5.53 (*)    Hemoglobin 15.9 (*)    HCT 47.2 (*)    All other components within normal limits  COMPREHENSIVE METABOLIC PANEL - Abnormal; Notable for the following:    Sodium 132 (*)    Chloride 91 (*)    Glucose, Bld 273 (*)    BUN 51 (*)    GFR calc non Af Amer 67 (*)    GFR calc Af Amer 78 (*)    All other components within normal limits  LIPASE, BLOOD  URINALYSIS, ROUTINE W REFLEX MICROSCOPIC  CG4 I-STAT (LACTIC ACID)    Imaging Review Dg Abd Acute W/chest  04/27/2013   CLINICAL DATA:  Abdominal pain and nausea  EXAM: ACUTE ABDOMEN SERIES (ABDOMEN 2 VIEW & CHEST 1 VIEW)  COMPARISON:  08/26/2011  FINDINGS: Chest: The lungs are clear. Negative for pneumonia or heart failure.  Abdomen: Marked dilatation of the small bowel consistent with high-grade small bowel obstruction. Colon is decompressed. No free air.  IMPRESSION: High-grade small bowel obstruction.   Electronically Signed   By: Franchot Gallo M.D.   On: 04/27/2013 12:19    EKG Interpretation   None       MDM   1. Incarcerated hernia    After multiple attempts I was unable to reduce her hernia. Clinically and radiographically has obstruction. Discussed case with Dr. Arnoldo Morale or general surgery. Dr. Arnoldo Morale as always return my call immediately. He mainly presents to the emergency room is able to obtain reduction she felt immediately better. He drinking  some by mouth liquids. Dr. Arnoldo Morale felt is appropriate for discharge home. This time I do as well. We'll observe her short time she continues to tolerate by mouth the plan will be outpatient treatment, primary care followup. Pt was given name and information for consult a neurosurgeon at [facility] by Dr. Arnoldo Morale.    I personally performed the services described in this documentation, which was scribed in my presence. The recorded information has been reviewed and is accurate. ent}    Tanna Furry, MD 04/27/13 1351

## 2013-04-27 NOTE — Discharge Instructions (Signed)
Hernia A hernia occurs when an internal organ pushes out through a weak spot in the abdominal wall. Hernias most commonly occur in the groin and around the navel. Hernias often can be pushed back into place (reduced). Most hernias tend to get worse over time. Some abdominal hernias can get stuck in the opening (irreducible or incarcerated hernia) and cannot be reduced. An irreducible abdominal hernia which is tightly squeezed into the opening is at risk for impaired blood supply (strangulated hernia). A strangulated hernia is a medical emergency. Because of the risk for an irreducible or strangulated hernia, surgery may be recommended to repair a hernia. CAUSES   Heavy lifting.  Prolonged coughing.  Straining to have a bowel movement.  A cut (incision) made during an abdominal surgery. HOME CARE INSTRUCTIONS   Bed rest is not required. You may continue your normal activities.  Avoid lifting more than 10 pounds (4.5 kg) or straining.  Cough gently. If you are a smoker it is best to stop. Even the best hernia repair can break down with the continual strain of coughing. Even if you do not have your hernia repaired, a cough will continue to aggravate the problem.  Do not wear anything tight over your hernia. Do not try to keep it in with an outside bandage or truss. These can damage abdominal contents if they are trapped within the hernia sac.  Eat a normal diet.  Avoid constipation. Straining over long periods of time will increase hernia size and encourage breakdown of repairs. If you cannot do this with diet alone, stool softeners may be used. SEEK IMMEDIATE MEDICAL CARE IF:   You have a fever.  You develop increasing abdominal pain.  You feel nauseous or vomit.  Your hernia is stuck outside the abdomen, looks discolored, feels hard, or is tender.  You have any changes in your bowel habits or in the hernia that are unusual for you.  You have increased pain or swelling around the  hernia.  You cannot push the hernia back in place by applying gentle pressure while lying down. MAKE SURE YOU:   Understand these instructions.  Will watch your condition.  Will get help right away if you are not doing well or get worse. Document Released: 04/06/2005 Document Revised: 06/29/2011 Document Reviewed: 11/24/2007 [employer] Patient Information 2014 [employer].  

## 2013-04-27 NOTE — ED Notes (Signed)
Pt states hernia has been hurting since Monday along with vomiting. States hernia is larger than usual. Sent over by Dr. Gerarda Fraction. Hernia was not attempted to be reduced in office. Pt states vomiting is coming from pain. She states she was sent over for fluids.

## 2013-04-27 NOTE — Consult Note (Signed)
Reason for Consult: Incarcerated incisional hernia Referring Physician: ER  [name] is an 63 y.o. female.  HPI: Patient is a 63 year old white female with multiple medical problems who has had multiple abdominal surgeries in the past to fix her ventral hernias. She has had a chronic hernia at the umbilical level for many years. She apparently was evaluated by [facility] surgical 2 years ago who told her that they could not fix the hernia. She presents with a three-day history of worsening abdominal pain and nausea with the hernia sticking out. She was unable to reduce it.  Past Medical History  Diagnosis Date  . Hypertension   . COPD (chronic obstructive pulmonary disease)   . Type II diabetes mellitus   . Arthritis     "hands, knees, feet"  . Pneumonia 08/2010; 11/2010  . SBO (small bowel obstruction) 08/25/11    recurrent  . Asthma   . Peripheral edema   . Hyperlipidemia   . Colon polyps     Past Surgical History  Procedure Laterality Date  . Laparoscopic incisional / umbilical / ventral hernia repair  ? til 2002    "~ 13 times"  . Cholecystectomy  1979  . Abdominal hysterectomy  1972  . Tubal ligation  1978  . Hernia repair    . Knee cartilage surgery  07/2003    right/E-chart  . Appendectomy    . Esophageal dilation      Family History  Problem Relation Age of Onset  . Cancer Mother     Colon    Social History:  reports that she has been smoking Cigarettes.  She has a 42 pack-year smoking history. She has never used smokeless tobacco. She reports that she does not drink alcohol or use illicit drugs.  Allergies:  Allergies  Allergen Reactions  . Penicillins Hives  . Vicodin [Hydrocodone-Acetaminophen]     itching    Medications: I have reviewed the patient's current medications.  Results for orders placed during the hospital encounter of 04/27/13 (from the past 48 hour(s))  CBC WITH DIFFERENTIAL     Status: Abnormal   Collection Time    04/27/13  11:17 AM      Result Value Range   WBC 8.1  4.0 - 10.5 K/uL   RBC 5.53 (*) 3.87 - 5.11 MIL/uL   Hemoglobin 15.9 (*) 12.0 - 15.0 g/dL   HCT 47.2 (*) 36.0 - 46.0 %   MCV 85.4  78.0 - 100.0 fL   MCH 28.8  26.0 - 34.0 pg   MCHC 33.7  30.0 - 36.0 g/dL   RDW 13.4  11.5 - 15.5 %   Platelets 222  150 - 400 K/uL   Neutrophils Relative % 75  43 - 77 %   Neutro Abs 6.1  1.7 - 7.7 K/uL   Lymphocytes Relative 17  12 - 46 %   Lymphs Abs 1.4  0.7 - 4.0 K/uL   Monocytes Relative 8  3 - 12 %   Monocytes Absolute 0.6  0.1 - 1.0 K/uL   Eosinophils Relative 0  0 - 5 %   Eosinophils Absolute 0.0  0.0 - 0.7 K/uL   Basophils Relative 0  0 - 1 %   Basophils Absolute 0.0  0.0 - 0.1 K/uL  COMPREHENSIVE METABOLIC PANEL     Status: Abnormal   Collection Time    04/27/13 11:17 AM      Result Value Range   Sodium 132 (*) 137 - 147 mEq/L  Potassium 4.8  3.7 - 5.3 mEq/L   Chloride 91 (*) 96 - 112 mEq/L   CO2 26  19 - 32 mEq/L   Glucose, Bld 273 (*) 70 - 99 mg/dL   BUN 51 (*) 6 - 23 mg/dL   Creatinine, Ser 0.91  0.50 - 1.10 mg/dL   Calcium 9.2  8.4 - 10.5 mg/dL   Total Protein 7.0  6.0 - 8.3 g/dL   Albumin 3.5  3.5 - 5.2 g/dL   AST 6  0 - 37 U/L   ALT 6  0 - 35 U/L   Alkaline Phosphatase 62  39 - 117 U/L   Total Bilirubin 0.6  0.3 - 1.2 mg/dL   GFR calc non Af Amer 67 (*) >90 mL/min   GFR calc Af Amer 78 (*) >90 mL/min   Comment: (NOTE)     The eGFR has been calculated using the CKD EPI equation.     This calculation has not been validated in all clinical situations.     eGFR's persistently <90 mL/min signify possible Chronic Kidney     Disease.  LIPASE, BLOOD     Status: None   Collection Time    04/27/13 11:17 AM      Result Value Range   Lipase 20  11 - 59 U/L  CG4 I-STAT (LACTIC ACID)     Status: None   Collection Time    04/27/13 11:48 AM      Result Value Range   Lactic Acid, Venous 1.14  0.5 - 2.2 mmol/L    Dg Abd Acute W/chest  04/27/2013   CLINICAL DATA:  Abdominal pain and nausea   EXAM: ACUTE ABDOMEN SERIES (ABDOMEN 2 VIEW & CHEST 1 VIEW)  COMPARISON:  08/26/2011  FINDINGS: Chest: The lungs are clear. Negative for pneumonia or heart failure.  Abdomen: Marked dilatation of the small bowel consistent with high-grade small bowel obstruction. Colon is decompressed. No free air.  IMPRESSION: High-grade small bowel obstruction.   Electronically Signed   By: Franchot Gallo M.D.   On: 04/27/2013 12:19    ROS: See chart Blood pressure 143/64, pulse 106, temperature 97.6 F (36.4 C), temperature source Oral, resp. rate 18, height $RemoveBe'5\' 4"'SbdNrbqUm$  (1.626 m), weight 92.534 kg (204 lb), SpO2 92.00%. Physical Exam: Obese white female who appears uncomfortable, vital signs stable. Abdomen is soft with multiple surgical scars present. She does have an incisional hernia to the right of a surgical scar at approximate level of the umbilicus. I was able to reduce the hernia. She tolerated the procedure well. Her abdomen is soft. The defect appears to be about 5-6 cm its greatest diameter, with probable palpable mesh superior to it.  Assessment/Plan: Impression: Chronic incisional hernia with acute episode of incarceration, resolved Plan: Patient wishes to go home, which is fine with me. I have told her that she needs to be evaluated by Dr. Alvan Dame at [facility] for surgical evaluation of her recurrent incisional hernia. Given her complex surgical history, I don't think I will be able to help her here at [facility]. She understands and agrees.  Dimitry Holsworth A 04/27/2013, 1:16 PM

## 2013-04-27 NOTE — ED Notes (Signed)
Pt receiving IV fluids due to tachycardia before being discharged.  PO trial attempted with sprite.  Pt sipping and tolerating well.

## 2014-02-01 ENCOUNTER — Other Ambulatory Visit (HOSPITAL_COMMUNITY): Payer: Self-pay | Admitting: Family Medicine

## 2014-02-01 DIAGNOSIS — Z1231 Encounter for screening mammogram for malignant neoplasm of breast: Secondary | ICD-10-CM

## 2014-02-15 ENCOUNTER — Ambulatory Visit (HOSPITAL_COMMUNITY)
Admission: RE | Admit: 2014-02-15 | Discharge: 2014-02-15 | Disposition: A | Discharge status: Home / Self Care | Payer: Commercial Managed Care - HMO | Source: Ambulatory Visit | Attending: Family Medicine | Admitting: Family Medicine

## 2014-02-15 DIAGNOSIS — Z1231 Encounter for screening mammogram for malignant neoplasm of breast: Secondary | ICD-10-CM | POA: Diagnosis present

## 2014-04-27 ENCOUNTER — Other Ambulatory Visit (HOSPITAL_COMMUNITY): Payer: Self-pay | Admitting: Family Medicine

## 2014-04-27 DIAGNOSIS — R05 Cough: Secondary | ICD-10-CM

## 2014-04-27 DIAGNOSIS — R059 Cough, unspecified: Secondary | ICD-10-CM

## 2014-04-30 ENCOUNTER — Ambulatory Visit (HOSPITAL_COMMUNITY)
Admission: RE | Admit: 2014-04-30 | Discharge: 2014-04-30 | Disposition: A | Discharge status: Home / Self Care | Payer: Commercial Managed Care - HMO | Source: Ambulatory Visit | Attending: Family Medicine | Admitting: Family Medicine

## 2014-04-30 DIAGNOSIS — R509 Fever, unspecified: Secondary | ICD-10-CM | POA: Insufficient documentation

## 2014-04-30 DIAGNOSIS — R05 Cough: Secondary | ICD-10-CM | POA: Diagnosis not present

## 2014-04-30 DIAGNOSIS — R059 Cough, unspecified: Secondary | ICD-10-CM

## 2014-04-30 DIAGNOSIS — J40 Bronchitis, not specified as acute or chronic: Secondary | ICD-10-CM | POA: Diagnosis not present

## 2014-05-02 DIAGNOSIS — Z9889 Other specified postprocedural states: Secondary | ICD-10-CM | POA: Diagnosis not present

## 2014-05-02 DIAGNOSIS — K430 Incisional hernia with obstruction, without gangrene: Secondary | ICD-10-CM | POA: Diagnosis not present

## 2014-05-02 DIAGNOSIS — T148 Other injury of unspecified body region: Secondary | ICD-10-CM | POA: Diagnosis not present

## 2014-05-02 DIAGNOSIS — Z8719 Personal history of other diseases of the digestive system: Secondary | ICD-10-CM | POA: Diagnosis not present

## 2014-05-08 DIAGNOSIS — Z6379 Other stressful life events affecting family and household: Secondary | ICD-10-CM | POA: Diagnosis not present

## 2014-05-08 DIAGNOSIS — T8189XS Other complications of procedures, not elsewhere classified, sequela: Secondary | ICD-10-CM | POA: Diagnosis not present

## 2014-05-08 DIAGNOSIS — J209 Acute bronchitis, unspecified: Secondary | ICD-10-CM | POA: Diagnosis not present

## 2014-05-08 DIAGNOSIS — Z6831 Body mass index (BMI) 31.0-31.9, adult: Secondary | ICD-10-CM | POA: Diagnosis not present

## 2014-05-30 DIAGNOSIS — Z09 Encounter for follow-up examination after completed treatment for conditions other than malignant neoplasm: Secondary | ICD-10-CM | POA: Diagnosis not present

## 2014-06-06 DIAGNOSIS — E6609 Other obesity due to excess calories: Secondary | ICD-10-CM | POA: Diagnosis not present

## 2014-06-06 DIAGNOSIS — Z6832 Body mass index (BMI) 32.0-32.9, adult: Secondary | ICD-10-CM | POA: Diagnosis not present

## 2014-06-06 DIAGNOSIS — F4320 Adjustment disorder, unspecified: Secondary | ICD-10-CM | POA: Diagnosis not present

## 2014-06-15 DIAGNOSIS — S39012A Strain of muscle, fascia and tendon of lower back, initial encounter: Secondary | ICD-10-CM | POA: Diagnosis not present

## 2014-06-15 DIAGNOSIS — E6609 Other obesity due to excess calories: Secondary | ICD-10-CM | POA: Diagnosis not present

## 2014-06-15 DIAGNOSIS — Z6832 Body mass index (BMI) 32.0-32.9, adult: Secondary | ICD-10-CM | POA: Diagnosis not present

## 2014-06-15 DIAGNOSIS — F17290 Nicotine dependence, other tobacco product, uncomplicated: Secondary | ICD-10-CM | POA: Diagnosis not present

## 2014-07-09 DIAGNOSIS — J180 Bronchopneumonia, unspecified organism: Secondary | ICD-10-CM | POA: Diagnosis not present

## 2014-07-09 DIAGNOSIS — F17200 Nicotine dependence, unspecified, uncomplicated: Secondary | ICD-10-CM | POA: Diagnosis not present

## 2014-07-09 DIAGNOSIS — Z6832 Body mass index (BMI) 32.0-32.9, adult: Secondary | ICD-10-CM | POA: Diagnosis not present

## 2014-08-06 DIAGNOSIS — J301 Allergic rhinitis due to pollen: Secondary | ICD-10-CM | POA: Diagnosis not present

## 2014-08-06 DIAGNOSIS — E6609 Other obesity due to excess calories: Secondary | ICD-10-CM | POA: Diagnosis not present

## 2014-08-06 DIAGNOSIS — Z6830 Body mass index (BMI) 30.0-30.9, adult: Secondary | ICD-10-CM | POA: Diagnosis not present

## 2014-08-06 DIAGNOSIS — J180 Bronchopneumonia, unspecified organism: Secondary | ICD-10-CM | POA: Diagnosis not present

## 2014-08-27 DIAGNOSIS — Z09 Encounter for follow-up examination after completed treatment for conditions other than malignant neoplasm: Secondary | ICD-10-CM | POA: Diagnosis not present

## 2014-10-23 ENCOUNTER — Other Ambulatory Visit (HOSPITAL_COMMUNITY): Payer: Self-pay | Admitting: Family Medicine

## 2014-10-23 DIAGNOSIS — Z6833 Body mass index (BMI) 33.0-33.9, adult: Secondary | ICD-10-CM | POA: Diagnosis not present

## 2014-10-23 DIAGNOSIS — E1165 Type 2 diabetes mellitus with hyperglycemia: Secondary | ICD-10-CM | POA: Diagnosis not present

## 2014-10-23 DIAGNOSIS — Z1389 Encounter for screening for other disorder: Secondary | ICD-10-CM | POA: Diagnosis not present

## 2014-10-23 DIAGNOSIS — M79605 Pain in left leg: Secondary | ICD-10-CM

## 2014-10-23 DIAGNOSIS — E6609 Other obesity due to excess calories: Secondary | ICD-10-CM | POA: Diagnosis not present

## 2014-10-24 ENCOUNTER — Ambulatory Visit (HOSPITAL_COMMUNITY)
Admission: RE | Admit: 2014-10-24 | Discharge: 2014-10-24 | Disposition: A | Discharge status: Home / Self Care | Payer: Commercial Managed Care - HMO | Source: Ambulatory Visit | Attending: Family Medicine | Admitting: Family Medicine

## 2014-10-24 DIAGNOSIS — M79605 Pain in left leg: Secondary | ICD-10-CM | POA: Insufficient documentation

## 2015-01-11 DIAGNOSIS — Z23 Encounter for immunization: Secondary | ICD-10-CM | POA: Diagnosis not present

## 2015-01-11 DIAGNOSIS — M19049 Primary osteoarthritis, unspecified hand: Secondary | ICD-10-CM | POA: Diagnosis not present

## 2015-01-11 DIAGNOSIS — E6609 Other obesity due to excess calories: Secondary | ICD-10-CM | POA: Diagnosis not present

## 2015-01-11 DIAGNOSIS — Z6835 Body mass index (BMI) 35.0-35.9, adult: Secondary | ICD-10-CM | POA: Diagnosis not present

## 2015-02-01 ENCOUNTER — Other Ambulatory Visit (HOSPITAL_COMMUNITY): Payer: Self-pay | Admitting: Family Medicine

## 2015-02-01 DIAGNOSIS — Z1231 Encounter for screening mammogram for malignant neoplasm of breast: Secondary | ICD-10-CM

## 2015-02-18 ENCOUNTER — Ambulatory Visit (HOSPITAL_COMMUNITY)
Admission: RE | Admit: 2015-02-18 | Discharge: 2015-02-18 | Disposition: A | Discharge status: Home / Self Care | Payer: Commercial Managed Care - HMO | Source: Ambulatory Visit | Attending: Family Medicine | Admitting: Family Medicine

## 2015-02-18 DIAGNOSIS — Z1231 Encounter for screening mammogram for malignant neoplasm of breast: Secondary | ICD-10-CM

## 2015-05-01 DIAGNOSIS — E1140 Type 2 diabetes mellitus with diabetic neuropathy, unspecified: Secondary | ICD-10-CM | POA: Diagnosis not present

## 2015-05-01 DIAGNOSIS — I10 Essential (primary) hypertension: Secondary | ICD-10-CM | POA: Diagnosis not present

## 2015-05-01 DIAGNOSIS — E782 Mixed hyperlipidemia: Secondary | ICD-10-CM | POA: Diagnosis not present

## 2015-05-01 DIAGNOSIS — E1165 Type 2 diabetes mellitus with hyperglycemia: Secondary | ICD-10-CM | POA: Diagnosis not present

## 2015-05-01 DIAGNOSIS — Z1389 Encounter for screening for other disorder: Secondary | ICD-10-CM | POA: Diagnosis not present

## 2015-05-01 DIAGNOSIS — Z6837 Body mass index (BMI) 37.0-37.9, adult: Secondary | ICD-10-CM | POA: Diagnosis not present

## 2015-06-03 DIAGNOSIS — Z6838 Body mass index (BMI) 38.0-38.9, adult: Secondary | ICD-10-CM | POA: Diagnosis not present

## 2015-06-03 DIAGNOSIS — Z0000 Encounter for general adult medical examination without abnormal findings: Secondary | ICD-10-CM | POA: Diagnosis not present

## 2015-07-08 ENCOUNTER — Telehealth: Payer: Self-pay

## 2015-07-08 NOTE — Telephone Encounter (Signed)
Pt called to set up colonoscopy. Please call [phone]

## 2015-07-08 NOTE — Telephone Encounter (Signed)
Called. Many rings and no answer. Pt has hx of tubular adenoma. Letter was sent in 2013 for her to schedule. Pt needs OV.

## 2015-07-16 NOTE — Telephone Encounter (Signed)
Pt has appt on 04/06

## 2015-07-24 DIAGNOSIS — H2513 Age-related nuclear cataract, bilateral: Secondary | ICD-10-CM | POA: Diagnosis not present

## 2015-07-24 DIAGNOSIS — H524 Presbyopia: Secondary | ICD-10-CM | POA: Diagnosis not present

## 2015-07-24 DIAGNOSIS — E119 Type 2 diabetes mellitus without complications: Secondary | ICD-10-CM | POA: Diagnosis not present

## 2015-07-24 DIAGNOSIS — H52203 Unspecified astigmatism, bilateral: Secondary | ICD-10-CM | POA: Diagnosis not present

## 2015-07-25 ENCOUNTER — Encounter: Payer: Self-pay | Admitting: Gastroenterology

## 2015-07-25 ENCOUNTER — Ambulatory Visit (INDEPENDENT_AMBULATORY_CARE_PROVIDER_SITE_OTHER): Payer: Commercial Managed Care - HMO | Admitting: Gastroenterology

## 2015-07-25 ENCOUNTER — Other Ambulatory Visit: Payer: Self-pay

## 2015-07-25 VITALS — BP 146/76 | HR 81 | Temp 98.5°F | Ht 64.0 in | Wt 226.0 lb

## 2015-07-25 DIAGNOSIS — Z860100 Personal history of colon polyps, unspecified: Secondary | ICD-10-CM | POA: Insufficient documentation

## 2015-07-25 DIAGNOSIS — Z800 Family history of malignant neoplasm of digestive organs: Secondary | ICD-10-CM

## 2015-07-25 MED ORDER — PEG 3350-KCL-NA BICARB-NACL 420 G PO SOLR: 4000.0000 mL | Freq: Once | ORAL | Status: DC

## 2015-07-25 NOTE — Progress Notes (Signed)
Primary Care Physician:  Glo Herring., MD Primary Gastroenterologist:  Dr. Oneida Alar   Chief Complaint  Patient presents with  . Colonoscopy    HPI:   [name] is a 64 y.o. female presenting today for surveillance colonoscopy. She has a history of adenomas in 2008, and her mother was diagnosed at AGE 17 WITH COLON CANCER and succumbed to the disease.   No constipation or diarrhea. No rectal bleeding. No abdominal pain. Quit smoking October 30, 2014 and gained about 30 lbs since then. Otherwise, she is doing well. Unfortunately, she tells me she has been under a lot of stress, as her daughter passed away last 04-25-2022 after complications from a PE at age of 32.   Past Medical History  Diagnosis Date  . Hypertension   . COPD (chronic obstructive pulmonary disease) ([place])   . Type II diabetes mellitus ([place])   . Arthritis     "hands, knees, feet"  . Pneumonia 08/2010; 11/2010  . SBO (small bowel obstruction) ([place]) 08/25/11    recurrent  . Asthma   . Peripheral edema   . Hyperlipidemia   . Colon polyps     Past Surgical History  Procedure Laterality Date  . Laparoscopic incisional / umbilical / ventral hernia repair  ? til 2002    "~ 13 times"  . Cholecystectomy  1979  . Tubal ligation  1978  . Hernia repair    . Knee cartilage surgery  07/2003    right/E-chart  . Appendectomy    . Esophageal dilation      remote past by Dr. Tamala Julian  . Small intestine surgery      Feb 2015 at [facility] and then remote past   . Colonoscopy  2008    Dr. Oneida Alar: 7mm sessile cecal polyp and 78mm sessile transverse colon polyp, torturous sigmoid colon, fragments of tubular adenomas on path     Current Outpatient Prescriptions  Medication Sig Dispense Refill  . albuterol (PROVENTIL HFA;VENTOLIN HFA) 108 (90 BASE) MCG/ACT inhaler Inhale 2 puffs into the lungs every 4 (four) hours as needed. For shortness of breath    . budesonide-formoterol (SYMBICORT) 160-4.5 MCG/ACT inhaler Inhale 1 puff  into the lungs 2 (two) times daily.    . busPIRone (BUSPAR) 10 MG tablet     . citalopram (CELEXA) 40 MG tablet Take 40 mg by mouth daily.    . enalapril (VASOTEC) 20 MG tablet Take 20 mg by mouth daily.    Marland Kitchen gabapentin (NEURONTIN) 100 MG capsule Take 100 mg by mouth 2 (two) times daily.    Marland Kitchen glipiZIDE (GLUCOTROL) 10 MG tablet Take 10 mg by mouth as needed.    . loratadine (CLARITIN) 10 MG tablet Take 10 mg by mouth daily.    Marland Kitchen lovastatin (MEVACOR) 20 MG tablet Take 20 mg by mouth at bedtime.    . meloxicam (MOBIC) 15 MG tablet     . metFORMIN (GLUCOPHAGE) 500 MG tablet Take 1,000 mg by mouth 2 (two) times daily.    . polyethylene glycol-electrolytes (NULYTELY/GOLYTELY) 420 g solution Take 4,000 mLs by mouth once. 4000 mL 0   No current facility-administered medications for this visit.    Allergies as of 07/25/2015 - Review Complete 07/25/2015  Allergen Reaction Noted  . Penicillins Hives   . Vicodin [hydrocodone-acetaminophen]  10/01/2012    Family History  Problem Relation Age of Onset  . Colon cancer Mother 56    Social History   Social History  .  Marital Status: Married    Spouse Name: N/A  . Number of Children: N/A  . Years of Education: N/A   Occupational History  . Not on file.   Social History Main Topics  . Smoking status: Former Smoker -- 1.00 packs/day for 42 years    Types: Cigarettes  . Smokeless tobacco: Former Systems developer    Quit date: 10/30/2014  . Alcohol Use: No  . Drug Use: No  . Sexual Activity: Yes   Other Topics Concern  . Not on file   Social History Narrative    Review of Systems: Gen: Denies any fever, chills, fatigue, weight loss, lack of appetite.  CV: Denies chest pain, heart palpitations, peripheral edema, syncope.  Resp: Denies shortness of breath at rest or with exertion. Denies wheezing or cough.  GI: see HPI  GU : Denies urinary burning, urinary frequency, urinary hesitancy MS: Joint pain in hands and knees  Derm: Denies rash,  itching, dry skin Psych: Denies depression, anxiety out of the ordinary Heme: Denies bruising, bleeding, and enlarged lymph nodes.  Physical Exam: BP 146/76 mmHg  Pulse 81  Temp(Src) 98.5 F (36.9 C) (Oral)  Ht 5\' 4"  (1.626 m)  Wt 226 lb (102.513 kg)  BMI 38.77 kg/m2 General:   Alert and oriented. Pleasant and cooperative. Well-nourished and well-developed.  Head:  Normocephalic and atraumatic. Eyes:  Without icterus, sclera clear and conjunctiva pink.  Ears:  Normal auditory acuity. Nose:  No deformity, discharge,  or lesions. Mouth:  No deformity or lesions, oral mucosa pink.  Lungs:  Clear to auscultation bilaterally. No wheezes, rales, or rhonchi. No distress.  Heart:  S1, S2 present without murmurs appreciated.  Abdomen:  +BS, soft, non-tender and non-distended. No HSM noted. No guarding or rebound. No masses appreciated. At umbilicus, area of hypertrophic scar tissue, chronic.  Rectal:  Deferred  Msk:  Symmetrical without gross deformities. Normal posture. Extremities:  Without edema. Neurologic:  Alert and  oriented x4;  grossly normal neurologically. Skin:  Intact without significant lesions or rashes. Psych:  Alert and cooperative. Normal mood and affect.

## 2015-07-25 NOTE — Progress Notes (Signed)
CC'ED TO PCP 

## 2015-07-25 NOTE — Assessment & Plan Note (Signed)
Mother age 63. High risk surveillance now.

## 2015-07-25 NOTE — Patient Instructions (Signed)
We have scheduled you for a colonoscopy in the near future with Dr. Oneida Alar.    Do not take metformin the day of the procedure.

## 2015-07-25 NOTE — Assessment & Plan Note (Signed)
65 year old female with personal history of adenomas in 2008, overdue for high risk surveillance colonoscopy; her mother was diagnosed at age 36 with colon cancer and succumbed to the disease. No concerning upper or lower GI symptoms. Applauded on smoking cessation. As of note, she has lost 100 lbs over the past few years intentionally, although she is struggling with some weight gain since smoking cessation. Applauded on these efforts as well and encouraged to continue.   Proceed with colonoscopy with Dr. Oneida Alar in the near future. The risks, benefits, and alternatives have been discussed in detail with the patient. They state understanding and desire to proceed.  PROPOFOL due to polypharmacy

## 2015-07-30 DIAGNOSIS — M129 Arthropathy, unspecified: Secondary | ICD-10-CM | POA: Diagnosis not present

## 2015-07-30 DIAGNOSIS — Z6838 Body mass index (BMI) 38.0-38.9, adult: Secondary | ICD-10-CM | POA: Diagnosis not present

## 2015-07-30 DIAGNOSIS — Z1389 Encounter for screening for other disorder: Secondary | ICD-10-CM | POA: Diagnosis not present

## 2015-07-30 DIAGNOSIS — M25561 Pain in right knee: Secondary | ICD-10-CM | POA: Diagnosis not present

## 2015-08-05 ENCOUNTER — Telehealth: Payer: Self-pay

## 2015-08-05 NOTE — Telephone Encounter (Signed)
PA per [facility] is approved for TCS   PA # is [id number]

## 2015-08-08 ENCOUNTER — Ambulatory Visit (INDEPENDENT_AMBULATORY_CARE_PROVIDER_SITE_OTHER): Payer: Commercial Managed Care - HMO

## 2015-08-08 ENCOUNTER — Ambulatory Visit (INDEPENDENT_AMBULATORY_CARE_PROVIDER_SITE_OTHER): Payer: Commercial Managed Care - HMO | Admitting: Orthopedic Surgery

## 2015-08-08 VITALS — BP 136/73 | HR 83 | Ht 64.5 in | Wt 223.0 lb

## 2015-08-08 DIAGNOSIS — M25561 Pain in right knee: Secondary | ICD-10-CM

## 2015-08-08 DIAGNOSIS — M5441 Lumbago with sciatica, right side: Secondary | ICD-10-CM

## 2015-08-08 MED ORDER — HYDROCODONE-ACETAMINOPHEN 5-325 MG PO TABS: 1.0000 | ORAL_TABLET | Freq: Four times a day (QID) | ORAL | Status: DC | PRN

## 2015-08-08 NOTE — Patient Instructions (Signed)

## 2015-08-08 NOTE — Progress Notes (Signed)
Chief Complaint  Patient presents with  . Knee Pain    Right knee pain   HPI-Right knee pain  Time frame since February No trauma Symptoms are giving out tingling behind the leg radiating pain down the leg and then throbbing aching lateral knee pain over the right knee. Constant 7 out of 10 No previous tests Current medication includes meloxicam, Norco, tramadol. Patient has weightbearing pain as well.  Review of systems she reports joint pain limb pain stiff joints history of back pain with epidural tingling and burning pain in her legs all systems were reviewed no other symptoms were reported  ROS as noted  Past Medical History  Diagnosis Date  . Hypertension   . COPD (chronic obstructive pulmonary disease) ([place])   . Type II diabetes mellitus ([place])   . Arthritis     "hands, knees, feet"  . Pneumonia 08/2010; 11/2010  . SBO (small bowel obstruction) ([place]) 08/25/11    recurrent  . Asthma   . Peripheral edema   . Hyperlipidemia   . Colon polyps     Past Surgical History  Procedure Laterality Date  . Laparoscopic incisional / umbilical / ventral hernia repair  ? til 2002    "~ 13 times"  . Cholecystectomy  1979  . Tubal ligation  1978  . Hernia repair    . Knee cartilage surgery  07/2003    right/E-chart  . Appendectomy    . Esophageal dilation      remote past by Dr. Tamala Julian  . Small intestine surgery      Feb 2015 at [facility] and then remote past   . Colonoscopy  2008    Dr. Oneida Alar: 28mm sessile cecal polyp and 54mm sessile transverse colon polyp, torturous sigmoid colon, fragments of tubular adenomas on path    Family History  Problem Relation Age of Onset  . Colon cancer Mother 32   Social History  Substance Use Topics  . Smoking status: Former Smoker -- 1.00 packs/day for 42 years    Types: Cigarettes  . Smokeless tobacco: Former Systems developer    Quit date: 10/30/2014  . Alcohol Use: No    Current outpatient prescriptions:  .  albuterol (PROVENTIL HFA;VENTOLIN HFA)  108 (90 BASE) MCG/ACT inhaler, Inhale 2 puffs into the lungs every 4 (four) hours as needed. For shortness of breath, Disp: , Rfl:  .  busPIRone (BUSPAR) 10 MG tablet, Take 10 mg by mouth 2 (two) times daily. , Disp: , Rfl:  .  citalopram (CELEXA) 40 MG tablet, Take 40 mg by mouth daily., Disp: , Rfl:  .  enalapril (VASOTEC) 20 MG tablet, Take 20 mg by mouth daily., Disp: , Rfl:  .  gabapentin (NEURONTIN) 600 MG tablet, Take 600 mg by mouth 3 (three) times daily., Disp: , Rfl:  .  glipiZIDE (GLUCOTROL) 5 MG tablet, Take 5 mg by mouth 2 (two) times daily before a meal., Disp: , Rfl:  .  HYDROcodone-acetaminophen (NORCO/VICODIN) 5-325 MG tablet, Take 1 tablet by mouth every 6 (six) hours as needed for moderate pain., Disp: 30 tablet, Rfl: 0 .  lovastatin (MEVACOR) 40 MG tablet, Take 40 mg by mouth at bedtime., Disp: , Rfl:  .  meloxicam (MOBIC) 15 MG tablet, Take 15 mg by mouth daily. , Disp: , Rfl:  .  metFORMIN (GLUCOPHAGE) 1000 MG tablet, Take 1,000 mg by mouth 2 (two) times daily with a meal., Disp: , Rfl:  .  polyethylene glycol-electrolytes (NULYTELY/GOLYTELY) 420 g solution, Take 4,000 mLs  by mouth once., Disp: 4000 mL, Rfl: 0 .  traMADol (ULTRAM) 50 MG tablet, Take 50 mg by mouth every 8 (eight) hours as needed for moderate pain., Disp: , Rfl:   BP 136/73 mmHg  Pulse 83  Ht 5' 4.5" (1.638 m)  Wt 223 lb (101.152 kg)  BMI 37.70 kg/m2  Physical Exam  Constitutional: She is oriented to person, place, and time. She appears well-developed and well-nourished. No distress.  Cardiovascular: Normal rate and intact distal pulses.   Musculoskeletal:  The patient has obesity.  She has increased lumbar lordosis  She has a tender lumbar spine and tender left and right SI joints and para lumbar spinal musculature is without increased tension.  Neurological: She is alert and oriented to person, place, and time. She has normal reflexes. She exhibits normal muscle tone. Coordination normal.  Skin:  Skin is warm and dry. No rash noted. She is not diaphoretic. No erythema. No pallor.  Psychiatric: She has a normal mood and affect. Her behavior is normal. Judgment and thought content normal.    Ortho Exam  Left knee exhibits full range of motion. There is no instability. Strength is normal muscle tone is normal skin is normal, both dorsalis pedis pulses are normal.  Right knee lateral joint line tenderness full flexion-extension stable in all planes muscle tone is normal skin normal again pulses normal and sensation normal   ASSESSMENT: My personal interpretation of the images:  Lateral and medial joint space narrowing with lateral osteophytes other secondary bone changes include sclerosis no major cyst formation  Impression moderate arthritis left knee I reviewed the patient's radiographic history she's had a history of epidural injection she had an MRI in 2006 which show degenerative disc disease  Diagnosis lower back pain with radiation right leg    PLAN Recommend physical therapy for her back  Injection for her knee  Continue meloxicam and tramadol for breakthrough pain take hydrocodone. Follow-up 6 weeks. X-ray lower back.

## 2015-08-08 NOTE — Patient Instructions (Addendum)
[name]  08/08/2015     @PREFPERIOPPHARMACY @   Your procedure is scheduled on 08/13/15  Report to [facility] at [place] [facility]  Call this number if you have problems the morning of surgery:  [phone]   Remember:  Do not eat food or drink liquids after midnight.  Take these medicines the morning of surgery with A SIP OF WATER: buspar, celexa, vasotec, neurontin, norco, mobic. Use albuterol inhaler & bring it with you.   Do not wear jewelry, make-up or nail polish.  Do not wear lotions, powders, or perfumes.  You may wear deodorant.  Do not shave 48 hours prior to surgery.  Men may shave face and neck.  Do not bring valuables to the hospital.  [facility] is not responsible for any belongings or valuables.  Contacts, dentures or bridgework may not be worn into surgery.  Leave your suitcase in the car.  After surgery it may be brought to your room.  For patients admitted to the hospital, discharge time will be determined by your treatment team.  Patients discharged the day of surgery will not be allowed to drive home.   Name and phone number of your driver:   family Special instructions:  none  Please read over the following fact sheets that you were given. Anesthesia Post-op Instructions and Care and Recovery After Surgery        PATIENT INSTRUCTIONS POST-ANESTHESIA  IMMEDIATELY FOLLOWING SURGERY:  Do not drive or operate machinery for the first twenty four hours after surgery.  Do not make any important decisions for twenty four hours after surgery or while taking narcotic pain medications or sedatives.  If you develop intractable nausea and vomiting or a severe headache please notify your doctor immediately.  FOLLOW-UP:  Please make an appointment with your surgeon as instructed. You do not need to follow up with anesthesia unless specifically instructed to do so.  WOUND CARE INSTRUCTIONS (if applicable):  Keep a dry clean dressing on the anesthesia/puncture wound  site if there is drainage.  Once the wound has quit draining you may leave it open to air.  Generally you should leave the bandage intact for twenty four hours unless there is drainage.  If the epidural site drains for more than 36-48 hours please call the anesthesia department.  QUESTIONS?:  Please feel free to call your physician or the hospital operator if you have any questions, and they will be happy to assist you.      Colonoscopy A colonoscopy is an exam to look at the entire large intestine (colon). This exam can help find problems such as tumors, polyps, inflammation, and areas of bleeding. The exam takes about 1 hour.  LET [facility] CARE PROVIDER KNOW ABOUT:   Any allergies you have.  All medicines you are taking, including vitamins, herbs, eye drops, creams, and over-the-counter medicines.  Previous problems you or members of your family have had with the use of anesthetics.  Any blood disorders you have.  Previous surgeries you have had.  Medical conditions you have. RISKS AND COMPLICATIONS  Generally, this is a safe procedure. However, as with any procedure, complications can occur. Possible complications include:  Bleeding.  Tearing or rupture of the colon wall.  Reaction to medicines given during the exam.  Infection (rare). BEFORE THE PROCEDURE   Ask your health care provider about changing or stopping your regular medicines.  You may be prescribed an oral bowel prep. This involves drinking a large amount  of medicated liquid, starting the day before your procedure. The liquid will cause you to have multiple loose stools until your stool is almost clear or light green. This cleans out your colon in preparation for the procedure.  Do not eat or drink anything else once you have started the bowel prep, unless your health care provider tells you it is safe to do so.  Arrange for someone to drive you home after the procedure. PROCEDURE   You will be given  medicine to help you relax (sedative).  You will lie on your side with your knees bent.  A long, flexible tube with a light and camera on the end (colonoscope) will be inserted through the rectum and into the colon. The camera sends video back to a computer screen as it moves through the colon. The colonoscope also releases carbon dioxide gas to inflate the colon. This helps your health care provider see the area better.  During the exam, your health care provider may take a small tissue sample (biopsy) to be examined under a microscope if any abnormalities are found.  The exam is finished when the entire colon has been viewed. AFTER THE PROCEDURE   Do not drive for 24 hours after the exam.  You may have a small amount of blood in your stool.  You may pass moderate amounts of gas and have mild abdominal cramping or bloating. This is caused by the gas used to inflate your colon during the exam.  Ask when your test results will be ready and how you will get your results. Make sure you get your test results.   This information is not intended to replace advice given to you by your health care provider. Make sure you discuss any questions you have with your health care provider.   Document Released: 04/03/2000 Document Revised: 01/25/2013 Document Reviewed: 12/12/2012 Elsevier Interactive Patient Education [employer].

## 2015-08-12 ENCOUNTER — Encounter (HOSPITAL_COMMUNITY)
Admission: RE | Admit: 2015-08-12 | Discharge: 2015-08-12 | Disposition: A | Discharge status: Home / Self Care | Payer: Commercial Managed Care - HMO | Source: Ambulatory Visit | Attending: Gastroenterology | Admitting: Gastroenterology

## 2015-08-12 ENCOUNTER — Encounter (HOSPITAL_COMMUNITY): Payer: Self-pay

## 2015-08-12 DIAGNOSIS — D124 Benign neoplasm of descending colon: Secondary | ICD-10-CM | POA: Diagnosis not present

## 2015-08-12 DIAGNOSIS — M170 Bilateral primary osteoarthritis of knee: Secondary | ICD-10-CM | POA: Diagnosis not present

## 2015-08-12 DIAGNOSIS — K644 Residual hemorrhoidal skin tags: Secondary | ICD-10-CM | POA: Diagnosis not present

## 2015-08-12 DIAGNOSIS — E669 Obesity, unspecified: Secondary | ICD-10-CM | POA: Diagnosis not present

## 2015-08-12 DIAGNOSIS — I10 Essential (primary) hypertension: Secondary | ICD-10-CM | POA: Diagnosis not present

## 2015-08-12 DIAGNOSIS — D125 Benign neoplasm of sigmoid colon: Secondary | ICD-10-CM | POA: Diagnosis not present

## 2015-08-12 DIAGNOSIS — Z1211 Encounter for screening for malignant neoplasm of colon: Secondary | ICD-10-CM | POA: Diagnosis not present

## 2015-08-12 DIAGNOSIS — Z800 Family history of malignant neoplasm of digestive organs: Secondary | ICD-10-CM | POA: Diagnosis not present

## 2015-08-12 DIAGNOSIS — M19042 Primary osteoarthritis, left hand: Secondary | ICD-10-CM | POA: Diagnosis not present

## 2015-08-12 DIAGNOSIS — J45909 Unspecified asthma, uncomplicated: Secondary | ICD-10-CM | POA: Diagnosis not present

## 2015-08-12 DIAGNOSIS — D122 Benign neoplasm of ascending colon: Secondary | ICD-10-CM | POA: Diagnosis not present

## 2015-08-12 DIAGNOSIS — Z01810 Encounter for preprocedural cardiovascular examination: Secondary | ICD-10-CM | POA: Diagnosis not present

## 2015-08-12 DIAGNOSIS — D128 Benign neoplasm of rectum: Secondary | ICD-10-CM | POA: Diagnosis not present

## 2015-08-12 DIAGNOSIS — D123 Benign neoplasm of transverse colon: Secondary | ICD-10-CM | POA: Diagnosis not present

## 2015-08-12 DIAGNOSIS — J449 Chronic obstructive pulmonary disease, unspecified: Secondary | ICD-10-CM | POA: Diagnosis not present

## 2015-08-12 DIAGNOSIS — Z87891 Personal history of nicotine dependence: Secondary | ICD-10-CM | POA: Diagnosis not present

## 2015-08-12 DIAGNOSIS — K648 Other hemorrhoids: Secondary | ICD-10-CM | POA: Diagnosis not present

## 2015-08-12 DIAGNOSIS — K5730 Diverticulosis of large intestine without perforation or abscess without bleeding: Secondary | ICD-10-CM | POA: Diagnosis not present

## 2015-08-12 DIAGNOSIS — M19041 Primary osteoarthritis, right hand: Secondary | ICD-10-CM | POA: Diagnosis not present

## 2015-08-12 DIAGNOSIS — E119 Type 2 diabetes mellitus without complications: Secondary | ICD-10-CM | POA: Diagnosis not present

## 2015-08-12 DIAGNOSIS — Z6837 Body mass index (BMI) 37.0-37.9, adult: Secondary | ICD-10-CM | POA: Diagnosis not present

## 2015-08-12 DIAGNOSIS — E785 Hyperlipidemia, unspecified: Secondary | ICD-10-CM | POA: Diagnosis not present

## 2015-08-12 DIAGNOSIS — Z01812 Encounter for preprocedural laboratory examination: Secondary | ICD-10-CM | POA: Diagnosis not present

## 2015-08-12 LAB — BASIC METABOLIC PANEL
Anion gap: 9 (ref 5–15)
BUN: 15 mg/dL (ref 6–20)
CHLORIDE: 101 mmol/L (ref 101–111)
CO2: 26 mmol/L (ref 22–32)
CREATININE: 0.81 mg/dL (ref 0.44–1.00)
Calcium: 9.1 mg/dL (ref 8.9–10.3)
Glucose, Bld: 247 mg/dL — ABNORMAL HIGH (ref 65–99)
Potassium: 4.2 mmol/L (ref 3.5–5.1)
SODIUM: 136 mmol/L (ref 135–145)

## 2015-08-12 LAB — CBC WITH DIFFERENTIAL/PLATELET
BASOS PCT: 1 %
Basophils Absolute: 0 10*3/uL (ref 0.0–0.1)
EOS ABS: 0.1 10*3/uL (ref 0.0–0.7)
EOS PCT: 1 %
HCT: 38.4 % (ref 36.0–46.0)
HEMOGLOBIN: 12.8 g/dL (ref 12.0–15.0)
Lymphocytes Relative: 27 %
Lymphs Abs: 2.1 10*3/uL (ref 0.7–4.0)
MCH: 28.5 pg (ref 26.0–34.0)
MCHC: 33.3 g/dL (ref 30.0–36.0)
MCV: 85.5 fL (ref 78.0–100.0)
MONOS PCT: 4 %
Monocytes Absolute: 0.3 10*3/uL (ref 0.1–1.0)
NEUTROS PCT: 67 %
Neutro Abs: 5.3 10*3/uL (ref 1.7–7.7)
PLATELETS: 226 10*3/uL (ref 150–400)
RBC: 4.49 MIL/uL (ref 3.87–5.11)
RDW: 13.1 % (ref 11.5–15.5)
WBC: 7.8 10*3/uL (ref 4.0–10.5)

## 2015-08-12 NOTE — Pre-Procedure Instructions (Signed)
Patient given information to sign up for my chart at home. 

## 2015-08-13 ENCOUNTER — Ambulatory Visit (HOSPITAL_COMMUNITY): Payer: Commercial Managed Care - HMO | Admitting: Anesthesiology

## 2015-08-13 ENCOUNTER — Encounter (HOSPITAL_COMMUNITY)
Admission: RE | Disposition: A | Discharge status: Home / Self Care | Payer: Self-pay | Source: Ambulatory Visit | Attending: Gastroenterology

## 2015-08-13 ENCOUNTER — Ambulatory Visit (HOSPITAL_COMMUNITY)
Admission: RE | Admit: 2015-08-13 | Discharge: 2015-08-13 | Disposition: A | Discharge status: Home / Self Care | Payer: Commercial Managed Care - HMO | Source: Ambulatory Visit | Attending: Gastroenterology | Admitting: Gastroenterology

## 2015-08-13 DIAGNOSIS — Z800 Family history of malignant neoplasm of digestive organs: Secondary | ICD-10-CM

## 2015-08-13 DIAGNOSIS — E119 Type 2 diabetes mellitus without complications: Secondary | ICD-10-CM | POA: Insufficient documentation

## 2015-08-13 DIAGNOSIS — D123 Benign neoplasm of transverse colon: Secondary | ICD-10-CM | POA: Diagnosis not present

## 2015-08-13 DIAGNOSIS — K621 Rectal polyp: Secondary | ICD-10-CM | POA: Diagnosis not present

## 2015-08-13 DIAGNOSIS — Z01812 Encounter for preprocedural laboratory examination: Secondary | ICD-10-CM | POA: Insufficient documentation

## 2015-08-13 DIAGNOSIS — D125 Benign neoplasm of sigmoid colon: Secondary | ICD-10-CM | POA: Diagnosis not present

## 2015-08-13 DIAGNOSIS — D128 Benign neoplasm of rectum: Secondary | ICD-10-CM | POA: Diagnosis not present

## 2015-08-13 DIAGNOSIS — Z87891 Personal history of nicotine dependence: Secondary | ICD-10-CM | POA: Insufficient documentation

## 2015-08-13 DIAGNOSIS — Z01810 Encounter for preprocedural cardiovascular examination: Secondary | ICD-10-CM | POA: Insufficient documentation

## 2015-08-13 DIAGNOSIS — D122 Benign neoplasm of ascending colon: Secondary | ICD-10-CM | POA: Insufficient documentation

## 2015-08-13 DIAGNOSIS — E669 Obesity, unspecified: Secondary | ICD-10-CM | POA: Insufficient documentation

## 2015-08-13 DIAGNOSIS — K644 Residual hemorrhoidal skin tags: Secondary | ICD-10-CM | POA: Insufficient documentation

## 2015-08-13 DIAGNOSIS — K5730 Diverticulosis of large intestine without perforation or abscess without bleeding: Secondary | ICD-10-CM | POA: Diagnosis not present

## 2015-08-13 DIAGNOSIS — E785 Hyperlipidemia, unspecified: Secondary | ICD-10-CM | POA: Insufficient documentation

## 2015-08-13 DIAGNOSIS — K648 Other hemorrhoids: Secondary | ICD-10-CM | POA: Insufficient documentation

## 2015-08-13 DIAGNOSIS — Z1211 Encounter for screening for malignant neoplasm of colon: Secondary | ICD-10-CM | POA: Diagnosis not present

## 2015-08-13 DIAGNOSIS — M19042 Primary osteoarthritis, left hand: Secondary | ICD-10-CM | POA: Insufficient documentation

## 2015-08-13 DIAGNOSIS — J45909 Unspecified asthma, uncomplicated: Secondary | ICD-10-CM | POA: Insufficient documentation

## 2015-08-13 DIAGNOSIS — D124 Benign neoplasm of descending colon: Secondary | ICD-10-CM | POA: Diagnosis not present

## 2015-08-13 DIAGNOSIS — M170 Bilateral primary osteoarthritis of knee: Secondary | ICD-10-CM | POA: Insufficient documentation

## 2015-08-13 DIAGNOSIS — I10 Essential (primary) hypertension: Secondary | ICD-10-CM | POA: Insufficient documentation

## 2015-08-13 DIAGNOSIS — J449 Chronic obstructive pulmonary disease, unspecified: Secondary | ICD-10-CM | POA: Insufficient documentation

## 2015-08-13 DIAGNOSIS — Z6837 Body mass index (BMI) 37.0-37.9, adult: Secondary | ICD-10-CM | POA: Insufficient documentation

## 2015-08-13 DIAGNOSIS — M19041 Primary osteoarthritis, right hand: Secondary | ICD-10-CM | POA: Insufficient documentation

## 2015-08-13 HISTORY — PX: COLONOSCOPY WITH PROPOFOL: SHX5780

## 2015-08-13 HISTORY — PX: POLYPECTOMY: SHX5525

## 2015-08-13 LAB — GLUCOSE, CAPILLARY
GLUCOSE-CAPILLARY: 195 mg/dL — AB (ref 65–99)
Glucose-Capillary: 186 mg/dL — ABNORMAL HIGH (ref 65–99)

## 2015-08-13 SURGERY — COLONOSCOPY WITH PROPOFOL
Anesthesia: Monitor Anesthesia Care

## 2015-08-13 MED ORDER — MIDAZOLAM HCL 2 MG/2ML IJ SOLN
1.0000 mg | INTRAMUSCULAR | Status: DC | PRN
  Administered 2015-08-13: 2 mg via INTRAVENOUS

## 2015-08-13 MED ORDER — LACTATED RINGERS IV SOLN
INTRAVENOUS | Status: DC
  Administered 2015-08-13: 07:00:00 via INTRAVENOUS

## 2015-08-13 MED ORDER — PROPOFOL 10 MG/ML IV BOLUS
INTRAVENOUS | Status: AC
  Filled 2015-08-13: qty 40

## 2015-08-13 MED ORDER — EPHEDRINE SULFATE 50 MG/ML IJ SOLN
INTRAMUSCULAR | Status: AC
  Filled 2015-08-13: qty 1

## 2015-08-13 MED ORDER — MIDAZOLAM HCL 2 MG/2ML IJ SOLN
INTRAMUSCULAR | Status: AC
  Filled 2015-08-13: qty 2

## 2015-08-13 MED ORDER — FENTANYL CITRATE (PF) 100 MCG/2ML IJ SOLN: 25.0000 ug | INTRAMUSCULAR | Status: DC | PRN

## 2015-08-13 MED ORDER — ONDANSETRON HCL 4 MG/2ML IJ SOLN: 4.0000 mg | Freq: Once | INTRAMUSCULAR | Status: DC | PRN

## 2015-08-13 MED ORDER — PROPOFOL 500 MG/50ML IV EMUL
INTRAVENOUS | Status: DC | PRN
  Administered 2015-08-13: 125 ug/kg/min via INTRAVENOUS
  Administered 2015-08-13 (×2): via INTRAVENOUS

## 2015-08-13 MED ORDER — MIDAZOLAM HCL 5 MG/5ML IJ SOLN
INTRAMUSCULAR | Status: DC | PRN
  Administered 2015-08-13: 2 mg via INTRAVENOUS

## 2015-08-13 MED ORDER — SODIUM CHLORIDE 0.9 % IJ SOLN
INTRAMUSCULAR | Status: AC
  Filled 2015-08-13: qty 10

## 2015-08-13 NOTE — Discharge Instructions (Signed)
You have moderate size internal hemorrhoids and diverticulosis IN YOUR LEFT COLON. YOU HAD EIGHT POLYPS REMOVED.   CONTINUE YOUR WEIGHT LOSS EFFORTS. LOSE TEN POUNDS. YOUR BODY MASS INDEX IS OVER 30 WHICH MEANS YOU ARE OBESE. OBESITY IS ASSOCIATED WITH AN INCREASE FOR ALL CANCERS, INCLUDING ESOPHAGEAL AND COLON CANCER.  FOLLOW A HIGH FIBER DIET. AVOID ITEMS THAT CAUSE BLOATING. See info below.  YOUR BIOPSY RESULTS WILL BE AVAILABLE IN MY CHART APR 27 AND MY OFFICE WILL CONTACT YOU IN 10-14 DAYS WITH YOUR RESULTS.   Next colonoscopy in 3-5 years.  Colonoscopy Care After Read the instructions outlined below and refer to this sheet in the next week. These discharge instructions provide you with general information on caring for yourself after you leave the hospital. While your treatment has been planned according to the most current medical practices available, unavoidable complications occasionally occur. If you have any problems or questions after discharge, call DR. Taiven Greenley, [phone].  ACTIVITY  You may resume your regular activity, but move at a slower pace for the next 24 hours.   Take frequent rest periods for the next 24 hours.   Walking will help get rid of the air and reduce the bloated feeling in your belly (abdomen).   No driving for 24 hours (because of the medicine (anesthesia) used during the test).   You may shower.   Do not sign any important legal documents or operate any machinery for 24 hours (because of the anesthesia used during the test).    NUTRITION  Drink plenty of fluids.   You may resume your normal diet as instructed by your doctor.   Begin with a light meal and progress to your normal diet. Heavy or fried foods are harder to digest and may make you feel sick to your stomach (nauseated).   Avoid alcoholic beverages for 24 hours or as instructed.    MEDICATIONS  You may resume your normal medications.   WHAT YOU CAN EXPECT TODAY  Some  feelings of bloating in the abdomen.   Passage of more gas than usual.   Spotting of blood in your stool or on the toilet paper  .  IF YOU HAD POLYPS REMOVED DURING THE COLONOSCOPY:  Eat a soft diet IF YOU HAVE NAUSEA, BLOATING, ABDOMINAL PAIN, OR VOMITING.    FINDING OUT THE RESULTS OF YOUR TEST Not all test results are available during your visit. DR. Oneida Alar WILL CALL YOU WITHIN 14 DAYS OF YOUR PROCEDUE WITH YOUR RESULTS. Do not assume everything is normal if you have not heard from DR. Enzio Buchler, CALL HER OFFICE AT [phone].  SEEK IMMEDIATE MEDICAL ATTENTION AND CALL THE OFFICE: [phone] IF:  You have more than a spotting of blood in your stool.   Your belly is swollen (abdominal distention).   You are nauseated or vomiting.   You have a temperature over 101F.   You have abdominal pain or discomfort that is severe or gets worse throughout the day.  High-Fiber Diet A high-fiber diet changes your normal diet to include more whole grains, legumes, fruits, and vegetables. Changes in the diet involve replacing refined carbohydrates with unrefined foods. The calorie level of the diet is essentially unchanged. The Dietary Reference Intake (recommended amount) for adult males is 38 grams per day. For adult females, it is 25 grams per day. Pregnant and lactating women should consume 28 grams of fiber per day. Fiber is the intact part of a plant that is not broken down during digestion.  Functional fiber is fiber that has been isolated from the plant to provide a beneficial effect in the body. PURPOSE  Increase stool bulk.   Ease and regulate bowel movements.   Lower cholesterol.  REDUCE RISK OF COLON CANCER  INDICATIONS THAT YOU NEED MORE FIBER  Constipation and hemorrhoids.   Uncomplicated diverticulosis (intestine condition) and irritable bowel syndrome.   Weight management.   As a protective measure against hardening of the arteries (atherosclerosis), diabetes, and  cancer.   GUIDELINES FOR INCREASING FIBER IN THE DIET  Start adding fiber to the diet slowly. A gradual increase of about 5 more grams (2 slices of whole-wheat bread, 2 servings of most fruits or vegetables, or 1 bowl of high-fiber cereal) per day is best. Too rapid an increase in fiber may result in constipation, flatulence, and bloating.   Drink enough water and fluids to keep your urine clear or pale yellow. Water, juice, or caffeine-free drinks are recommended. Not drinking enough fluid may cause constipation.   Eat a variety of high-fiber foods rather than one type of fiber.   Try to increase your intake of fiber through using high-fiber foods rather than fiber pills or supplements that contain small amounts of fiber.   The goal is to change the types of food eaten. Do not supplement your present diet with high-fiber foods, but replace foods in your present diet.   INCLUDE A VARIETY OF FIBER SOURCES  Replace refined and processed grains with whole grains, canned fruits with fresh fruits, and incorporate other fiber sources. White rice, white breads, and most bakery goods contain little or no fiber.   Brown whole-grain rice, buckwheat oats, and many fruits and vegetables are all good sources of fiber. These include: broccoli, Brussels sprouts, cabbage, cauliflower, beets, sweet potatoes, white potatoes (skin on), carrots, tomatoes, eggplant, squash, berries, fresh fruits, and dried fruits.   Cereals appear to be the richest source of fiber. Cereal fiber is found in whole grains and bran. Bran is the fiber-rich outer coat of cereal grain, which is largely removed in refining. In whole-grain cereals, the bran remains. In breakfast cereals, the largest amount of fiber is found in those with "bran" in their names. The fiber content is sometimes indicated on the label.   You may need to include additional fruits and vegetables each day.   In baking, for 1 cup white flour, you may use the  following substitutions:   1 cup whole-wheat flour minus 2 tablespoons.   1/2 cup white flour plus 1/2 cup whole-wheat flour.   Polyps, Colon  A polyp is extra tissue that grows inside your body. Colon polyps grow in the large intestine. The large intestine, also called the colon, is part of your digestive system. It is a long, hollow tube at the end of your digestive tract where your body makes and stores stool. Most polyps are not dangerous. They are benign. This means they are not cancerous. But over time, some types of polyps can turn into cancer. Polyps that are smaller than a pea are usually not harmful. But larger polyps could someday become or may already be cancerous. To be safe, doctors remove all polyps and test them.    PREVENTION There is not one sure way to prevent polyps. You might be able to lower your risk of getting them if you:  Eat more fruits and vegetables and less fatty food.   Do not smoke.   Avoid alcohol.   Exercise every day.  Lose weight if you are overweight.   Eating more calcium and folate can also lower your risk of getting polyps. Some foods that are rich in calcium are milk, cheese, and broccoli. Some foods that are rich in folate are chickpeas, kidney beans, and spinach.    Diverticulosis Diverticulosis is a common condition that develops when small pouches (diverticula) form in the wall of the colon. The risk of diverticulosis increases with age. It happens more often in people who eat a low-fiber diet. Most individuals with diverticulosis have no symptoms. Those individuals with symptoms usually experience belly (abdominal) pain, constipation, or loose stools (diarrhea).  HOME CARE INSTRUCTIONS  Increase the amount of fiber in your diet as directed by your caregiver or dietician. This may reduce symptoms of diverticulosis.   Drink at least 6 to 8 glasses of water each day to prevent constipation.   Try not to strain when you have a bowel  movement.   Avoiding nuts and seeds to prevent complications is NOT NECESSARY.    FOODS HAVING HIGH FIBER CONTENT INCLUDE:  Fruits. Apple, peach, pear, tangerine, raisins, prunes.   Vegetables. Brussels sprouts, asparagus, broccoli, cabbage, carrot, cauliflower, romaine lettuce, spinach, summer squash, tomato, winter squash, zucchini.   Starchy Vegetables. Baked beans, kidney beans, lima beans, split peas, lentils, potatoes (with skin).   Grains. Whole wheat bread, brown rice, bran flake cereal, plain oatmeal, white rice, shredded wheat, bran muffins.   SEEK IMMEDIATE MEDICAL CARE IF:  You develop increasing pain or severe bloating.   You have an oral temperature above 101F.   You develop vomiting or bowel movements that are bloody or black.   Hemorrhoids Hemorrhoids are dilated (enlarged) veins around the rectum. Sometimes clots will form in the veins. This makes them swollen and painful. These are called thrombosed hemorrhoids. Causes of hemorrhoids include:  Constipation.   Straining to have a bowel movement.   HEAVY LIFTING  HOME CARE INSTRUCTIONS  Eat a well balanced diet and drink 6 to 8 glasses of water every day to avoid constipation. You may also use a bulk laxative.   Avoid straining to have bowel movements.   Keep anal area dry and clean.   Do not use a donut shaped pillow or sit on the toilet for long periods. This increases blood pooling and pain.   Move your bowels when your body has the urge; this will require less straining and will decrease pain and pressure.

## 2015-08-13 NOTE — Anesthesia Postprocedure Evaluation (Signed)
Anesthesia Post Note  Patient: [name]  Procedure(s) Performed: Procedure(s) (LRB): COLONOSCOPY WITH PROPOFOL (N/A) POLYPECTOMY  Patient location during evaluation: PACU Anesthesia Type: MAC Level of consciousness: awake, oriented and patient cooperative Pain management: pain level controlled Vital Signs Assessment: post-procedure vital signs reviewed and stable Respiratory status: spontaneous breathing, nonlabored ventilation and respiratory function stable Cardiovascular status: blood pressure returned to baseline Postop Assessment: no signs of nausea or vomiting Anesthetic complications: no    Last Vitals:  Filed Vitals:   08/13/15 0725 08/13/15 0730  BP:    Pulse:    Temp:    Resp: 18 20    Last Pain: There were no vitals filed for this visit.               Dean Goldner J

## 2015-08-13 NOTE — H&P (Signed)
Primary Care Physician:  Glo Herring., MD Primary Gastroenterologist:  Dr. Oneida Alar  Pre-Procedure History & Physical: HPI:  [name] is a 65 y.o. female here for [place].  Past Medical History  Diagnosis Date  . Hypertension   . COPD (chronic obstructive pulmonary disease) ([place])   . Type II diabetes mellitus ([place])   . Arthritis     "hands, knees, feet"  . Pneumonia 08/2010; 11/2010  . SBO (small bowel obstruction) ([place]) 08/25/11    recurrent  . Asthma   . Peripheral edema   . Hyperlipidemia   . Colon polyps     Past Surgical History  Procedure Laterality Date  . Laparoscopic incisional / umbilical / ventral hernia repair  ? til 2002    "~ 13 times"  . Cholecystectomy  1979  . Tubal ligation  1978  . Hernia repair    . Knee cartilage surgery  07/2003    right/E-chart  . Appendectomy    . Esophageal dilation      remote past by Dr. Tamala Julian  . Small intestine surgery      Feb 2015 at [facility] and then remote past   . Colonoscopy  2008    Dr. Oneida Alar: 43mm sessile cecal polyp and 65mm sessile transverse colon polyp, torturous sigmoid colon, fragments of tubular adenomas on path     Prior to Admission medications   Medication Sig Start Date End Date Taking? Authorizing Provider  albuterol (PROVENTIL HFA;VENTOLIN HFA) 108 (90 BASE) MCG/ACT inhaler Inhale 2 puffs into the lungs every 4 (four) hours as needed. For shortness of breath   Yes Historical Provider, MD  busPIRone (BUSPAR) 10 MG tablet Take 10 mg by mouth 2 (two) times daily.  05/31/15  Yes Historical Provider, MD  citalopram (CELEXA) 40 MG tablet Take 40 mg by mouth daily.   Yes Historical Provider, MD  enalapril (VASOTEC) 20 MG tablet Take 20 mg by mouth daily.   Yes Historical Provider, MD  gabapentin (NEURONTIN) 600 MG tablet Take 600 mg by mouth 3 (three) times daily.   Yes Historical Provider, MD  glipiZIDE (GLUCOTROL) 5 MG tablet Take 5 mg by mouth 2 (two) times daily before a meal.   Yes  Historical Provider, MD  HYDROcodone-acetaminophen (NORCO/VICODIN) 5-325 MG tablet Take 1 tablet by mouth every 6 (six) hours as needed for moderate pain. 08/08/15  Yes Carole Civil, MD  lovastatin (MEVACOR) 40 MG tablet Take 40 mg by mouth at bedtime.   Yes Historical Provider, MD  meloxicam (MOBIC) 15 MG tablet Take 15 mg by mouth daily.  05/31/15  Yes Historical Provider, MD  metFORMIN (GLUCOPHAGE) 1000 MG tablet Take 1,000 mg by mouth 2 (two) times daily with a meal.   Yes Historical Provider, MD  polyethylene glycol-electrolytes (NULYTELY/GOLYTELY) 420 g solution Take 4,000 mLs by mouth once. 07/25/15  Yes Orvil Feil, NP  traMADol (ULTRAM) 50 MG tablet Take 50 mg by mouth every 8 (eight) hours as needed for moderate pain.   Yes Historical Provider, MD    Allergies as of 07/25/2015 - Review Complete 07/25/2015  Allergen Reaction Noted  . Penicillins Hives   . Vicodin [hydrocodone-acetaminophen]  10/01/2012    Family History  Problem Relation Age of Onset  . Colon cancer Mother 66    Social History   Social History  . Marital Status: Married    Spouse Name: N/A  . Number of Children: N/A  . Years of Education: N/A   Occupational History  .  Not on file.   Social History Main Topics  . Smoking status: Former Smoker -- 1.00 packs/day for 42 years    Types: Cigarettes  . Smokeless tobacco: Former Systems developer    Quit date: 10/30/2014  . Alcohol Use: No  . Drug Use: No  . Sexual Activity: Yes   Other Topics Concern  . Not on file   Social History Narrative    Review of Systems: See HPI, otherwise negative ROS   Physical Exam: BP 164/79 mmHg  Pulse 68  Temp(Src) 98.1 F (36.7 C) (Oral)  Resp 18  Ht 5' 4.5" (1.638 m)  Wt 223 lb (101.152 kg)  BMI 37.70 kg/m2  SpO2 92% General:   Alert,  pleasant and cooperative in NAD Head:  Normocephalic and atraumatic. Neck:  Supple; Lungs:  Clear throughout to auscultation.    Heart:  Regular rate and rhythm. Abdomen:  Soft,  nontender and nondistended. Normal bowel sounds, without guarding, and without rebound.   Neurologic:  Alert and  oriented x4;  grossly normal neurologically.  Impression/Plan:    COLON CANCER SCREENING.  Plan: 1. TCS today

## 2015-08-13 NOTE — Progress Notes (Signed)
REVIEWED-NO ADDITIONAL RECOMMENDATIONS. 

## 2015-08-13 NOTE — Transfer of Care (Signed)
Immediate Anesthesia Transfer of Care Note  Patient: CHITRA IGOU  Procedure(s) Performed: Procedure(s) with comments: COLONOSCOPY WITH PROPOFOL (N/A) - 0815 POLYPECTOMY - ascending colon polyp, hepatic flexure polyp, transverse colon polyp, descending colon polyp, sigmoid colon polyp, rectal polyps times 3  Patient Location: PACU  Anesthesia Type:MAC  Level of Consciousness: awake and patient cooperative  Airway & Oxygen Therapy: Patient Spontanous Breathing and Patient connected to face mask oxygen  Post-op Assessment: Report given to RN, Post -op Vital signs reviewed and stable and Patient moving all extremities  Post vital signs: Reviewed and stable  Last Vitals:  Filed Vitals:   08/13/15 0725 08/13/15 0730  BP:    Pulse:    Temp:    Resp: 18 20    Complications: No apparent anesthesia complications

## 2015-08-13 NOTE — Anesthesia Preprocedure Evaluation (Signed)
Anesthesia Evaluation  Patient identified by MRN, date of birth, ID band Patient awake    Reviewed: Allergy & Precautions, NPO status , Patient's Chart, lab work & pertinent test results  Airway Mallampati: III  TM Distance: >3 FB     Dental  (+) Edentulous Upper, Edentulous Lower, Upper Dentures   Pulmonary asthma , pneumonia, resolved, COPD,  COPD inhaler, former smoker,    Pulmonary exam normal        Cardiovascular hypertension, Pt. on medications Normal cardiovascular exam     Neuro/Psych    GI/Hepatic   Endo/Other  diabetes, Well Controlled, Oral Hypoglycemic Agents  Renal/GU      Musculoskeletal  (+) Arthritis ,   Abdominal (+) + obese,   Peds  Hematology   Anesthesia Other Findings   Reproductive/Obstetrics                             Anesthesia Physical Anesthesia Plan  ASA: III  Anesthesia Plan: MAC   Post-op Pain Management:    Induction:   Airway Management Planned: Nasal Cannula  Additional Equipment:   Intra-op Plan:   Post-operative Plan:   Informed Consent: I have reviewed the patients History and Physical, chart, labs and discussed the procedure including the risks, benefits and alternatives for the proposed anesthesia with the patient or authorized representative who has indicated his/her understanding and acceptance.     Plan Discussed with: CRNA  Anesthesia Plan Comments:         Anesthesia Quick Evaluation

## 2015-08-13 NOTE — Op Note (Signed)
[facility] Patient Name: [name] Procedure Date: 08/13/2015 7:33 AM MRN: [id number] Date of Birth: November 13, 1950 Attending MD: Barney Drain , MD CSN: [id number] Age: 65 Admit Type: Outpatient Procedure:                Colonoscopy Indications:              Screening for colorectal malignant neoplasm,                            Screening in patient at increased risk: Colorectal                            cancer in mother before age 72 Providers:                Barney Drain, MD, Janeece Riggers, RN, Isabella Stalling,                            Technician Referring MD:              Medicines:                Propofol per Anesthesia Complications:            No immediate complications. Estimated Blood Loss:     Estimated blood loss was minimal. Procedure:                Pre-Anesthesia Assessment:                           - Prior to the procedure, a History and Physical                            was performed, and patient medications and                            allergies were reviewed. The patient's tolerance of                            previous anesthesia was also reviewed. The risks                            and benefits of the procedure and the sedation                            options and risks were discussed with the patient.                            All questions were answered, and informed consent                            was obtained. Prior Anticoagulants: The patient                            Mobic. ASA Grade Assessment: II - A patient with  mild systemic disease. After reviewing the risks                            and benefits, the patient was deemed in                            satisfactory condition to undergo the procedure.                           After obtaining informed consent, the colonoscope                            was passed under direct vision. Throughout the                            procedure, the patient's blood  pressure, pulse, and                            oxygen saturations were monitored continuously. The                            [id number] scope was introduced through                            the anus and advanced to the the cecum, identified                            by appendiceal orifice and ileocecal valve. The                            ileocecal valve, appendiceal orifice, and rectum                            were photographed. The colonoscopy was somewhat                            difficult due to a tortuous colon. Successful                            completion of the procedure was aided by changing                            the patient to a supine position and using manual                            pressure. The quality of the bowel preparation was                            excellent. The ileocecal valve, appendiceal                            orifice, and rectum were photographed. Scope In: 7:46:23 AM Scope Out: 8:23:43 AM Scope Withdrawal Time: 0 hours 31 minutes 58 seconds  Total  Procedure Duration: 0 hours 37 minutes 20 seconds  Findings:      The digital rectal exam findings include non-thrombosed external       hemorrhoids.      A 6 to 8 mm polyp was found in the transverse colon hepatic flexure. The       polyp was sessile. The polyp was removed with a hot snare. Resection and       retrieval were complete.      Six sessile polyps were found in the rectum, sigmoid colon, descending       colon and hepatic flexure. The polyps were 3 to 5 mm in size. These       polyps were removed with a cold biopsy forceps. Resection and retrieval       were complete.      A few small-mouthed diverticula were found in the sigmoid colon.      Non-bleeding external and internal hemorrhoids were found. Impression:               - Non-thrombosed external hemorrhoids found on                            digital rectal exam.                           - One 6 to 8 mm polyp in  the transverse colon at                            the hepatic flexure, removed with a hot snare.                            Resected and retrieved.                           - Six 3 to 5 mm polyps in the rectum, in the                            sigmoid colon, in the descending colon and at the                            hepatic flexure, removed with a cold biopsy                            forceps. Resected and retrieved.                           - Diverticulosis in the sigmoid colon.                           - Non-bleeding external and internal hemorrhoids. Moderate Sedation:      Per Anesthesia Care Recommendation:           - Patient has a contact number available for                            emergencies. The signs and symptoms of potential  delayed complications were discussed with the                            patient. Return to normal activities tomorrow.                            Written discharge instructions were provided to the                            patient.                           CONTINUE YOUR WEIGHT LOSS EFFORTS. LOSE TEN POUNDS.                           FOLLOW A HIGH FIBER DIET. AVOID ITEMS THAT CAUSE                            BLOATING.                           - High fiber diet.                           - Continue present medications.                           - Await pathology results.                           - Repeat colonoscopy in 3 - 5 years for                            surveillance. Procedure Code(s):        --- Professional ---                           [phone], Colonoscopy, flexible; with removal of                            tumor(s), polyp(s), or other lesion(s) by snare                            technique                           [id number], 36, Colonoscopy, flexible; with biopsy,                            single or multiple Diagnosis Code(s):        --- Professional ---                           Z12.11, Encounter  for screening for malignant                            neoplasm of colon  Z80.0, Family history of malignant neoplasm of                            digestive organs                           D12.3, Benign neoplasm of transverse colon (hepatic                            flexure or splenic flexure)                           K62.1, Rectal polyp                           D12.5, Benign neoplasm of sigmoid colon                           D12.4, Benign neoplasm of descending colon                           K64.4, Residual hemorrhoidal skin tags                           K64.8, Other hemorrhoids                           K57.30, Diverticulosis of large intestine without                            perforation or abscess without bleeding CPT copyright 2016 American Medical Association. All rights reserved. The codes documented in this report are preliminary and upon coder review may  be revised to meet current compliance requirements. Barney Drain, MD Barney Drain, MD 08/13/2015 8:22:23 PM This report has been signed electronically. Number of Addenda: 0

## 2015-08-15 ENCOUNTER — Ambulatory Visit (HOSPITAL_COMMUNITY): Payer: Commercial Managed Care - HMO | Attending: Orthopedic Surgery

## 2015-08-15 ENCOUNTER — Telehealth (HOSPITAL_COMMUNITY): Payer: Self-pay

## 2015-08-15 NOTE — Telephone Encounter (Signed)
Patient did not show/call for scheduled PT evaluation. I called the house number in system, and spoke with husband. He states she likely has forgotten and that he will let her know to call [facility] OP to reschedule this very important visit.   10:12 AM, 08/15/2015 Etta Grandchild, PT, DPT PRN Physical Therapist - [place] License # [id number] [phone] (mobile)

## 2015-08-16 ENCOUNTER — Telehealth (HOSPITAL_COMMUNITY): Payer: Self-pay

## 2015-08-16 ENCOUNTER — Encounter (HOSPITAL_COMMUNITY): Payer: Self-pay | Admitting: Gastroenterology

## 2015-08-16 NOTE — Telephone Encounter (Signed)
08/16/15 called to see about reschedulling appt. Since she was a no show on 4/27.  Due to $40 copay she said she wasn't going to do therapy

## 2015-08-30 ENCOUNTER — Telehealth: Payer: Self-pay | Admitting: Gastroenterology

## 2015-08-30 NOTE — Telephone Encounter (Signed)
Please call pt. She had simple adenomas removed from her colon.   CONTINUE YOUR WEIGHT LOSS EFFORTS. LOSE TEN POUNDS.   FOLLOW A HIGH FIBER DIET. AVOID ITEMS THAT CAUSE BLOATING. S  Next colonoscopy in 3 years BECAUSE YOU HAD > 3 SIMPLE ADENOMAS REMOVED.

## 2015-08-30 NOTE — Telephone Encounter (Signed)
Tried to call with no answer  

## 2015-09-02 NOTE — Telephone Encounter (Signed)
Noted  

## 2015-09-02 NOTE — Telephone Encounter (Signed)
  Called pt  And LMOM to call office back.  Mailed pt letter

## 2015-09-02 NOTE — Telephone Encounter (Signed)
Reminder in epic °

## 2015-09-04 ENCOUNTER — Ambulatory Visit: Payer: Commercial Managed Care - HMO | Admitting: Orthopedic Surgery

## 2015-09-04 DIAGNOSIS — R252 Cramp and spasm: Secondary | ICD-10-CM | POA: Diagnosis not present

## 2015-09-04 DIAGNOSIS — E039 Hypothyroidism, unspecified: Secondary | ICD-10-CM | POA: Diagnosis not present

## 2015-09-04 DIAGNOSIS — E063 Autoimmune thyroiditis: Secondary | ICD-10-CM | POA: Diagnosis not present

## 2015-09-04 DIAGNOSIS — I10 Essential (primary) hypertension: Secondary | ICD-10-CM | POA: Diagnosis not present

## 2015-09-04 DIAGNOSIS — E119 Type 2 diabetes mellitus without complications: Secondary | ICD-10-CM | POA: Diagnosis not present

## 2015-09-04 DIAGNOSIS — Z1389 Encounter for screening for other disorder: Secondary | ICD-10-CM | POA: Diagnosis not present

## 2015-09-04 DIAGNOSIS — Z6838 Body mass index (BMI) 38.0-38.9, adult: Secondary | ICD-10-CM | POA: Diagnosis not present

## 2015-09-05 NOTE — Telephone Encounter (Signed)
Pt is aware of results. 

## 2015-09-19 ENCOUNTER — Ambulatory Visit: Payer: Commercial Managed Care - HMO | Admitting: Orthopedic Surgery

## 2015-09-30 ENCOUNTER — Encounter: Payer: Self-pay | Admitting: Orthopedic Surgery

## 2015-09-30 ENCOUNTER — Ambulatory Visit (INDEPENDENT_AMBULATORY_CARE_PROVIDER_SITE_OTHER): Payer: Commercial Managed Care - HMO | Admitting: Orthopedic Surgery

## 2015-09-30 DIAGNOSIS — M5441 Lumbago with sciatica, right side: Secondary | ICD-10-CM

## 2015-09-30 NOTE — Progress Notes (Signed)
NO SHOW

## 2015-11-20 DIAGNOSIS — M1991 Primary osteoarthritis, unspecified site: Secondary | ICD-10-CM | POA: Diagnosis not present

## 2015-11-20 DIAGNOSIS — E782 Mixed hyperlipidemia: Secondary | ICD-10-CM | POA: Diagnosis not present

## 2015-11-20 DIAGNOSIS — E669 Obesity, unspecified: Secondary | ICD-10-CM | POA: Diagnosis not present

## 2015-11-20 DIAGNOSIS — I10 Essential (primary) hypertension: Secondary | ICD-10-CM | POA: Diagnosis not present

## 2015-11-20 DIAGNOSIS — Z6839 Body mass index (BMI) 39.0-39.9, adult: Secondary | ICD-10-CM | POA: Diagnosis not present

## 2015-11-20 DIAGNOSIS — Z1389 Encounter for screening for other disorder: Secondary | ICD-10-CM | POA: Diagnosis not present

## 2015-11-20 DIAGNOSIS — E1165 Type 2 diabetes mellitus with hyperglycemia: Secondary | ICD-10-CM | POA: Diagnosis not present

## 2015-11-20 DIAGNOSIS — Z713 Dietary counseling and surveillance: Secondary | ICD-10-CM | POA: Diagnosis not present

## 2015-12-06 DIAGNOSIS — E782 Mixed hyperlipidemia: Secondary | ICD-10-CM | POA: Diagnosis not present

## 2015-12-06 DIAGNOSIS — I10 Essential (primary) hypertension: Secondary | ICD-10-CM | POA: Diagnosis not present

## 2015-12-06 DIAGNOSIS — Z6839 Body mass index (BMI) 39.0-39.9, adult: Secondary | ICD-10-CM | POA: Diagnosis not present

## 2015-12-06 DIAGNOSIS — E1165 Type 2 diabetes mellitus with hyperglycemia: Secondary | ICD-10-CM | POA: Diagnosis not present

## 2015-12-06 DIAGNOSIS — E1140 Type 2 diabetes mellitus with diabetic neuropathy, unspecified: Secondary | ICD-10-CM | POA: Diagnosis not present

## 2015-12-06 DIAGNOSIS — Z1389 Encounter for screening for other disorder: Secondary | ICD-10-CM | POA: Diagnosis not present

## 2016-01-27 ENCOUNTER — Other Ambulatory Visit: Payer: Self-pay | Admitting: Pharmacist

## 2016-01-27 NOTE — Patient Outreach (Signed)
Outreach call to [name] regarding her request for follow up from the [facility]. Called to speak with patient. Phone rings, but patient does not answer and no voicemail picks up.  Harlow Asa, PharmD Clinical Pharmacist [facility] [employer] [phone]

## 2016-02-18 ENCOUNTER — Other Ambulatory Visit (HOSPITAL_COMMUNITY): Payer: Self-pay | Admitting: Family Medicine

## 2016-02-18 DIAGNOSIS — Z1231 Encounter for screening mammogram for malignant neoplasm of breast: Secondary | ICD-10-CM

## 2016-02-19 ENCOUNTER — Ambulatory Visit (HOSPITAL_COMMUNITY)
Admission: RE | Admit: 2016-02-19 | Discharge: 2016-02-19 | Disposition: A | Discharge status: Home / Self Care | Payer: Commercial Managed Care - HMO | Source: Ambulatory Visit | Attending: Family Medicine | Admitting: Family Medicine

## 2016-02-19 DIAGNOSIS — Z1231 Encounter for screening mammogram for malignant neoplasm of breast: Secondary | ICD-10-CM | POA: Diagnosis not present

## 2016-03-19 DIAGNOSIS — E1165 Type 2 diabetes mellitus with hyperglycemia: Secondary | ICD-10-CM | POA: Diagnosis not present

## 2016-03-19 DIAGNOSIS — I10 Essential (primary) hypertension: Secondary | ICD-10-CM | POA: Diagnosis not present

## 2016-03-19 DIAGNOSIS — E782 Mixed hyperlipidemia: Secondary | ICD-10-CM | POA: Diagnosis not present

## 2016-03-19 DIAGNOSIS — Z6841 Body Mass Index (BMI) 40.0 and over, adult: Secondary | ICD-10-CM | POA: Diagnosis not present

## 2016-03-19 DIAGNOSIS — Z23 Encounter for immunization: Secondary | ICD-10-CM | POA: Diagnosis not present

## 2016-05-04 DIAGNOSIS — R062 Wheezing: Secondary | ICD-10-CM | POA: Diagnosis not present

## 2016-05-04 DIAGNOSIS — J209 Acute bronchitis, unspecified: Secondary | ICD-10-CM | POA: Diagnosis not present

## 2016-05-04 DIAGNOSIS — I10 Essential (primary) hypertension: Secondary | ICD-10-CM | POA: Diagnosis not present

## 2016-05-04 DIAGNOSIS — J069 Acute upper respiratory infection, unspecified: Secondary | ICD-10-CM | POA: Diagnosis not present

## 2016-05-04 DIAGNOSIS — Z6838 Body mass index (BMI) 38.0-38.9, adult: Secondary | ICD-10-CM | POA: Diagnosis not present

## 2016-05-04 DIAGNOSIS — Z1389 Encounter for screening for other disorder: Secondary | ICD-10-CM | POA: Diagnosis not present

## 2016-05-04 DIAGNOSIS — R070 Pain in throat: Secondary | ICD-10-CM | POA: Diagnosis not present

## 2016-05-04 DIAGNOSIS — E1165 Type 2 diabetes mellitus with hyperglycemia: Secondary | ICD-10-CM | POA: Diagnosis not present

## 2016-05-04 DIAGNOSIS — E782 Mixed hyperlipidemia: Secondary | ICD-10-CM | POA: Diagnosis not present

## 2016-06-18 DIAGNOSIS — Z1389 Encounter for screening for other disorder: Secondary | ICD-10-CM | POA: Diagnosis not present

## 2016-06-18 DIAGNOSIS — E1165 Type 2 diabetes mellitus with hyperglycemia: Secondary | ICD-10-CM | POA: Diagnosis not present

## 2016-06-18 DIAGNOSIS — I10 Essential (primary) hypertension: Secondary | ICD-10-CM | POA: Diagnosis not present

## 2016-06-18 DIAGNOSIS — E782 Mixed hyperlipidemia: Secondary | ICD-10-CM | POA: Diagnosis not present

## 2016-06-18 DIAGNOSIS — Z6837 Body mass index (BMI) 37.0-37.9, adult: Secondary | ICD-10-CM | POA: Diagnosis not present

## 2016-10-01 DIAGNOSIS — Z1389 Encounter for screening for other disorder: Secondary | ICD-10-CM | POA: Diagnosis not present

## 2016-10-01 DIAGNOSIS — E6609 Other obesity due to excess calories: Secondary | ICD-10-CM | POA: Diagnosis not present

## 2016-10-01 DIAGNOSIS — Z6835 Body mass index (BMI) 35.0-35.9, adult: Secondary | ICD-10-CM | POA: Diagnosis not present

## 2016-10-01 DIAGNOSIS — I10 Essential (primary) hypertension: Secondary | ICD-10-CM | POA: Diagnosis not present

## 2016-10-01 DIAGNOSIS — E1140 Type 2 diabetes mellitus with diabetic neuropathy, unspecified: Secondary | ICD-10-CM | POA: Diagnosis not present

## 2016-10-01 DIAGNOSIS — E782 Mixed hyperlipidemia: Secondary | ICD-10-CM | POA: Diagnosis not present

## 2016-10-01 DIAGNOSIS — E1165 Type 2 diabetes mellitus with hyperglycemia: Secondary | ICD-10-CM | POA: Diagnosis not present

## 2017-01-07 DIAGNOSIS — E782 Mixed hyperlipidemia: Secondary | ICD-10-CM | POA: Diagnosis not present

## 2017-01-07 DIAGNOSIS — I10 Essential (primary) hypertension: Secondary | ICD-10-CM | POA: Diagnosis not present

## 2017-01-07 DIAGNOSIS — Z6833 Body mass index (BMI) 33.0-33.9, adult: Secondary | ICD-10-CM | POA: Diagnosis not present

## 2017-01-07 DIAGNOSIS — Z1389 Encounter for screening for other disorder: Secondary | ICD-10-CM | POA: Diagnosis not present

## 2017-01-07 DIAGNOSIS — E119 Type 2 diabetes mellitus without complications: Secondary | ICD-10-CM | POA: Diagnosis not present

## 2017-01-07 DIAGNOSIS — J441 Chronic obstructive pulmonary disease with (acute) exacerbation: Secondary | ICD-10-CM | POA: Diagnosis not present

## 2017-01-07 DIAGNOSIS — E6609 Other obesity due to excess calories: Secondary | ICD-10-CM | POA: Diagnosis not present

## 2017-01-07 DIAGNOSIS — J069 Acute upper respiratory infection, unspecified: Secondary | ICD-10-CM | POA: Diagnosis not present

## 2017-01-14 DIAGNOSIS — J069 Acute upper respiratory infection, unspecified: Secondary | ICD-10-CM | POA: Diagnosis not present

## 2017-03-04 DIAGNOSIS — Z6832 Body mass index (BMI) 32.0-32.9, adult: Secondary | ICD-10-CM | POA: Diagnosis not present

## 2017-03-04 DIAGNOSIS — E782 Mixed hyperlipidemia: Secondary | ICD-10-CM | POA: Diagnosis not present

## 2017-03-04 DIAGNOSIS — E119 Type 2 diabetes mellitus without complications: Secondary | ICD-10-CM | POA: Diagnosis not present

## 2017-03-04 DIAGNOSIS — F419 Anxiety disorder, unspecified: Secondary | ICD-10-CM | POA: Diagnosis not present

## 2017-03-04 DIAGNOSIS — J449 Chronic obstructive pulmonary disease, unspecified: Secondary | ICD-10-CM | POA: Diagnosis not present

## 2017-03-04 DIAGNOSIS — E6609 Other obesity due to excess calories: Secondary | ICD-10-CM | POA: Diagnosis not present

## 2017-03-04 DIAGNOSIS — I10 Essential (primary) hypertension: Secondary | ICD-10-CM | POA: Diagnosis not present

## 2017-03-04 DIAGNOSIS — Z0000 Encounter for general adult medical examination without abnormal findings: Secondary | ICD-10-CM | POA: Diagnosis not present

## 2017-03-05 DIAGNOSIS — E782 Mixed hyperlipidemia: Secondary | ICD-10-CM | POA: Diagnosis not present

## 2017-03-05 DIAGNOSIS — T819XXS Unspecified complication of procedure, sequela: Secondary | ICD-10-CM | POA: Diagnosis not present

## 2017-03-05 DIAGNOSIS — Z1389 Encounter for screening for other disorder: Secondary | ICD-10-CM | POA: Diagnosis not present

## 2017-03-05 DIAGNOSIS — Z01411 Encounter for gynecological examination (general) (routine) with abnormal findings: Secondary | ICD-10-CM | POA: Diagnosis not present

## 2017-05-07 DIAGNOSIS — E1140 Type 2 diabetes mellitus with diabetic neuropathy, unspecified: Secondary | ICD-10-CM | POA: Diagnosis not present

## 2017-05-07 DIAGNOSIS — Z6841 Body Mass Index (BMI) 40.0 and over, adult: Secondary | ICD-10-CM | POA: Diagnosis not present

## 2017-05-07 DIAGNOSIS — L0390 Cellulitis, unspecified: Secondary | ICD-10-CM | POA: Diagnosis not present

## 2017-05-07 DIAGNOSIS — Z23 Encounter for immunization: Secondary | ICD-10-CM | POA: Diagnosis not present

## 2017-05-20 DIAGNOSIS — E1140 Type 2 diabetes mellitus with diabetic neuropathy, unspecified: Secondary | ICD-10-CM | POA: Diagnosis not present

## 2017-05-20 DIAGNOSIS — J069 Acute upper respiratory infection, unspecified: Secondary | ICD-10-CM | POA: Diagnosis not present

## 2017-05-20 DIAGNOSIS — E6609 Other obesity due to excess calories: Secondary | ICD-10-CM | POA: Diagnosis not present

## 2017-05-20 DIAGNOSIS — Z6831 Body mass index (BMI) 31.0-31.9, adult: Secondary | ICD-10-CM | POA: Diagnosis not present

## 2017-05-20 DIAGNOSIS — I10 Essential (primary) hypertension: Secondary | ICD-10-CM | POA: Diagnosis not present

## 2017-05-20 DIAGNOSIS — B349 Viral infection, unspecified: Secondary | ICD-10-CM | POA: Diagnosis not present

## 2017-05-20 DIAGNOSIS — E782 Mixed hyperlipidemia: Secondary | ICD-10-CM | POA: Diagnosis not present

## 2017-05-20 DIAGNOSIS — E063 Autoimmune thyroiditis: Secondary | ICD-10-CM | POA: Diagnosis not present

## 2017-05-20 DIAGNOSIS — J449 Chronic obstructive pulmonary disease, unspecified: Secondary | ICD-10-CM | POA: Diagnosis not present

## 2017-05-26 DIAGNOSIS — Z4802 Encounter for removal of sutures: Secondary | ICD-10-CM | POA: Diagnosis not present

## 2017-05-26 DIAGNOSIS — K429 Umbilical hernia without obstruction or gangrene: Secondary | ICD-10-CM | POA: Diagnosis not present

## 2017-08-13 DIAGNOSIS — I10 Essential (primary) hypertension: Secondary | ICD-10-CM | POA: Diagnosis not present

## 2017-08-13 DIAGNOSIS — Z6832 Body mass index (BMI) 32.0-32.9, adult: Secondary | ICD-10-CM | POA: Diagnosis not present

## 2017-08-13 DIAGNOSIS — E1140 Type 2 diabetes mellitus with diabetic neuropathy, unspecified: Secondary | ICD-10-CM | POA: Diagnosis not present

## 2017-08-13 DIAGNOSIS — E063 Autoimmune thyroiditis: Secondary | ICD-10-CM | POA: Diagnosis not present

## 2017-08-13 DIAGNOSIS — E782 Mixed hyperlipidemia: Secondary | ICD-10-CM | POA: Diagnosis not present

## 2017-08-13 DIAGNOSIS — Z1389 Encounter for screening for other disorder: Secondary | ICD-10-CM | POA: Diagnosis not present

## 2017-08-13 DIAGNOSIS — E6609 Other obesity due to excess calories: Secondary | ICD-10-CM | POA: Diagnosis not present

## 2017-08-21 ENCOUNTER — Other Ambulatory Visit: Payer: Self-pay

## 2017-08-21 ENCOUNTER — Emergency Department (HOSPITAL_COMMUNITY): Payer: Medicare HMO

## 2017-08-21 ENCOUNTER — Encounter (HOSPITAL_COMMUNITY): Payer: Self-pay | Admitting: *Deleted

## 2017-08-21 ENCOUNTER — Inpatient Hospital Stay (HOSPITAL_COMMUNITY)
Admission: EM | Admit: 2017-08-21 | Discharge: 2017-08-23 | DRG: 390 | Disposition: A | Discharge status: Home / Self Care | Payer: Medicare HMO | Attending: Internal Medicine | Admitting: Internal Medicine

## 2017-08-21 DIAGNOSIS — E1169 Type 2 diabetes mellitus with other specified complication: Secondary | ICD-10-CM | POA: Diagnosis not present

## 2017-08-21 DIAGNOSIS — J449 Chronic obstructive pulmonary disease, unspecified: Secondary | ICD-10-CM | POA: Diagnosis not present

## 2017-08-21 DIAGNOSIS — Z885 Allergy status to narcotic agent status: Secondary | ICD-10-CM | POA: Diagnosis not present

## 2017-08-21 DIAGNOSIS — K56699 Other intestinal obstruction unspecified as to partial versus complete obstruction: Secondary | ICD-10-CM | POA: Diagnosis not present

## 2017-08-21 DIAGNOSIS — Z79899 Other long term (current) drug therapy: Secondary | ICD-10-CM | POA: Diagnosis not present

## 2017-08-21 DIAGNOSIS — R109 Unspecified abdominal pain: Secondary | ICD-10-CM | POA: Diagnosis not present

## 2017-08-21 DIAGNOSIS — K565 Intestinal adhesions [bands], unspecified as to partial versus complete obstruction: Principal | ICD-10-CM | POA: Diagnosis present

## 2017-08-21 DIAGNOSIS — Z7984 Long term (current) use of oral hypoglycemic drugs: Secondary | ICD-10-CM | POA: Diagnosis not present

## 2017-08-21 DIAGNOSIS — I10 Essential (primary) hypertension: Secondary | ICD-10-CM | POA: Diagnosis not present

## 2017-08-21 DIAGNOSIS — Z825 Family history of asthma and other chronic lower respiratory diseases: Secondary | ICD-10-CM | POA: Diagnosis not present

## 2017-08-21 DIAGNOSIS — R1110 Vomiting, unspecified: Secondary | ICD-10-CM | POA: Diagnosis not present

## 2017-08-21 DIAGNOSIS — E785 Hyperlipidemia, unspecified: Secondary | ICD-10-CM | POA: Diagnosis not present

## 2017-08-21 DIAGNOSIS — Z87891 Personal history of nicotine dependence: Secondary | ICD-10-CM

## 2017-08-21 DIAGNOSIS — K56609 Unspecified intestinal obstruction, unspecified as to partial versus complete obstruction: Secondary | ICD-10-CM

## 2017-08-21 DIAGNOSIS — Z8249 Family history of ischemic heart disease and other diseases of the circulatory system: Secondary | ICD-10-CM

## 2017-08-21 DIAGNOSIS — R1084 Generalized abdominal pain: Secondary | ICD-10-CM | POA: Diagnosis not present

## 2017-08-21 DIAGNOSIS — Z880 Allergy status to penicillin: Secondary | ICD-10-CM | POA: Diagnosis not present

## 2017-08-21 DIAGNOSIS — E119 Type 2 diabetes mellitus without complications: Secondary | ICD-10-CM | POA: Diagnosis present

## 2017-08-21 DIAGNOSIS — Z800 Family history of malignant neoplasm of digestive organs: Secondary | ICD-10-CM | POA: Diagnosis not present

## 2017-08-21 DIAGNOSIS — Z86010 Personal history of colonic polyps: Secondary | ICD-10-CM | POA: Diagnosis not present

## 2017-08-21 LAB — CBC WITH DIFFERENTIAL/PLATELET
BASOS ABS: 0 10*3/uL (ref 0.0–0.1)
BASOS PCT: 0 %
EOS ABS: 0 10*3/uL (ref 0.0–0.7)
Eosinophils Relative: 0 %
HEMATOCRIT: 45.7 % (ref 36.0–46.0)
HEMOGLOBIN: 15 g/dL (ref 12.0–15.0)
Lymphocytes Relative: 8 %
Lymphs Abs: 1.2 10*3/uL (ref 0.7–4.0)
MCH: 30.1 pg (ref 26.0–34.0)
MCHC: 32.8 g/dL (ref 30.0–36.0)
MCV: 91.6 fL (ref 78.0–100.0)
MONOS PCT: 7 %
Monocytes Absolute: 1 10*3/uL (ref 0.1–1.0)
NEUTROS ABS: 13.8 10*3/uL — AB (ref 1.7–7.7)
Neutrophils Relative %: 85 %
Platelets: 254 10*3/uL (ref 150–400)
RBC: 4.99 MIL/uL (ref 3.87–5.11)
RDW: 12.9 % (ref 11.5–15.5)
WBC: 16.1 10*3/uL — AB (ref 4.0–10.5)

## 2017-08-21 LAB — COMPREHENSIVE METABOLIC PANEL
ALBUMIN: 3.6 g/dL (ref 3.5–5.0)
ALK PHOS: 66 U/L (ref 38–126)
ALT: 11 U/L — AB (ref 14–54)
AST: 12 U/L — AB (ref 15–41)
Anion gap: 14 (ref 5–15)
BILIRUBIN TOTAL: 1.2 mg/dL (ref 0.3–1.2)
BUN: 28 mg/dL — ABNORMAL HIGH (ref 6–20)
CALCIUM: 9.2 mg/dL (ref 8.9–10.3)
CO2: 27 mmol/L (ref 22–32)
CREATININE: 0.92 mg/dL (ref 0.44–1.00)
Chloride: 95 mmol/L — ABNORMAL LOW (ref 101–111)
GFR calc Af Amer: >60 mL/min (ref >60)
GFR calc non Af Amer: >60 mL/min (ref >60)
GLUCOSE: 216 mg/dL — AB (ref 65–99)
Potassium: 3.3 mmol/L — ABNORMAL LOW (ref 3.5–5.1)
SODIUM: 136 mmol/L (ref 135–145)
TOTAL PROTEIN: 6.6 g/dL (ref 6.5–8.1)

## 2017-08-21 LAB — I-STAT CHEM 8, ED
BUN: 25 mg/dL — ABNORMAL HIGH (ref 6–20)
CALCIUM ION: 1.09 mmol/L — AB (ref 1.15–1.40)
CREATININE: 0.8 mg/dL (ref 0.44–1.00)
Chloride: 96 mmol/L — ABNORMAL LOW (ref 101–111)
GLUCOSE: 211 mg/dL — AB (ref 65–99)
HCT: 46 % (ref 36.0–46.0)
HEMOGLOBIN: 15.6 g/dL — AB (ref 12.0–15.0)
POTASSIUM: 3.3 mmol/L — AB (ref 3.5–5.1)
Sodium: 137 mmol/L (ref 135–145)
TCO2: 28 mmol/L (ref 22–32)

## 2017-08-21 MED ORDER — SODIUM CHLORIDE 0.9 % IV BOLUS
1000.0000 mL | Freq: Once | INTRAVENOUS | Status: AC
  Administered 2017-08-21: 1000 mL via INTRAVENOUS

## 2017-08-21 MED ORDER — HYDROMORPHONE HCL 1 MG/ML IJ SOLN
1.0000 mg | Freq: Once | INTRAMUSCULAR | Status: AC
  Administered 2017-08-21: 1 mg via INTRAVENOUS
  Filled 2017-08-21: qty 1

## 2017-08-21 MED ORDER — ONDANSETRON HCL 4 MG/2ML IJ SOLN
4.0000 mg | Freq: Four times a day (QID) | INTRAMUSCULAR | Status: DC | PRN
  Administered 2017-08-23 (×2): 4 mg via INTRAVENOUS
  Filled 2017-08-21 (×2): qty 2

## 2017-08-21 MED ORDER — INSULIN ASPART 100 UNIT/ML ~~LOC~~ SOLN
0.0000 [IU] | Freq: Three times a day (TID) | SUBCUTANEOUS | Status: DC
  Administered 2017-08-22: 2 [IU] via SUBCUTANEOUS
  Administered 2017-08-22 – 2017-08-23 (×3): 1 [IU] via SUBCUTANEOUS

## 2017-08-21 MED ORDER — POTASSIUM CHLORIDE 10 MEQ/100ML IV SOLN
10.0000 meq | Freq: Once | INTRAVENOUS | Status: AC
  Administered 2017-08-22: 10 meq via INTRAVENOUS
  Filled 2017-08-21: qty 100

## 2017-08-21 MED ORDER — SODIUM CHLORIDE 0.9 % IV SOLN
INTRAVENOUS | Status: DC
  Administered 2017-08-22 – 2017-08-23 (×3): via INTRAVENOUS

## 2017-08-21 MED ORDER — ONDANSETRON HCL 4 MG/2ML IJ SOLN
4.0000 mg | Freq: Once | INTRAMUSCULAR | Status: AC
  Administered 2017-08-21: 4 mg via INTRAVENOUS
  Filled 2017-08-21: qty 2

## 2017-08-21 MED ORDER — IOPAMIDOL (ISOVUE-300) INJECTION 61%
100.0000 mL | Freq: Once | INTRAVENOUS | Status: AC | PRN
  Administered 2017-08-21: 100 mL via INTRAVENOUS

## 2017-08-21 MED ORDER — HYDROMORPHONE HCL 1 MG/ML IJ SOLN
1.0000 mg | INTRAMUSCULAR | Status: DC | PRN
Start: 2017-08-21 — End: 2017-08-23
  Administered 2017-08-22 – 2017-08-23 (×9): 1 mg via INTRAVENOUS
  Filled 2017-08-21 (×9): qty 1

## 2017-08-21 MED ORDER — FAMOTIDINE IN NACL 20-0.9 MG/50ML-% IV SOLN
20.0000 mg | Freq: Once | INTRAVENOUS | Status: AC
  Administered 2017-08-21: 20 mg via INTRAVENOUS
  Filled 2017-08-21: qty 50

## 2017-08-21 MED ORDER — ENOXAPARIN SODIUM 40 MG/0.4ML ~~LOC~~ SOLN
40.0000 mg | SUBCUTANEOUS | Status: DC
  Administered 2017-08-22 – 2017-08-23 (×2): 40 mg via SUBCUTANEOUS
  Filled 2017-08-21 (×2): qty 0.4

## 2017-08-21 MED ORDER — ONDANSETRON HCL 4 MG PO TABS: 4.0000 mg | ORAL_TABLET | Freq: Four times a day (QID) | ORAL | Status: DC | PRN

## 2017-08-21 MED ORDER — HYDRALAZINE HCL 20 MG/ML IJ SOLN: 10.0000 mg | Freq: Four times a day (QID) | INTRAMUSCULAR | Status: DC | PRN

## 2017-08-21 NOTE — ED Provider Notes (Signed)
[facility] EMERGENCY DEPARTMENT Provider Note   CSN: [id number] Arrival date & time: 08/21/17  1629     History   Chief Complaint Chief Complaint  Patient presents with  . Abdominal Pain  . Emesis    HPI [name] is a 67 y.o. female.  Patient complains of vomiting and abdominal pain for 3 days.  Patient has had a bowel obstruction before causing similar symptoms.  She has had appendectomy and cholecystectomy  The history is provided by the patient. No language interpreter was used.  Abdominal Pain   This is a new problem. The current episode started more than 2 days ago. The problem occurs constantly. The problem has not changed since onset.The pain is associated with an unknown factor. The pain is located in the generalized abdominal region. The quality of the pain is aching. The pain is at a severity of 6/10. The pain is moderate. Associated symptoms include vomiting. Pertinent negatives include anorexia, diarrhea, frequency, hematuria and headaches. Nothing aggravates the symptoms. Nothing relieves the symptoms. Past workup does not include GI consult.    Past Medical History:  Diagnosis Date  . Arthritis    "hands, knees, feet"  . Asthma   . Colon polyps   . COPD (chronic obstructive pulmonary disease) ([place])   . Hyperlipidemia   . Hypertension   . Peripheral edema   . Pneumonia 08/2010; 11/2010  . SBO (small bowel obstruction) ([place]) 08/25/11   recurrent  . Type II diabetes mellitus [facility])     Patient Active Problem List   Diagnosis Date Noted  . History of colonic polyps 07/25/2015  . FH: colon cancer 07/25/2015  . Incarcerated ventral hernia 04/27/2013  . SBO (small bowel obstruction) ([place]) 08/25/2011  . Hypoxia 08/25/2011  . Leucocytosis 08/25/2011  . HTN (hypertension) 08/25/2011  . COPD (chronic obstructive pulmonary disease) ([place]) 08/25/2011  . TRIGGER FINGER 06/11/2009  . KNEE, ARTHRITIS, DEGEN./OSTEO 09/08/2007  . TIBIALIS TENDINITIS 02/23/2007  .  DIABETES 12/07/2006    Past Surgical History:  Procedure Laterality Date  . APPENDECTOMY    . CHOLECYSTECTOMY  1979  . COLONOSCOPY  2008   Dr. Oneida Alar: 66mm sessile cecal polyp and 89mm sessile transverse colon polyp, torturous sigmoid colon, fragments of tubular adenomas on path   . COLONOSCOPY WITH PROPOFOL N/A 08/13/2015   Procedure: COLONOSCOPY WITH PROPOFOL;  Surgeon: Danie Binder, MD;  Location: [facility] END[facility];  Service: Endoscopy;  Laterality: N/A;  3244  . ESOPHAGEAL DILATION     remote past by Dr. Tamala Julian  . HERNIA REPAIR    . KNEE CARTILAGE SURGERY  07/2003   right/E-chart  . LAPAROSCOPIC INCISIONAL / UMBILICAL / VENTRAL HERNIA REPAIR  ? til 2002   "~ 13 times"  . POLYPECTOMY  08/13/2015   Procedure: POLYPECTOMY;  Surgeon: Danie Binder, MD;  Location: [facility] ENDO SUITE;  Service: Endoscopy;;  ascending colon polyp, hepatic flexure polyp, transverse colon polyp, descending colon polyp, sigmoid colon polyp, rectal polyps times 3  . SMALL INTESTINE SURGERY     Feb 2015 at [facility] and then remote past   . [place]     OB History   None      Home Medications    Prior to Admission medications   Medication Sig Start Date End Date Taking? Authorizing Provider  busPIRone (BUSPAR) 10 MG tablet Take 10 mg by mouth 2 (two) times daily.  05/31/15  Yes [provider]  citalopram (CELEXA) 40 MG tablet Take  40 mg by mouth daily.   Yes [provider]  enalapril (VASOTEC) 20 MG tablet Take 20 mg by mouth daily.   Yes [provider]  gabapentin (NEURONTIN) 300 MG capsule Take 300 mg by mouth 3 (three) times daily.  02/03/14  Yes [provider]  lovastatin (MEVACOR) 40 MG tablet Take 40 mg by mouth at bedtime.   Yes [provider]  meloxicam (MOBIC) 15 MG tablet Take 15 mg by mouth daily.  05/31/15  Yes [provider]  sitaGLIPtin-metformin (JANUMET) 50-500 MG tablet Take by mouth.   Yes [provider]  tiZANidine  (ZANAFLEX) 4 MG tablet Take by mouth.   Yes [provider]  traMADol (ULTRAM) 50 MG tablet Take 50 mg by mouth every 8 (eight) hours as needed for moderate pain.   Yes [provider]  albuterol (PROVENTIL HFA;VENTOLIN HFA) 108 (90 BASE) MCG/ACT inhaler Inhale 2 puffs into the lungs every 4 (four) hours as needed. For shortness of breath    [provider]  doxycycline (VIBRA-TABS) 100 MG tablet Take by mouth.    [provider]  HYDROcodone-acetaminophen (NORCO/VICODIN) 5-325 MG tablet Take 1 tablet by mouth every 6 (six) hours as needed for moderate pain. 08/08/15   Carole Civil, MD    Family History Family History  Problem Relation Age of Onset  . Colon cancer Mother 61    Social History Social History   Tobacco Use  . Smoking status: Former Smoker    Packs/day: 1.00    Years: 42.00    Pack years: 42.00    Types: Cigarettes  . Smokeless tobacco: Former Systems developer    Quit date: 10/30/2014  Substance Use Topics  . Alcohol use: No    Alcohol/week: 0.0 oz  . Drug use: No     Allergies   Hydrocodone-acetaminophen; Penicillins; and Vicodin [hydrocodone-acetaminophen]   Review of Systems Review of Systems  Constitutional: Negative for appetite change and fatigue.  HENT: Negative for congestion, ear discharge and sinus pressure.   Eyes: Negative for discharge.  Respiratory: Negative for cough.   Cardiovascular: Negative for chest pain.  Gastrointestinal: Positive for abdominal pain and vomiting. Negative for anorexia and diarrhea.  Genitourinary: Negative for frequency and hematuria.  Musculoskeletal: Negative for back pain.  Skin: Negative for rash.  Neurological: Negative for seizures and headaches.  Psychiatric/Behavioral: Negative for hallucinations.     Physical Exam Updated Vital Signs BP 127/78   Pulse 91   Temp 98.6 F (37 C) (Oral)   Resp 16   Ht 5\' 4"  (1.626 m)   Wt 83.2 kg (183 lb 6 oz)   SpO2 92%   BMI 31.48 kg/m    Physical Exam  Constitutional: She is oriented to person, place, and time. She appears well-developed.  HENT:  Head: Normocephalic.  Eyes: Conjunctivae and EOM are normal. No scleral icterus.  Neck: Neck supple. No thyromegaly present.  Cardiovascular: Normal rate and regular rhythm. Exam reveals no gallop and no friction rub.  No murmur heard. Pulmonary/Chest: No stridor. She has no wheezes. She has no rales. She exhibits no tenderness.  Abdominal: She exhibits no distension. There is tenderness. There is no rebound.  Musculoskeletal: Normal range of motion. She exhibits no edema.  Lymphadenopathy:    She has no cervical adenopathy.  Neurological: She is oriented to person, place, and time. She exhibits normal muscle tone. Coordination normal.  Skin: No rash noted. No erythema.  Psychiatric: She has a normal mood and affect.  Her behavior is normal.     ED Treatments / Results  Labs (all labs ordered are listed, but only abnormal results are displayed) Labs Reviewed  CBC WITH DIFFERENTIAL/PLATELET - Abnormal; Notable for the following components:      Result Value   WBC 16.1 (*)    Neutro Abs 13.8 (*)    All other components within normal limits  COMPREHENSIVE METABOLIC PANEL - Abnormal; Notable for the following components:   Potassium 3.3 (*)    Chloride 95 (*)    Glucose, Bld 216 (*)    BUN 28 (*)    AST 12 (*)    ALT 11 (*)    All other components within normal limits  I-STAT CHEM 8, ED - Abnormal; Notable for the following components:   Potassium 3.3 (*)    Chloride 96 (*)    BUN 25 (*)    Glucose, Bld 211 (*)    Calcium, Ion 1.09 (*)    Hemoglobin 15.6 (*)    All other components within normal limits    EKG None  Radiology Ct Abdomen Pelvis W Contrast  Result Date: 08/21/2017 CLINICAL DATA:  Acute epigastric abdominal pain. EXAM: CT ABDOMEN AND PELVIS WITH CONTRAST TECHNIQUE: Multidetector CT imaging of the abdomen and pelvis was performed using the  standard protocol following bolus administration of intravenous contrast. CONTRAST:  136mL ISOVUE-300 IOPAMIDOL (ISOVUE-300) INJECTION 61% COMPARISON:  CT scan of January 04, 2011. FINDINGS: Lower chest: No acute abnormality. Hepatobiliary: Status post cholecystectomy. Mild intrahepatic biliary dilatation is noted which may be due to post cholecystectomy status. 13 mm low density is noted in right hepatic lobe peripherally which demonstrates Hounsfield measurement of 40. Pancreas: Unremarkable. No pancreatic ductal dilatation or surrounding inflammatory changes. Spleen: Normal in size without focal abnormality. Adrenals/Urinary Tract: Adrenal glands appear normal. No hydronephrosis or renal obstruction is noted. No renal or ureteral calculi are noted. Urinary bladder is unremarkable. Stomach/Bowel: Stomach appears normal. Severe proximal small bowel dilatation is noted with transition zone seen in the lower abdomen best seen on image number 53 of series 2. Status post appendectomy. Vascular/Lymphatic: Aortic atherosclerosis. No enlarged abdominal or pelvic lymph nodes. Reproductive: Uterus and bilateral adnexa are unremarkable. Other: No abdominal wall hernia or abnormality. No abdominopelvic ascites. Musculoskeletal: No acute or significant osseous findings. IMPRESSION: Severe proximal small bowel dilatation is noted most consistent with obstruction, with transition zone seen in the lower abdomen. This is concerning for possible adhesion. 13 mm low density is noted peripherally in right hepatic lobe which was not present on prior exam. When the patient is able to hold still and follow instructions, MRI of the liver with and without gadolinium administration is recommended on nonemergent basis. Aortic Atherosclerosis (ICD10-I70.0). Electronically Signed   By: Marijo Conception, M.D.   On: 08/21/2017 21:15    Procedures Procedures (including critical care time)  Medications Ordered in ED Medications  sodium  chloride 0.9 % bolus 1,000 mL (0 mLs Intravenous Stopped 08/21/17 2237)  ondansetron (ZOFRAN) injection 4 mg (4 mg Intravenous Given 08/21/17 1738)  HYDROmorphone (DILAUDID) injection 1 mg (1 mg Intravenous Given 08/21/17 1738)  famotidine (PEPCID) IVPB 20 mg premix (0 mg Intravenous Stopped 08/21/17 1847)  iopamidol (ISOVUE-300) 61 % injection 100 mL (100 mLs Intravenous Contrast Given 08/21/17 2041)  HYDROmorphone (DILAUDID) injection 1 mg (1 mg Intravenous Given 08/21/17 2235)     Initial Impression / Assessment and Plan / ED Course  I have reviewed the triage vital signs and the nursing  notes.  Pertinent labs & imaging results that were available during my care of the patient were reviewed by me and considered in my medical decision making (see chart for details).     Patient with a small bowel obstruction seen on CT.  I spoke with the general surgeon Dr. Arnoldo Morale and he would like medicine to admit the patient he will see the patient in the morning.  He suggested putting an NG tube in but the patient refused this  Final Clinical Impressions(s) / ED Diagnoses   Final diagnoses:  None    ED Discharge Orders    None       Milton Ferguson, MD 08/21/17 2256

## 2017-08-21 NOTE — ED Triage Notes (Signed)
Abdominal pain with vomiting for 2 days

## 2017-08-21 NOTE — ED Notes (Signed)
T/c to 300, verified with Theadora Rama report was called

## 2017-08-21 NOTE — H&P (Signed)
[facility] H&P    Patient Demographics:    [name], is a 67 y.o. female  MRN: [id number]  DOB - Jan 06, 1951  Admit Date - 08/21/2017  Referring MD/NP/PA: Dr. Roderic Palau  Outpatient Primary MD for the patient is Redmond School, MD  Patient coming from: Home  Chief complaint-abdominal pain, vomiting   HPI:    [name]  is a 67 y.o. female, with history of hypertension, diabetes mellitus, multiple abdominal surgeries for laparoscopic incisional/umbilical/ventral hernia repair last surgery 3 years ago at [facility].  Today patient came to hospital as she was having vomiting and abdominal pain since Thursday.  Patient says that she had multiple episodes of vomiting and was hoping that it will get better off its own.  But when I did not get better today she came to the ED for further evaluation. In the ED CT scan of the abdomen showed small bowel obstruction. General surgery was consulted, Dr. Arnoldo Morale to see the patient in a.m. Patient denies diarrhea, her last bowel movement was yesterday Denies chest pain or shortness of breath. Denies fever or chills. Denies dysuria, urgency or frequency of urination.    Review of systems:      All other systems reviewed and are negative.   With Past History of the following :    Past Medical History:  Diagnosis Date  . Arthritis    "hands, knees, feet"  . Asthma   . Colon polyps   . COPD (chronic obstructive pulmonary disease) ([place])   . Hyperlipidemia   . Hypertension   . Peripheral edema   . Pneumonia 08/2010; 11/2010  . SBO (small bowel obstruction) ([place]) 08/25/11   recurrent  . Type II diabetes mellitus ([place])       Past Surgical History:  Procedure Laterality Date  . APPENDECTOMY    . CHOLECYSTECTOMY  1979  . COLONOSCOPY  2008   Dr. Oneida Alar: 44mm sessile cecal polyp and 15mm sessile transverse colon polyp, torturous sigmoid colon, fragments of tubular  adenomas on path   . COLONOSCOPY WITH PROPOFOL N/A 08/13/2015   Procedure: COLONOSCOPY WITH PROPOFOL;  Surgeon: Danie Binder, MD;  Location: [facility];  Service: Endoscopy;  Laterality: N/A;  9201  . ESOPHAGEAL DILATION     remote past by Dr. Tamala Julian  . HERNIA REPAIR    . KNEE CARTILAGE SURGERY  07/2003   right/E-chart  . LAPAROSCOPIC INCISIONAL / UMBILICAL / VENTRAL HERNIA REPAIR  ? til 2002   "~ 13 times"  . POLYPECTOMY  08/13/2015   Procedure: POLYPECTOMY;  Surgeon: Danie Binder, MD;  Location: [facility];  Service: Endoscopy;;  ascending colon polyp, hepatic flexure polyp, transverse colon polyp, descending colon polyp, sigmoid colon polyp, rectal polyps times 3  . SMALL INTESTINE SURGERY     Feb 2015 at [facility] and then remote past   . [place]      Social History:      Social History   Tobacco Use  . Smoking status: Former Smoker    Packs/day: 1.00  Years: 42.00    Pack years: 42.00    Types: Cigarettes  . Smokeless tobacco: Former Systems developer    Quit date: 10/30/2014  Substance Use Topics  . Alcohol use: No    Alcohol/week: 0.0 oz       Family History :     Family History  Problem Relation Age of Onset  . Colon cancer Mother 27      Home Medications:   Prior to Admission medications   Medication Sig Start Date End Date Taking? Authorizing Provider  busPIRone (BUSPAR) 10 MG tablet Take 10 mg by mouth 2 (two) times daily.  05/31/15  Yes [provider]  citalopram (CELEXA) 40 MG tablet Take 40 mg by mouth daily.   Yes [provider]  enalapril (VASOTEC) 20 MG tablet Take 20 mg by mouth daily.   Yes [provider]  gabapentin (NEURONTIN) 300 MG capsule Take 300 mg by mouth 3 (three) times daily.  02/03/14  Yes [provider]  lovastatin (MEVACOR) 40 MG tablet Take 40 mg by mouth at bedtime.   Yes [provider]  meloxicam (MOBIC) 15 MG tablet Take 15 mg by mouth daily.  05/31/15  Yes [provider]  sitaGLIPtin-metformin (JANUMET) 50-500 MG tablet Take by mouth.   Yes [provider]  tiZANidine (ZANAFLEX) 4 MG tablet Take by mouth.   Yes [provider]  traMADol (ULTRAM) 50 MG tablet Take 50 mg by mouth every 8 (eight) hours as needed for moderate pain.   Yes [provider]  albuterol (PROVENTIL HFA;VENTOLIN HFA) 108 (90 BASE) MCG/ACT inhaler Inhale 2 puffs into the lungs every 4 (four) hours as needed. For shortness of breath    [provider]  doxycycline (VIBRA-TABS) 100 MG tablet Take by mouth.    [provider]  HYDROcodone-acetaminophen (NORCO/VICODIN) 5-325 MG tablet Take 1 tablet by mouth every 6 (six) hours as needed for moderate pain. 08/08/15   Carole Civil, MD     Allergies:     Allergies  Allergen Reactions  . Hydrocodone-Acetaminophen Other (See Comments)    itching  . Penicillins Hives and Itching    H  . Vicodin [Hydrocodone-Acetaminophen]     itching     Physical Exam:   Vitals  Blood pressure 139/63, pulse 86, temperature 98.6 F (37 C), temperature source Oral, resp. rate 16, height 5\' 4"  (1.626 m), weight 83.2 kg (183 lb 6 oz), SpO2 92 %.  1.  General:  appears in no acute distress  2. Psychiatric:  Intact judgement and  insight, awake alert, oriented x 3.  3. Neurologic: No focal neurological deficits, all cranial nerves intact.Strength 5/5 all 4 extremities, sensation intact all 4 extremities, plantars down going.  4. Eyes :  anicteric sclerae, moist conjunctivae with no lid lag. PERRLA.  5. ENMT:  Oropharynx clear with moist mucous membranes and good dentition  6. Neck:  supple, no cervical lymphadenopathy appriciated, No thyromegaly  7. Respiratory : Normal respiratory effort, good air movement bilaterally,clear to  auscultation bilaterally  8. Cardiovascular : RRR, no gallops, rubs or murmurs, no leg edema  9. Gastrointestinal:  Positive bowel sounds, abdomen  soft, non-tender to palpation,no hepatosplenomegaly, no rigidity or guarding       10. Skin:  No cyanosis, normal texture and turgor, no rash, lesions or ulcers  11.Musculoskeletal:  Good muscle tone,  joints appear normal , no effusions,  normal range of motion    Data Review:  CBC Recent Labs  Lab 08/21/17 1723 08/21/17 1748  WBC 16.1*  --   HGB 15.0 15.6*  HCT 45.7 46.0  PLT 254  --   MCV 91.6  --   MCH 30.1  --   MCHC 32.8  --   RDW 12.9  --   LYMPHSABS 1.2  --   MONOABS 1.0  --   EOSABS 0.0  --   BASOSABS 0.0  --    ------------------------------------------------------------------------------------------------------------------  Chemistries  Recent Labs  Lab 08/21/17 1723 08/21/17 1748  NA 136 137  K 3.3* 3.3*  CL 95* 96*  CO2 27  --   GLUCOSE 216* 211*  BUN 28* 25*  CREATININE 0.92 0.80  CALCIUM 9.2  --   AST 12*  --   ALT 11*  --   ALKPHOS 66  --   BILITOT 1.2  --    ------------------------------------------------------------------------------------------------------------------  ------------------------------------------------------------------------------------------------------------------ GFR: Estimated Creatinine Clearance: 74.1 mL/min (by C-G formula based on SCr of 0.8 mg/dL). Liver Function Tests: Recent Labs  Lab 08/21/17 1723  AST 12*  ALT 11*  ALKPHOS 66  BILITOT 1.2  PROT 6.6  ALBUMIN 3.6    --------------------------------------------------------------------------------------------------------------- Urine analysis:    Component Value Date/Time   COLORURINE YELLOW 01/04/2011 0914   APPEARANCEUR CLOUDY (A) 01/04/2011 0914   LABSPEC 1.024 01/04/2011 0914   PHURINE 5.5 01/04/2011 0914   GLUCOSEU 100 (A) 01/04/2011 0914   HGBUR NEGATIVE 01/04/2011 0914   BILIRUBINUR NEGATIVE 01/04/2011 0914   KETONESUR NEGATIVE 01/04/2011 0914   PROTEINUR NEGATIVE 01/04/2011 0914   UROBILINOGEN 0.2 01/04/2011 0914   NITRITE NEGATIVE  01/04/2011 0914   LEUKOCYTESUR NEGATIVE 01/04/2011 0914      Imaging Results:    Ct Abdomen Pelvis W Contrast  Result Date: 08/21/2017 CLINICAL DATA:  Acute epigastric abdominal pain. EXAM: CT ABDOMEN AND PELVIS WITH CONTRAST TECHNIQUE: Multidetector CT imaging of the abdomen and pelvis was performed using the standard protocol following bolus administration of intravenous contrast. CONTRAST:  139mL ISOVUE-300 IOPAMIDOL (ISOVUE-300) INJECTION 61% COMPARISON:  CT scan of January 04, 2011. FINDINGS: Lower chest: No acute abnormality. Hepatobiliary: Status post cholecystectomy. Mild intrahepatic biliary dilatation is noted which may be due to post cholecystectomy status. 13 mm low density is noted in right hepatic lobe peripherally which demonstrates Hounsfield measurement of 40. Pancreas: Unremarkable. No pancreatic ductal dilatation or surrounding inflammatory changes. Spleen: Normal in size without focal abnormality. Adrenals/Urinary Tract: Adrenal glands appear normal. No hydronephrosis or renal obstruction is noted. No renal or ureteral calculi are noted. Urinary bladder is unremarkable. Stomach/Bowel: Stomach appears normal. Severe proximal small bowel dilatation is noted with transition zone seen in the lower abdomen best seen on image number 53 of series 2. Status post appendectomy. Vascular/Lymphatic: Aortic atherosclerosis. No enlarged abdominal or pelvic lymph nodes. Reproductive: Uterus and bilateral adnexa are unremarkable. Other: No abdominal wall hernia or abnormality. No abdominopelvic ascites. Musculoskeletal: No acute or significant osseous findings. IMPRESSION: Severe proximal small bowel dilatation is noted most consistent with obstruction, with transition zone seen in the lower abdomen. This is concerning for possible adhesion. 13 mm low density is noted peripherally in right hepatic lobe which was not present on prior exam. When the patient is able to hold still and follow instructions,  MRI of the liver with and without gadolinium administration is recommended on nonemergent basis. Aortic Atherosclerosis (ICD10-I70.0). Electronically Signed   By: Marijo Conception, M.D.   On: 08/21/2017 21:15    My personal review of EKG: Rhythm NSR  Assessment & Plan:    Active Problems:   SBO (small bowel obstruction) ([place])    1. Small bowel obstruction-we will keep patient n.p.o., started on IV normal saline.  Start Dilaudid 1 mg IV every 4 hours as needed for pain.  Zofran as needed for nausea and vomiting.  General surgery, Dr. Arnoldo Morale was consulted by ED physician.  He will see the patient in a.m.  We will keep patient n.p.o. in anticipation for surgery in a.m.  Patient refused NG tube placement which was recommended by Dr. Arnoldo Morale 2. Diabetes mellitus-hold p.o. medications, will start sliding scale insulin with NovoLog. 3. Hypertension-start hydralazine 10 mg IV every 6 hours as needed for BP greater than 160/100.  Hold p.o. enalapril 4. Abnormal liver density-seen on the CT scan abdominal there is 13 mm low-density noted in the right hepatic lobe which was not present on prior exam.  MRI liver recommended once patient is more stable on a nonurgent basis.    DVT Prophylaxis-   Lovenox   AM Labs Ordered, also please review Full Orders  Family Communication: Admission, patients condition and plan of care including tests being ordered have been discussed with the patient and her husband at bedside* who indicate understanding and agree with the plan and Code Status.  Code Status: Full code  Admission status: Inpatient  Time spent in minutes : 60 minutes   Oswald Hillock M.D on 08/21/2017 at 10:38 PM  Between 7am to 7pm - Pager - [phone]. After 7pm go to [email or web address] - password [facility]  [facility] - Office  [phone]

## 2017-08-22 ENCOUNTER — Encounter (HOSPITAL_COMMUNITY): Payer: Self-pay

## 2017-08-22 ENCOUNTER — Other Ambulatory Visit: Payer: Self-pay

## 2017-08-22 DIAGNOSIS — K56609 Unspecified intestinal obstruction, unspecified as to partial versus complete obstruction: Secondary | ICD-10-CM

## 2017-08-22 LAB — CBC
HEMATOCRIT: 45.2 % (ref 36.0–46.0)
HEMOGLOBIN: 14.8 g/dL (ref 12.0–15.0)
MCH: 30.7 pg (ref 26.0–34.0)
MCHC: 32.7 g/dL (ref 30.0–36.0)
MCV: 93.8 fL (ref 78.0–100.0)
Platelets: 229 10*3/uL (ref 150–400)
RBC: 4.82 MIL/uL (ref 3.87–5.11)
RDW: 13 % (ref 11.5–15.5)
WBC: 13.4 10*3/uL — AB (ref 4.0–10.5)

## 2017-08-22 LAB — COMPREHENSIVE METABOLIC PANEL
ALK PHOS: 60 U/L (ref 38–126)
ALT: 10 U/L — ABNORMAL LOW (ref 14–54)
AST: 10 U/L — AB (ref 15–41)
Albumin: 3.3 g/dL — ABNORMAL LOW (ref 3.5–5.0)
Anion gap: 10 (ref 5–15)
BILIRUBIN TOTAL: 0.8 mg/dL (ref 0.3–1.2)
BUN: 27 mg/dL — ABNORMAL HIGH (ref 6–20)
CALCIUM: 8.7 mg/dL — AB (ref 8.9–10.3)
CO2: 29 mmol/L (ref 22–32)
Chloride: 98 mmol/L — ABNORMAL LOW (ref 101–111)
Creatinine, Ser: 0.91 mg/dL (ref 0.44–1.00)
GFR calc Af Amer: >60 mL/min (ref >60)
Glucose, Bld: 159 mg/dL — ABNORMAL HIGH (ref 65–99)
POTASSIUM: 3.5 mmol/L (ref 3.5–5.1)
Sodium: 137 mmol/L (ref 135–145)
TOTAL PROTEIN: 6.1 g/dL — AB (ref 6.5–8.1)

## 2017-08-22 LAB — GLUCOSE, CAPILLARY
GLUCOSE-CAPILLARY: 167 mg/dL — AB (ref 65–99)
Glucose-Capillary: 110 mg/dL — ABNORMAL HIGH (ref 65–99)
Glucose-Capillary: 118 mg/dL — ABNORMAL HIGH (ref 65–99)
Glucose-Capillary: 127 mg/dL — ABNORMAL HIGH (ref 65–99)
Glucose-Capillary: 155 mg/dL — ABNORMAL HIGH (ref 65–99)

## 2017-08-22 LAB — HEMOGLOBIN A1C
Hgb A1c MFr Bld: 6.8 % — ABNORMAL HIGH (ref 4.8–5.6)
MEAN PLASMA GLUCOSE: 148.46 mg/dL

## 2017-08-22 MED ORDER — POLYETHYLENE GLYCOL 3350 17 G PO PACK
17.0000 g | PACK | Freq: Every day | ORAL | Status: DC
  Administered 2017-08-22 – 2017-08-23 (×2): 17 g via ORAL
  Filled 2017-08-22 (×2): qty 1

## 2017-08-22 NOTE — Consult Note (Signed)
Reason for Consult: Small bowel obstruction Referring Physician: Dr. Randel Books [name] is an 67 y.o. female.  HPI: Patient is a 67 year old white female status post multiple abdominal surgeries in the past at other facilities who presents with a 24-hour history of worsening nausea, vomiting, and abdominal distention.  A CT scan of the abdomen done in the emergency room revealed a small bowel obstruction most likely secondary to adhesive disease.  The patient was admitted to the hospital for further evaluation and treatment.  She refused an NG tube.  This morning, she says her abdominal pain has eased.  She has started passing gas.  She states her nausea is resolved.  This is her first episode of obstructive symptoms since her latest incisional herniorrhaphy with mesh at [facility] in 2016.  She has had multiple abdominal surgeries at [facility] due to her complex surgical history.  She currently has a pain level of 2 out of 10.  Past Medical History:  Diagnosis Date  . Arthritis    "hands, knees, feet"  . Asthma   . Colon polyps   . COPD (chronic obstructive pulmonary disease) ([facility])   . Hyperlipidemia   . Hypertension   . Peripheral edema   . Pneumonia 08/2010; 11/2010  . SBO (small bowel obstruction) ([place]) 08/25/11   recurrent  . Type II diabetes mellitus ([place])     Past Surgical History:  Procedure Laterality Date  . APPENDECTOMY    . CHOLECYSTECTOMY  1979  . COLONOSCOPY  2008   Dr. Oneida Alar: 20m sessile cecal polyp and 566msessile transverse colon polyp, torturous sigmoid colon, fragments of tubular adenomas on path   . COLONOSCOPY WITH PROPOFOL N/A 08/13/2015   Procedure: COLONOSCOPY WITH PROPOFOL;  Surgeon: SaDanie BinderMD;  [facility];  Service: Endoscopy;  Laterality: N/A; ;  1478. ESOPHAGEAL DILATION     remote past by Dr. SmTamala Julian. HERNIA REPAIR    . KNEE CARTILAGE SURGERY  07/2003   right/E-chart  . LAPAROSCOPIC INCISIONAL /  UMBILICAL / VENTRAL HERNIA REPAIR  ? til 2002   "~ 13 times"  . POLYPECTOMY  08/13/2015   Procedure: POLYPECTOMY;  Surgeon: SaDanie BinderMD;  Location: [facility];  Service: Endoscopy;;  ascending colon polyp, hepatic flexure polyp, transverse colon polyp, descending colon polyp, sigmoid colon polyp, rectal polyps times 3  . SMALL INTESTINE SURGERY     [facility] then [facility] past   . [place]  Family History  Problem Relation Age of Onset  . Colon cancer Mother 3634  Social History:  reports that she has quit smoking. Her smoking use included cigarettes. She has a 42.00 pack-year smoking history. She quit smokeless tobacco use about 2 years ago. She reports that she does not drink alcohol or use drugs.  Allergies:  Allergies  Allergen Reactions  . Hydrocodone-Acetaminophen Other (See Comments)    itching  . Penicillins Hives and Itching    Has patient had a PCN reaction causing immediate rash, facial/tongue/throat swelling, SOB or lightheadedness with hypotension: Yes Has patient had a PCN reaction causing severe rash involving mucus membranes or skin necrosis: No Has patient had a PCN reaction that required hospitalization No Has patient had a PCN reaction occurring within the last 10 years: No If all of the above answers are "NO", then may proceed with Cephalosporin use.  Has patient had a PCN reaction  causing immediate rash, facial/tongue/throat swelling, SOB or lightheadedness with hypotension: Yes Has patient had a PCN reaction causing severe rash involving mucus membranes or skin necrosis: No Has patient had a PCN reaction that required hospitalization No Has patient had a PCN reaction occurring within the last 10 years: No If all of the above answers are "NO", then may proceed with Cephalosporin use. Can tolerate cephalosporins, per pt Electronically signed by: Rudene Christians, [facility] 06/07/2013 10:43 AM  . Vicodin [Hydrocodone-Acetaminophen]      itching    Medications: I have reviewed the patient's current medications.  Results for orders placed or performed during the hospital encounter of 08/21/17 (from the past 48 hour(s))  CBC with Differential/Platelet     Status: Abnormal   Collection Time: 08/21/17  5:23 PM  Result Value Ref Range   WBC 16.1 (H) 4.0 - 10.5 K/uL   RBC 4.99 3.87 - 5.11 MIL/uL   Hemoglobin 15.0 12.0 - 15.0 g/dL   HCT 45.7 36.0 - 46.0 %   MCV 91.6 78.0 - 100.0 fL   MCH 30.1 26.0 - 34.0 pg   MCHC 32.8 30.0 - 36.0 g/dL   RDW 12.9 11.5 - 15.5 %   Platelets 254 150 - 400 K/uL   Neutrophils Relative % 85 %   Neutro Abs 13.8 (H) 1.7 - 7.7 K/uL   Lymphocytes Relative 8 %   Lymphs Abs 1.2 0.7 - 4.0 K/uL   Monocytes Relative 7 %   Monocytes Absolute 1.0 0.1 - 1.0 K/uL   Eosinophils Relative 0 %   Eosinophils Absolute 0.0 0.0 - 0.7 K/uL   Basophils Relative 0 %   Basophils Absolute 0.0 0.0 - 0.1 K/uL    Comment: Performed at [facility], [address] [place], [place] [address]  Comprehensive metabolic panel     Status: Abnormal   Collection Time: 08/21/17  5:23 PM  Result Value Ref Range   Sodium 136 135 - 145 mmol/L   Potassium 3.3 (L) 3.5 - 5.1 mmol/L   Chloride 95 (L) 101 - 111 mmol/L   CO2 27 22 - 32 mmol/L   Glucose, Bld 216 (H) 65 - 99 mg/dL   BUN 28 (H) 6 - 20 mg/dL   Creatinine, Ser 0.92 0.44 - 1.00 mg/dL   Calcium 9.2 8.9 - 10.3 mg/dL   Total Protein 6.6 6.5 - 8.1 g/dL   Albumin 3.6 3.5 - 5.0 g/dL   AST 12 (L) 15 - 41 U/L   ALT 11 (L) 14 - 54 U/L   Alkaline Phosphatase 66 38 - 126 U/L   Total Bilirubin 1.2 0.3 - 1.2 mg/dL   GFR calc non Af Amer >60 >60 mL/min   GFR calc Af Amer >60 >60 mL/min    Comment: (NOTE) The eGFR has been calculated using the CKD EPI equation. This calculation has not been validated in all clinical situations. eGFR's persistently <60 mL/min signify possible Chronic Kidney Disease.    Anion gap 14 5 - 15    Comment: Performed at [facility], [address], [place], [place] [address]  I-stat chem 8, ed     Status: Abnormal   Collection Time: 08/21/17  5:48 PM  Result Value Ref Range   Sodium 137 135 - 145 mmol/L   Potassium 3.3 (L) 3.5 - 5.1 mmol/L   Chloride 96 (L) 101 - 111 mmol/L   BUN 25 (H) 6 - 20 mg/dL   Creatinine, Ser 0.80 0.44 - 1.00 mg/dL   Glucose,  Bld 211 (H) 65 - 99 mg/dL   Calcium, Ion 1.09 (L) 1.15 - 1.40 mmol/L   TCO2 28 22 - 32 mmol/L   Hemoglobin 15.6 (H) 12.0 - 15.0 g/dL   HCT 46.0 36.0 - 46.0 %  Glucose, capillary     Status: Abnormal   Collection Time: 08/22/17 12:20 AM  Result Value Ref Range   Glucose-Capillary 167 (H) 65 - 99 mg/dL   Comment 1 Notify RN    Comment 2 Document in Chart   Glucose, capillary     Status: Abnormal   Collection Time: 08/22/17  6:33 AM  Result Value Ref Range   Glucose-Capillary 155 (H) 65 - 99 mg/dL   Comment 1 Notify RN    Comment 2 Document in Chart   CBC     Status: Abnormal   Collection Time: 08/22/17  6:56 AM  Result Value Ref Range   WBC 13.4 (H) 4.0 - 10.5 K/uL   RBC 4.82 3.87 - 5.11 MIL/uL   Hemoglobin 14.8 12.0 - 15.0 g/dL   HCT 45.2 36.0 - 46.0 %   MCV 93.8 78.0 - 100.0 fL   MCH 30.7 26.0 - 34.0 pg   MCHC 32.7 30.0 - 36.0 g/dL   RDW 13.0 11.5 - 15.5 %   Platelets 229 150 - 400 K/uL    Comment: Performed at [facility], [address] [place], [place] [address]  Comprehensive metabolic panel     Status: Abnormal   Collection Time: 08/22/17  6:56 AM  Result Value Ref Range   Sodium 137 135 - 145 mmol/L   Potassium 3.5 3.5 - 5.1 mmol/L   Chloride 98 (L) 101 - 111 mmol/L   CO2 29 22 - 32 mmol/L   Glucose, Bld 159 (H) 65 - 99 mg/dL   BUN 27 (H) 6 - 20 mg/dL   Creatinine, Ser 0.91 0.44 - 1.00 mg/dL   Calcium 8.7 (L) 8.9 - 10.3 mg/dL   Total Protein 6.1 (L) 6.5 - 8.1 g/dL   Albumin 3.3 (L) 3.5 - 5.0 g/dL   AST 10 (L) 15 - 41 U/L   ALT 10 (L) 14 - 54 U/L   Alkaline Phosphatase 60 38 - 126 U/L   Total Bilirubin 0.8 0.3 - 1.2 mg/dL   GFR calc non Af Amer >60 >60  mL/min   GFR calc Af Amer >60 >60 mL/min    Comment: (NOTE) The eGFR has been calculated using the CKD EPI equation. This calculation has not been validated in all clinical situations. eGFR's persistently <60 mL/min signify possible Chronic Kidney Disease.    Anion gap 10 5 - 15    Comment: Performed at [facility], [address] [place], [place] [address]    Ct Abdomen Pelvis W Contrast  Result Date: 08/21/2017 CLINICAL DATA:  Acute epigastric abdominal pain. EXAM: CT ABDOMEN AND PELVIS WITH CONTRAST TECHNIQUE: Multidetector CT imaging of the abdomen and pelvis was performed using the standard protocol following bolus administration of intravenous contrast. CONTRAST:  1109m ISOVUE-300 IOPAMIDOL (ISOVUE-300) INJECTION 61% COMPARISON:  CT scan of January 04, 2011. FINDINGS: Lower chest: No acute abnormality. Hepatobiliary: Status post cholecystectomy. Mild intrahepatic biliary dilatation is noted which may be due to post cholecystectomy status. 13 mm low density is noted in right hepatic lobe peripherally which demonstrates Hounsfield measurement of 40. Pancreas: Unremarkable. No pancreatic ductal dilatation or surrounding inflammatory changes. Spleen: Normal in size without focal abnormality. Adrenals/Urinary Tract: Adrenal glands appear normal. No hydronephrosis or renal obstruction  is noted. No renal or ureteral calculi are noted. Urinary bladder is unremarkable. Stomach/Bowel: Stomach appears normal. Severe proximal small bowel dilatation is noted with transition zone seen in the lower abdomen best seen on image number 53 of series 2. Status post appendectomy. Vascular/Lymphatic: Aortic atherosclerosis. No enlarged abdominal or pelvic lymph nodes. Reproductive: Uterus and bilateral adnexa are unremarkable. Other: No abdominal wall hernia or abnormality. No abdominopelvic ascites. Musculoskeletal: No acute or significant osseous findings. IMPRESSION: Severe proximal small bowel dilatation is noted  most consistent with obstruction, with transition zone seen in the lower abdomen. This is concerning for possible adhesion. 13 mm low density is noted peripherally in right hepatic lobe which was not present on prior exam. When the patient is able to hold still and follow instructions, MRI of the liver with and without gadolinium administration is recommended on nonemergent basis. Aortic Atherosclerosis (ICD10-I70.0). Electronically Signed   By: Marijo Conception, M.D.   On: 08/21/2017 21:15    ROS:  Pertinent items are noted in HPI.  Blood pressure 119/65, pulse 80, temperature 98.7 F (37.1 C), temperature source Oral, resp. rate 18, height 5' 4" (1.626 m), weight 185 lb 6.5 oz (84.1 kg), SpO2 96 %. Physical Exam: Pleasant well-developed well-nourished white female no acute distress Head is normocephalic, atraumatic Lungs clear to auscultation with equal breath sounds bilaterally Heart examination reveals regular rate and rhythm without S3, S4, murmurs Abdomen is soft and nontender.  Multiple surgical scars are present.  No appreciable hernias are noted.  Bowel sounds are present.  CT scan images personally reviewed  Assessment/Plan: Impression: Small bowel obstruction most likely secondary to adhesive disease, partial in nature Plan: No need for acute surgical intervention at this time.  Should the patient warrant surgical intervention, she would need to be transferred to [facility] for further care given her complex surgical history.  Will start clear liquid diet and MiraLAX.  Aviva Signs 08/22/2017, 11:00 AM

## 2017-08-22 NOTE — Progress Notes (Signed)
PROGRESS NOTE    [name]  SAY:[id number] DOB: 07-08-1950 DOA: 08/21/2017 PCP: Redmond School, MD     Brief Narrative:  67 year old woman admitted from home on 5/4 due to abdominal pain and distention as well as nausea and vomiting.  CT scan showed a small bowel obstruction most likely secondary to adhesions.  She has had multiple abdominal surgeries at [facility].  Surgical consult was requested and we are asked to admit her for further evaluation and management.   Assessment & Plan:   Active Problems:   SBO (small bowel obstruction) (HCC)   Small bowel obstruction -She has passed some flatus today and has noticed some abdominal rumbling. -Per Dr. Arnoldo Morale okay to increase diet to clear liquids, start MiraLAX, she is refusing NG tube placement. -Hopefully this will resolve conservatively, per Dr. Arnoldo Morale if she does warrant surgical intervention she will need transfer to [facility] given her complex surgical history.  Type 2 diabetes -Well-controlled, continue current regimen, oral hypoglycemic agents are on hold given n.p.o. state.  Hypertension -Well-controlled, receiving PRN hydralazine while n.p.o.  Abnormal liver density -Seen on CT scan.  Radiology is recommending MRI on an urgent basis for further evaluation.   DVT prophylaxis: Lovenox Code Status: Full code Family Communication: Son and sister at bedside updated on plan of care and all questions answered Disposition Plan: Home pending improvement/resolution of bowel obstruction  Consultants:   General surgery, Dr. Arnoldo Morale  Procedures:   None  Antimicrobials:  Anti-infectives (From admission, onward)   None       Subjective: Complains of some mild abdominal pain today, has passed some flatus, has had some abdominal rumbling.  Objective: Vitals:   08/21/17 2300 08/21/17 2330 08/22/17 0011 08/22/17 0511  BP: 129/68 (!) 117/59 137/76 119/65  Pulse: 89 88 80 80  Resp: 19 (!) 21 18 18     Temp:   98 F (36.7 C) 98.7 F (37.1 C)  TempSrc:   Oral Oral  SpO2: 94% 95% 94% 96%  Weight:   84.1 kg (185 lb 6.5 oz)   Height:   5\' 4"  (1.626 m)     Intake/Output Summary (Last 24 hours) at 08/22/2017 1538 Last data filed at 08/22/2017 0503 Gross per 24 hour  Intake 392.5 ml  Output -  Net 392.5 ml   Filed Weights   08/21/17 1654 08/22/17 0011  Weight: 83.2 kg (183 lb 6 oz) 84.1 kg (185 lb 6.5 oz)    Examination:  General exam: Alert, awake, oriented x 3 Respiratory system: Clear to auscultation. Respiratory effort normal. Cardiovascular system:RRR. No murmurs, rubs, gallops. Gastrointestinal system: Abdomen is slightly distended, nontender to palpation with hypoactive bowel sounds.  Central nervous system: Alert and oriented. No focal neurological deficits. Extremities: No C/C/E, +pedal pulses Skin: No rashes, lesions or ulcers Psychiatry: Judgement and insight appear normal. Mood & affect appropriate.     Data Reviewed: I have personally reviewed following labs and imaging studies  CBC: Recent Labs  Lab 08/21/17 1723 08/21/17 1748 08/22/17 0656  WBC 16.1*  --  13.4*  NEUTROABS 13.8*  --   --   HGB 15.0 15.6* 14.8  HCT 45.7 46.0 45.2  MCV 91.6  --  93.8  PLT 254  --  235   Basic Metabolic Panel: Recent Labs  Lab 08/21/17 1723 08/21/17 1748 08/22/17 0656  NA 136 137 137  K 3.3* 3.3* 3.5  CL 95* 96* 98*  CO2 27  --  29  GLUCOSE 216* 211* 159*  BUN 28* 25* 27*  CREATININE 0.92 0.80 0.91  CALCIUM 9.2  --  8.7*   GFR: Estimated Creatinine Clearance: 65.6 mL/min (by C-G formula based on SCr of 0.91 mg/dL). Liver Function Tests: Recent Labs  Lab 08/21/17 1723 08/22/17 0656  AST 12* 10*  ALT 11* 10*  ALKPHOS 66 60  BILITOT 1.2 0.8  PROT 6.6 6.1*  ALBUMIN 3.6 3.3*   No results for input(s): LIPASE, AMYLASE in the last 168 hours. No results for input(s): AMMONIA in the last 168 hours. Coagulation Profile: No results for input(s): INR, PROTIME  in the last 168 hours. Cardiac Enzymes: No results for input(s): CKTOTAL, CKMB, CKMBINDEX, TROPONINI in the last 168 hours. BNP (last 3 results) No results for input(s): PROBNP in the last 8760 hours. HbA1C: Recent Labs    08/21/17 1723  HGBA1C 6.8*   CBG: Recent Labs  Lab 08/22/17 0020 08/22/17 0633 08/22/17 1143  GLUCAP 167* 155* 110*   Lipid Profile: No results for input(s): CHOL, HDL, LDLCALC, TRIG, CHOLHDL, LDLDIRECT in the last 72 hours. Thyroid Function Tests: No results for input(s): TSH, T4TOTAL, FREET4, T3FREE, THYROIDAB in the last 72 hours. Anemia Panel: No results for input(s): VITAMINB12, FOLATE, FERRITIN, TIBC, IRON, RETICCTPCT in the last 72 hours. Urine analysis:    Component Value Date/Time   COLORURINE YELLOW 01/04/2011 0914   APPEARANCEUR CLOUDY (A) 01/04/2011 0914   LABSPEC 1.024 01/04/2011 0914   PHURINE 5.5 01/04/2011 0914   GLUCOSEU 100 (A) 01/04/2011 0914   HGBUR NEGATIVE 01/04/2011 0914   BILIRUBINUR NEGATIVE 01/04/2011 0914   KETONESUR NEGATIVE 01/04/2011 0914   PROTEINUR NEGATIVE 01/04/2011 0914   UROBILINOGEN 0.2 01/04/2011 0914   NITRITE NEGATIVE 01/04/2011 0914   LEUKOCYTESUR NEGATIVE 01/04/2011 0914   Sepsis Labs: @LABRCNTIP (procalcitonin:4,lacticidven:4)  )No results found for this or any previous visit (from the past 240 hour(s)).       Radiology Studies: Ct Abdomen Pelvis W Contrast  Result Date: 08/21/2017 CLINICAL DATA:  Acute epigastric abdominal pain. EXAM: CT ABDOMEN AND PELVIS WITH CONTRAST TECHNIQUE: Multidetector CT imaging of the abdomen and pelvis was performed using the standard protocol following bolus administration of intravenous contrast. CONTRAST:  134mL ISOVUE-300 IOPAMIDOL (ISOVUE-300) INJECTION 61% COMPARISON:  CT scan of January 04, 2011. FINDINGS: Lower chest: No acute abnormality. Hepatobiliary: Status post cholecystectomy. Mild intrahepatic biliary dilatation is noted which may be due to post  cholecystectomy status. 13 mm low density is noted in right hepatic lobe peripherally which demonstrates Hounsfield measurement of 40. Pancreas: Unremarkable. No pancreatic ductal dilatation or surrounding inflammatory changes. Spleen: Normal in size without focal abnormality. Adrenals/Urinary Tract: Adrenal glands appear normal. No hydronephrosis or renal obstruction is noted. No renal or ureteral calculi are noted. Urinary bladder is unremarkable. Stomach/Bowel: Stomach appears normal. Severe proximal small bowel dilatation is noted with transition zone seen in the lower abdomen best seen on image number 53 of series 2. Status post appendectomy. Vascular/Lymphatic: Aortic atherosclerosis. No enlarged abdominal or pelvic lymph nodes. Reproductive: Uterus and bilateral adnexa are unremarkable. Other: No abdominal wall hernia or abnormality. No abdominopelvic ascites. Musculoskeletal: No acute or significant osseous findings. IMPRESSION: Severe proximal small bowel dilatation is noted most consistent with obstruction, with transition zone seen in the lower abdomen. This is concerning for possible adhesion. 13 mm low density is noted peripherally in right hepatic lobe which was not present on prior exam. When the patient is able to hold still and follow instructions, MRI of the liver with and without gadolinium administration is recommended  on nonemergent basis. Aortic Atherosclerosis (ICD10-I70.0). Electronically Signed   By: Marijo Conception, M.D.   On: 08/21/2017 21:15        Scheduled Meds: . enoxaparin (LOVENOX) injection  40 mg Subcutaneous Q24H  . insulin aspart  0-9 Units Subcutaneous TID WC  . polyethylene glycol  17 g Oral Daily   Continuous Infusions: . sodium chloride 75 mL/hr at 08/22/17 0029     LOS: 1 day    Time spent: 25 minutes.     Lelon Frohlich, MD [facility] Pager [phone]  If 7PM-7AM, please contact night-coverage [email or web address] Password  [facility] 08/22/2017, 3:38 PM

## 2017-08-23 LAB — CBC
HCT: 43 % (ref 36.0–46.0)
Hemoglobin: 13.8 g/dL (ref 12.0–15.0)
MCH: 30.5 pg (ref 26.0–34.0)
MCHC: 32.1 g/dL (ref 30.0–36.0)
MCV: 95.1 fL (ref 78.0–100.0)
Platelets: 200 10*3/uL (ref 150–400)
RBC: 4.52 MIL/uL (ref 3.87–5.11)
RDW: 13.1 % (ref 11.5–15.5)
WBC: 11 10*3/uL — ABNORMAL HIGH (ref 4.0–10.5)

## 2017-08-23 LAB — BASIC METABOLIC PANEL
Anion gap: 9 (ref 5–15)
BUN: 31 mg/dL — ABNORMAL HIGH (ref 6–20)
CALCIUM: 8.3 mg/dL — AB (ref 8.9–10.3)
CHLORIDE: 99 mmol/L — AB (ref 101–111)
CO2: 30 mmol/L (ref 22–32)
CREATININE: 1.01 mg/dL — AB (ref 0.44–1.00)
GFR calc non Af Amer: 58 mL/min — ABNORMAL LOW (ref >60)
Glucose, Bld: 127 mg/dL — ABNORMAL HIGH (ref 65–99)
Potassium: 3.6 mmol/L (ref 3.5–5.1)
SODIUM: 138 mmol/L (ref 135–145)

## 2017-08-23 LAB — HIV ANTIBODY (ROUTINE TESTING W REFLEX): HIV SCREEN 4TH GENERATION: NONREACTIVE

## 2017-08-23 LAB — GLUCOSE, CAPILLARY
GLUCOSE-CAPILLARY: 122 mg/dL — AB (ref 65–99)
Glucose-Capillary: 130 mg/dL — ABNORMAL HIGH (ref 65–99)

## 2017-08-23 MED ORDER — HYDROCODONE-ACETAMINOPHEN 5-325 MG PO TABS: 1.0000 | ORAL_TABLET | Freq: Four times a day (QID) | ORAL | 0 refills | Status: DC | PRN

## 2017-08-23 NOTE — Care Management Important Message (Signed)
Important Message  Patient Details  Name: [name] MRN: [id number] Date of Birth: 09-05-1950   Medicare Important Message Given:  Yes    Shelda Altes 08/23/2017, 11:30 AM

## 2017-08-23 NOTE — Progress Notes (Signed)
  Subjective: Patient has had multiple bowel movements overnight.  Her nausea is resolved.  Her abdominal pain is intermittent and crampy in nature.  Objective: Vital signs in last 24 hours: Temp:  [98.2 F (36.8 C)-98.6 F (37 C)] 98.6 F (37 C) (05/06 0438) Pulse Rate:  [84-97] 97 (05/06 0438) Resp:  [18-20] 18 (05/06 0438) BP: (107-122)/(49-64) 111/64 (05/06 0438) SpO2:  [81 %-92 %] 81 % (05/06 0438) Last BM Date: 08/19/17  Intake/Output from previous day: 05/05 0701 - 05/06 0700 In: 1822.5 [I.V.:1822.5] Out: 300 [Urine:300] Intake/Output this shift: No intake/output data recorded.  General appearance: alert, cooperative and no distress GI: Soft, bowel sounds active.  No rigidity noted.  Lab Results:  Recent Labs    08/22/17 0656 08/23/17 0607  WBC 13.4* 11.0*  HGB 14.8 13.8  HCT 45.2 43.0  PLT 229 200   BMET Recent Labs    08/22/17 0656 08/23/17 0607  NA 137 138  K 3.5 3.6  CL 98* 99*  CO2 29 30  GLUCOSE 159* 127*  BUN 27* 31*  CREATININE 0.91 1.01*  CALCIUM 8.7* 8.3*   PT/INR No results for input(s): LABPROT, INR in the last 72 hours.  Studies/Results: Ct Abdomen Pelvis W Contrast  Result Date: 08/21/2017 CLINICAL DATA:  Acute epigastric abdominal pain. EXAM: CT ABDOMEN AND PELVIS WITH CONTRAST TECHNIQUE: Multidetector CT imaging of the abdomen and pelvis was performed using the standard protocol following bolus administration of intravenous contrast. CONTRAST:  124mL ISOVUE-300 IOPAMIDOL (ISOVUE-300) INJECTION 61% COMPARISON:  CT scan of January 04, 2011. FINDINGS: Lower chest: No acute abnormality. Hepatobiliary: Status post cholecystectomy. Mild intrahepatic biliary dilatation is noted which may be due to post cholecystectomy status. 13 mm low density is noted in right hepatic lobe peripherally which demonstrates Hounsfield measurement of 40. Pancreas: Unremarkable. No pancreatic ductal dilatation or surrounding inflammatory changes. Spleen: Normal in  size without focal abnormality. Adrenals/Urinary Tract: Adrenal glands appear normal. No hydronephrosis or renal obstruction is noted. No renal or ureteral calculi are noted. Urinary bladder is unremarkable. Stomach/Bowel: Stomach appears normal. Severe proximal small bowel dilatation is noted with transition zone seen in the lower abdomen best seen on image number 53 of series 2. Status post appendectomy. Vascular/Lymphatic: Aortic atherosclerosis. No enlarged abdominal or pelvic lymph nodes. Reproductive: Uterus and bilateral adnexa are unremarkable. Other: No abdominal wall hernia or abnormality. No abdominopelvic ascites. Musculoskeletal: No acute or significant osseous findings. IMPRESSION: Severe proximal small bowel dilatation is noted most consistent with obstruction, with transition zone seen in the lower abdomen. This is concerning for possible adhesion. 13 mm low density is noted peripherally in right hepatic lobe which was not present on prior exam. When the patient is able to hold still and follow instructions, MRI of the liver with and without gadolinium administration is recommended on nonemergent basis. Aortic Atherosclerosis (ICD10-I70.0). Electronically Signed   By: Marijo Conception, M.D.   On: 08/21/2017 21:15    Anti-infectives: Anti-infectives (From admission, onward)   None      Assessment/Plan: Impression: Partial small bowel obstruction secondary to adhesive disease, resolving Plan: No need for acute surgical intervention at this time.  Patient wishes to go home, which is fine with me.  She was instructed to follow-up with her surgeons at [facility].  LOS: 2 days    Aviva Signs 08/23/2017

## 2017-08-23 NOTE — Progress Notes (Signed)
Discharge instructions given to patient and patients husband.  Both verbalized understanding of instructions. Pt discharged home with husband.

## 2017-08-23 NOTE — Discharge Summary (Signed)
Physician Discharge Summary  [name] AST:[id number] DOB: 12-Mar-1951 DOA: 08/21/2017  PCP: Redmond School, MD  Admit date: 08/21/2017 Discharge date: 08/23/2017  Time spent: 45 minutes  Recommendations for Outpatient Follow-up:  -To be discharged home today. -Advise follow-up with PCP in 2 weeks. -Was noted on CT scan to have an abnormal liver density, radiology is recommending an MRI of the abdomen for further evaluation once her acute issues have resolved.  Discharge Diagnoses:  Active Problems:   SBO (small bowel obstruction) ([place])   Discharge Condition: Stable and improved  Filed Weights   08/21/17 1654 08/22/17 0011  Weight: 83.2 kg (183 lb 6 oz) 84.1 kg (185 lb 6.5 oz)    History of present illness:  As per Dr. Darrick Meigs on 5/4: [name]  is a 67 y.o. female, with history of hypertension, diabetes mellitus, multiple abdominal surgeries for laparoscopic incisional/umbilical/ventral hernia repair last surgery 3 years ago at [facility].  Today patient came to hospital as she was having vomiting and abdominal pain since Thursday.  Patient says that she had multiple episodes of vomiting and was hoping that it will get better off its own.  But when I did not get better today she came to the ED for further evaluation. In the ED CT scan of the abdomen showed small bowel obstruction. General surgery was consulted, Dr. Arnoldo Morale to see the patient in a.m. Patient denies diarrhea, her last bowel movement was yesterday Denies chest pain or shortness of breath. Denies fever or chills. Denies dysuria, urgency or frequency of urination.    Hospital Course:   Small bowel obstruction -Has resolved conservatively, she has had multiple bowel movements and is tolerating diet. -Evaluated by Dr. Arnoldo Morale.  Okay for discharge home today.  Type 2 diabetes -Well-controlled, continue home regimen  Hypertension -Well-controlled.  Abnormal liver density -Seen on CT scan.  Radiology  is recommending MRI on an urgent basis for further evaluation.     Procedures:  None   Consultations:  General Surgery  Discharge Instructions  Discharge Instructions    Diet - low sodium heart healthy   Complete by:  As directed    Increase activity slowly   Complete by:  As directed      Allergies as of 08/23/2017      Reactions   Hydrocodone-acetaminophen Other (See Comments)   itching   Penicillins Hives, Itching   Has patient had a PCN reaction causing immediate rash, facial/tongue/throat swelling, SOB or lightheadedness with hypotension: Yes Has patient had a PCN reaction causing severe rash involving mucus membranes or skin necrosis: No Has patient had a PCN reaction that required hospitalization No Has patient had a PCN reaction occurring within the last 10 years: No If all of the above answers are "NO", then may proceed with Cephalosporin use. Has patient had a PCN reaction causing immediate rash, facial/tongue/throat swelling, SOB or lightheadedness with hypotension: Yes Has patient had a PCN reaction causing severe rash involving mucus membranes or skin necrosis: No Has patient had a PCN reaction that required hospitalization No Has patient had a PCN reaction occurring within the last 10 years: No If all of the above answers are "NO", then may proceed with Cephalosporin use. Can tolerate cephalosporins, per pt Electronically signed by: Rudene Christians, [facility] 06/07/2013 10:43 AM   Vicodin [hydrocodone-acetaminophen]    itching      Medication List    STOP taking these medications   doxycycline 100 MG tablet Commonly known as:  VIBRA-TABS  meloxicam 15 MG tablet Commonly known as:  MOBIC     TAKE these medications   albuterol 108 (90 Base) MCG/ACT inhaler Commonly known as:  PROVENTIL HFA;VENTOLIN HFA Inhale 2 puffs into the lungs every 4 (four) hours as needed. For shortness of breath   busPIRone 10 MG tablet Commonly known as:  BUSPAR Take 10 mg by  mouth 2 (two) times daily.   citalopram 40 MG tablet Commonly known as:  CELEXA Take 40 mg by mouth daily.   enalapril 20 MG tablet Commonly known as:  VASOTEC Take 20 mg by mouth daily.   gabapentin 300 MG capsule Commonly known as:  NEURONTIN Take 300 mg by mouth 3 (three) times daily.   HYDROcodone-acetaminophen 5-325 MG tablet Commonly known as:  NORCO/VICODIN Take 1 tablet by mouth every 6 (six) hours as needed for moderate pain.   lovastatin 40 MG tablet Commonly known as:  MEVACOR Take 40 mg by mouth at bedtime.   sitaGLIPtin-metformin 50-500 MG tablet Commonly known as:  JANUMET Take by mouth.   tiZANidine 4 MG tablet Commonly known as:  ZANAFLEX Take by mouth.   traMADol 50 MG tablet Commonly known as:  ULTRAM Take 50 mg by mouth every 8 (eight) hours as needed for moderate pain.      Allergies  Allergen Reactions  . Hydrocodone-Acetaminophen Other (See Comments)    itching  . Penicillins Hives and Itching    Has patient had a PCN reaction causing immediate rash, facial/tongue/throat swelling, SOB or lightheadedness with hypotension: Yes Has patient had a PCN reaction causing severe rash involving mucus membranes or skin necrosis: No Has patient had a PCN reaction that required hospitalization No Has patient had a PCN reaction occurring within the last 10 years: No If all of the above answers are "NO", then may proceed with Cephalosporin use.  Has patient had a PCN reaction causing immediate rash, facial/tongue/throat swelling, SOB or lightheadedness with hypotension: Yes Has patient had a PCN reaction causing severe rash involving mucus membranes or skin necrosis: No Has patient had a PCN reaction that required hospitalization No Has patient had a PCN reaction occurring within the last 10 years: No If all of the above answers are "NO", then may proceed with Cephalosporin use. Can tolerate cephalosporins, per pt Electronically signed by: Rudene Christians,  [facility] 06/07/2013 10:43 AM  . Vicodin [Hydrocodone-Acetaminophen]     itching   Follow-up Information    Redmond School, MD. Schedule an appointment as soon as possible for a visit in 2 week(s).   Specialty:  Internal Medicine Contact information: [address] [place] [address] [phone]            The results of significant diagnostics from this hospitalization (including imaging, microbiology, ancillary and laboratory) are listed below for reference.    Significant Diagnostic Studies: Ct Abdomen Pelvis W Contrast  Result Date: 08/21/2017 CLINICAL DATA:  Acute epigastric abdominal pain. EXAM: CT ABDOMEN AND PELVIS WITH CONTRAST TECHNIQUE: Multidetector CT imaging of the abdomen and pelvis was performed using the standard protocol following bolus administration of intravenous contrast. CONTRAST:  111mL ISOVUE-300 IOPAMIDOL (ISOVUE-300) INJECTION 61% COMPARISON:  CT scan of January 04, 2011. FINDINGS: Lower chest: No acute abnormality. Hepatobiliary: Status post cholecystectomy. Mild intrahepatic biliary dilatation is noted which may be due to post cholecystectomy status. 13 mm low density is noted in right hepatic lobe peripherally which demonstrates Hounsfield measurement of 40. Pancreas: Unremarkable. No pancreatic ductal dilatation or surrounding inflammatory changes. Spleen: Normal in size  without focal abnormality. Adrenals/Urinary Tract: Adrenal glands appear normal. No hydronephrosis or renal obstruction is noted. No renal or ureteral calculi are noted. Urinary bladder is unremarkable. Stomach/Bowel: Stomach appears normal. Severe proximal small bowel dilatation is noted with transition zone seen in the lower abdomen best seen on image number 53 of series 2. Status post appendectomy. Vascular/Lymphatic: Aortic atherosclerosis. No enlarged abdominal or pelvic lymph nodes. Reproductive: Uterus and bilateral adnexa are unremarkable. Other: No abdominal wall hernia or  abnormality. No abdominopelvic ascites. Musculoskeletal: No acute or significant osseous findings. IMPRESSION: Severe proximal small bowel dilatation is noted most consistent with obstruction, with transition zone seen in the lower abdomen. This is concerning for possible adhesion. 13 mm low density is noted peripherally in right hepatic lobe which was not present on prior exam. When the patient is able to hold still and follow instructions, MRI of the liver with and without gadolinium administration is recommended on nonemergent basis. Aortic Atherosclerosis (ICD10-I70.0). Electronically Signed   By: Marijo Conception, M.D.   On: 08/21/2017 21:15    Microbiology: No results found for this or any previous visit (from the past 240 hour(s)).   Labs: Basic Metabolic Panel: Recent Labs  Lab 08/21/17 1723 08/21/17 1748 08/22/17 0656 08/23/17 0607  NA 136 137 137 138  K 3.3* 3.3* 3.5 3.6  CL 95* 96* 98* 99*  CO2 27  --  29 30  GLUCOSE 216* 211* 159* 127*  BUN 28* 25* 27* 31*  CREATININE 0.92 0.80 0.91 1.01*  CALCIUM 9.2  --  8.7* 8.3*   Liver Function Tests: Recent Labs  Lab 08/21/17 1723 08/22/17 0656  AST 12* 10*  ALT 11* 10*  ALKPHOS 66 60  BILITOT 1.2 0.8  PROT 6.6 6.1*  ALBUMIN 3.6 3.3*   No results for input(s): LIPASE, AMYLASE in the last 168 hours. No results for input(s): AMMONIA in the last 168 hours. CBC: Recent Labs  Lab 08/21/17 1723 08/21/17 1748 08/22/17 0656 08/23/17 0607  WBC 16.1*  --  13.4* 11.0*  NEUTROABS 13.8*  --   --   --   HGB 15.0 15.6* 14.8 13.8  HCT 45.7 46.0 45.2 43.0  MCV 91.6  --  93.8 95.1  PLT 254  --  229 200   Cardiac Enzymes: No results for input(s): CKTOTAL, CKMB, CKMBINDEX, TROPONINI in the last 168 hours. BNP: BNP (last 3 results) No results for input(s): BNP in the last 8760 hours.  ProBNP (last 3 results) No results for input(s): PROBNP in the last 8760 hours.  CBG: Recent Labs  Lab 08/22/17 1143 08/22/17 1639  08/22/17 2350 08/23/17 0614 08/23/17 1112  GLUCAP 110* 127* 118* 130* 122*       Signed:  [facility] Pager: [phone] 08/23/2017, 6:36 PM

## 2017-08-26 DIAGNOSIS — E6609 Other obesity due to excess calories: Secondary | ICD-10-CM | POA: Diagnosis not present

## 2017-08-26 DIAGNOSIS — K56609 Unspecified intestinal obstruction, unspecified as to partial versus complete obstruction: Secondary | ICD-10-CM | POA: Diagnosis not present

## 2017-08-26 DIAGNOSIS — Z6831 Body mass index (BMI) 31.0-31.9, adult: Secondary | ICD-10-CM | POA: Diagnosis not present

## 2017-08-27 DIAGNOSIS — Z6831 Body mass index (BMI) 31.0-31.9, adult: Secondary | ICD-10-CM | POA: Diagnosis not present

## 2017-08-27 DIAGNOSIS — E6609 Other obesity due to excess calories: Secondary | ICD-10-CM | POA: Diagnosis not present

## 2017-08-27 DIAGNOSIS — K56609 Unspecified intestinal obstruction, unspecified as to partial versus complete obstruction: Secondary | ICD-10-CM | POA: Diagnosis not present

## 2017-08-30 ENCOUNTER — Other Ambulatory Visit: Payer: Self-pay

## 2017-08-30 NOTE — Patient Outreach (Signed)
[facility]) Care Management  08/30/2017  AYAKO TAPANES 1950-09-10 [id number]   EMMI- General Discharge RED ON EMMI ALERT Day # 4 Date:  08/30/17 Red Alert Reason: Lost interest in things? Yes Sad/hopeless/anxious/empty? yes   Outreach attempt # 1 spoke with patient.  She is able to verify HIPAA.  Discussed reason for call and red alerts.  Patient denies any problems with red alert triggers. Patient reports she is doing well since discharge just some weakness.  Patient has seen PCP since being out of the hospital and is due to see MD again on Thursday. Patient denies any problems with transportation.  Patient taking her medications as prescribed.  Patient declines any present needs.   Plan: RN CM will send letter and close case.    Jone Baseman, RN, MSN [facility] Care Management Care Management Coordinator Direct Line [phone] Toll Free: [phone]  Fax: [phone]

## 2017-09-02 ENCOUNTER — Other Ambulatory Visit (HOSPITAL_COMMUNITY): Payer: Self-pay | Admitting: Physician Assistant

## 2017-09-02 ENCOUNTER — Ambulatory Visit (HOSPITAL_COMMUNITY)
Admission: RE | Admit: 2017-09-02 | Discharge: 2017-09-02 | Disposition: A | Discharge status: Home / Self Care | Payer: Medicare HMO | Source: Ambulatory Visit | Attending: Physician Assistant | Admitting: Physician Assistant

## 2017-09-02 DIAGNOSIS — K56609 Unspecified intestinal obstruction, unspecified as to partial versus complete obstruction: Secondary | ICD-10-CM | POA: Diagnosis not present

## 2017-09-02 DIAGNOSIS — M1712 Unilateral primary osteoarthritis, left knee: Secondary | ICD-10-CM | POA: Insufficient documentation

## 2017-09-02 DIAGNOSIS — S8992XA Unspecified injury of left lower leg, initial encounter: Secondary | ICD-10-CM | POA: Diagnosis not present

## 2017-09-02 DIAGNOSIS — M79605 Pain in left leg: Secondary | ICD-10-CM | POA: Insufficient documentation

## 2017-09-02 DIAGNOSIS — E6609 Other obesity due to excess calories: Secondary | ICD-10-CM | POA: Diagnosis not present

## 2017-09-02 DIAGNOSIS — K769 Liver disease, unspecified: Secondary | ICD-10-CM | POA: Diagnosis not present

## 2017-09-02 DIAGNOSIS — M25562 Pain in left knee: Secondary | ICD-10-CM | POA: Diagnosis not present

## 2017-09-02 DIAGNOSIS — Z6830 Body mass index (BMI) 30.0-30.9, adult: Secondary | ICD-10-CM | POA: Diagnosis not present

## 2017-09-22 ENCOUNTER — Other Ambulatory Visit: Payer: Self-pay | Admitting: Pharmacist

## 2017-09-22 NOTE — Patient Outreach (Addendum)
[name]) Care Management  09/22/2017  [name] [birth year] [id number]   Incoming call from Irene Shipper in response to the [facility] Medication Adherence Campaign. Speak with patient. HIPAA identifiers verified and verbal consent received.  [name] reports that she takes her Janumet twice daily as directed. Denies any missed doses or barriers to adherence. Counsel patient on the importance of medication adherence. [name] reports that her blood sugars have been good recently, 121 mg/dL first thing this morning. Reports that she may not have filled the prescription as regularly early in the year as she had received samples from her doctor.   Denies any medication questions/concerns at this time. Will close pharmacy episode.  Harlow Asa, PharmD, [facility] Management [phone]

## 2017-10-01 ENCOUNTER — Other Ambulatory Visit (HOSPITAL_COMMUNITY): Payer: Self-pay | Admitting: Family Medicine

## 2017-10-01 ENCOUNTER — Ambulatory Visit (HOSPITAL_COMMUNITY)
Admission: RE | Admit: 2017-10-01 | Discharge: 2017-10-01 | Disposition: A | Discharge status: Home / Self Care | Payer: Medicare HMO | Source: Ambulatory Visit | Attending: Family Medicine | Admitting: Family Medicine

## 2017-10-01 DIAGNOSIS — Z6830 Body mass index (BMI) 30.0-30.9, adult: Secondary | ICD-10-CM | POA: Insufficient documentation

## 2017-10-01 DIAGNOSIS — E6609 Other obesity due to excess calories: Secondary | ICD-10-CM | POA: Insufficient documentation

## 2017-10-01 DIAGNOSIS — K56609 Unspecified intestinal obstruction, unspecified as to partial versus complete obstruction: Secondary | ICD-10-CM | POA: Diagnosis not present

## 2017-10-01 DIAGNOSIS — K5649 Other impaction of intestine: Secondary | ICD-10-CM

## 2017-10-01 DIAGNOSIS — R059 Cough, unspecified: Secondary | ICD-10-CM

## 2017-10-01 DIAGNOSIS — R05 Cough: Secondary | ICD-10-CM

## 2017-10-01 DIAGNOSIS — R5383 Other fatigue: Secondary | ICD-10-CM | POA: Diagnosis not present

## 2017-10-08 ENCOUNTER — Encounter: Payer: Self-pay | Admitting: Orthopedic Surgery

## 2017-10-08 ENCOUNTER — Ambulatory Visit: Payer: Medicare HMO | Admitting: Orthopedic Surgery

## 2017-10-08 VITALS — BP 108/55 | HR 84 | Ht 64.0 in | Wt 177.0 lb

## 2017-10-08 DIAGNOSIS — M25562 Pain in left knee: Secondary | ICD-10-CM | POA: Diagnosis not present

## 2017-10-08 NOTE — Patient Instructions (Signed)

## 2017-10-08 NOTE — Progress Notes (Signed)
Addendum created to put in medications, patient brought in list

## 2017-10-08 NOTE — Progress Notes (Signed)
New problem visit  Chief Complaint  Patient presents with  . Knee Injury    Left knee pain, fell 8 weeks ago.    67 year old female fell 8 weeks ago injured her left knee comes in for evaluation  She complains of a dull aching pain of her left leg she says her knee really does not hurt she says she had no knee pain prior to the fall.  Further questioning however reveals that she does have some medial and lateral joint pain swelling for the last 8 weeks associated with the aching of the leg and giving way of the leg   Review of Systems  Musculoskeletal: Negative for back pain.  Neurological: Negative for tingling and weakness.     Past Medical History:  Diagnosis Date  . Arthritis    "hands, knees, feet"  . Asthma   . Colon polyps   . COPD (chronic obstructive pulmonary disease) ([place])   . Hyperlipidemia   . Hypertension   . Peripheral edema   . Pneumonia 08/2010; 11/2010  . SBO (small bowel obstruction) ([place]) 08/25/11   recurrent  . Type II diabetes mellitus ([place])     Past Surgical History:  Procedure Laterality Date  . APPENDECTOMY    . CHOLECYSTECTOMY  1979  . COLONOSCOPY  2008   Dr. Oneida Alar: 32mm sessile cecal polyp and 8mm sessile transverse colon polyp, torturous sigmoid colon, fragments of tubular adenomas on path   . COLONOSCOPY WITH PROPOFOL N/A 08/13/2015   Procedure: COLONOSCOPY WITH PROPOFOL;  Surgeon: Danie Binder, MD;  Location: [facility];  Service: Endoscopy;  Laterality: N/A;  [id number]  . ESOPHAGEAL DILATION     remote past by Dr. Tamala Julian  . HERNIA REPAIR    . KNEE CARTILAGE SURGERY  07/2003   right/E-chart  . LAPAROSCOPIC INCISIONAL / UMBILICAL / VENTRAL HERNIA REPAIR  ? til 2002   "~ 13 times"  . POLYPECTOMY  08/13/2015   Procedure: POLYPECTOMY;  Surgeon: Danie Binder, MD;  Location: [facility];  Service: Endoscopy;;  ascending colon polyp, hepatic flexure polyp, transverse colon polyp, descending colon polyp, sigmoid colon polyp, rectal polyps times  3  . SMALL INTESTINE SURGERY     Feb 2015 at [facility] and then remote past   . [place]    Family History  Problem Relation Age of Onset  . Colon cancer Mother 43   Social History   Tobacco Use  . Smoking status: Former Smoker    Packs/day: 1.00    Years: 42.00    Pack years: 42.00    Types: Cigarettes  . Smokeless tobacco: Former Systems developer    Quit date: 10/30/2014  Substance Use Topics  . Alcohol use: No    Alcohol/week: 0.0 oz  . Drug use: No    Allergies  Allergen Reactions  . Hydrocodone-Acetaminophen Other (See Comments)    itching  . Penicillins Hives and Itching    Has patient had a PCN reaction causing immediate rash, facial/tongue/throat swelling, SOB or lightheadedness with hypotension: Yes Has patient had a PCN reaction causing severe rash involving mucus membranes or skin necrosis: No Has patient had a PCN reaction that required hospitalization No Has patient had a PCN reaction occurring within the last 10 years: No If all of the above answers are "NO", then may proceed with Cephalosporin use.  Has patient had a PCN reaction causing immediate rash, facial/tongue/throat swelling, SOB or lightheadedness with hypotension: Yes Has patient had a PCN reaction causing  severe rash involving mucus membranes or skin necrosis: No Has patient had a PCN reaction that required hospitalization No Has patient had a PCN reaction occurring within the last 10 years: No If all of the above answers are "NO", then may proceed with Cephalosporin use. Can tolerate cephalosporins, per pt Electronically signed by: Rudene Christians, [facility] 06/07/2013 10:43 AM  . Vicodin [Hydrocodone-Acetaminophen]     itching    No outpatient medications have been marked as taking for the 10/08/17 encounter (Office Visit) with Carole Civil, MD.    BP (!) 108/55   Pulse 84   Ht 5\' 4"  (1.626 m)   Wt 177 lb (80.3 kg)   BMI 30.38 kg/m   Physical Exam  Constitutional: She is oriented to  person, place, and time. She appears well-developed and well-nourished. No distress.  Lymphadenopathy:    She has no cervical adenopathy.       Right: No epitrochlear adenopathy present.       Left: No epitrochlear adenopathy present.  Neurological: She is alert and oriented to person, place, and time. She has normal reflexes. No cranial nerve deficit or sensory deficit. Coordination normal.  Skin: Skin is warm, dry and intact. No rash noted. She is not diaphoretic. No erythema. No pallor.  Psychiatric: She has a normal mood and affect. Her behavior is normal. Judgment and thought content normal. Cognition and memory are not impaired.  Vitals reviewed.   Ortho Exam  Left knee medial and lateral joint line tenderness flexion arc is 115 degrees knee is stable meniscal signs are negative strength is normal skin is normal sensation is normal pulses normal  Right knee no range of motion deficits swelling or tenderness no alignment problems  MEDICAL DECISION SECTION  Xrays were done at Hospital  My independent reading of xrays:  Osteoarthritis medial compartment grade 3/4 inject left knee    Encounter Diagnosis  Name Primary?  . Acute pain of left knee Yes    PLAN: (Rx., injectx, surgery, frx, mri/ct) Come back in 2 weeks if no improvement, Probably would get MRI   Procedure note left knee injection verbal consent was obtained to inject left knee joint  Timeout was completed to confirm the site of injection  The medications used were 40 mg of Depo-Medrol and 1% lidocaine 3 cc  Anesthesia was provided by ethyl chloride and the skin was prepped with alcohol.  After cleaning the skin with alcohol a 20-gauge needle was used to inject the left knee joint. There were no complications. A sterile bandage was applied.   No orders of the defined types were placed in this encounter.   Arther Abbott, MD  10/08/2017 10:36 AM

## 2017-10-25 ENCOUNTER — Ambulatory Visit: Payer: Medicare HMO | Admitting: Orthopedic Surgery

## 2017-10-25 VITALS — BP 122/62 | HR 74 | Ht 64.0 in | Wt 177.0 lb

## 2017-10-25 DIAGNOSIS — M25562 Pain in left knee: Secondary | ICD-10-CM | POA: Diagnosis not present

## 2017-10-25 NOTE — Progress Notes (Signed)
Chief Complaint  Patient presents with  . Follow-up    Recheck on left knee.    Routine follow-up status post injection left knee.  In review  67 year old female fell 8 weeks ago injured her left knee comes in for evaluation  She complains of a dull aching pain of her left leg she says her knee really does not hurt she says she had no knee pain prior to the fall.  Further questioning however reveals that she does have some medial and lateral joint pain swelling for the last 8 weeks associated with the aching of the leg and giving way of the leg  She says she is doing much better with good pain relief from the injection  Physical Exam  Musculoskeletal:       Left knee: She exhibits normal range of motion, no swelling, no effusion, no ecchymosis, no deformity, no laceration, no erythema, no LCL laxity and no MCL laxity. No tenderness found.   Encounter Diagnosis  Name Primary?  . Acute pain of left knee Yes    She is doing well now we released her she will call us if it worsens again

## 2017-11-06 ENCOUNTER — Emergency Department (HOSPITAL_COMMUNITY): Payer: Medicare HMO

## 2017-11-06 ENCOUNTER — Observation Stay (HOSPITAL_COMMUNITY)
Admission: EM | Admit: 2017-11-06 | Discharge: 2017-11-07 | Disposition: A | Discharge status: Home / Self Care | Payer: Medicare HMO | Attending: Internal Medicine | Admitting: Internal Medicine

## 2017-11-06 ENCOUNTER — Other Ambulatory Visit: Payer: Self-pay

## 2017-11-06 ENCOUNTER — Encounter (HOSPITAL_COMMUNITY): Payer: Self-pay | Admitting: Emergency Medicine

## 2017-11-06 DIAGNOSIS — S299XXA Unspecified injury of thorax, initial encounter: Secondary | ICD-10-CM | POA: Diagnosis not present

## 2017-11-06 DIAGNOSIS — R4020 Unspecified coma: Secondary | ICD-10-CM | POA: Diagnosis not present

## 2017-11-06 DIAGNOSIS — R55 Syncope and collapse: Secondary | ICD-10-CM | POA: Insufficient documentation

## 2017-11-06 DIAGNOSIS — R531 Weakness: Secondary | ICD-10-CM

## 2017-11-06 DIAGNOSIS — R918 Other nonspecific abnormal finding of lung field: Secondary | ICD-10-CM | POA: Diagnosis not present

## 2017-11-06 DIAGNOSIS — M545 Low back pain, unspecified: Secondary | ICD-10-CM

## 2017-11-06 DIAGNOSIS — E119 Type 2 diabetes mellitus without complications: Secondary | ICD-10-CM | POA: Diagnosis not present

## 2017-11-06 DIAGNOSIS — J449 Chronic obstructive pulmonary disease, unspecified: Secondary | ICD-10-CM | POA: Diagnosis not present

## 2017-11-06 DIAGNOSIS — R0902 Hypoxemia: Secondary | ICD-10-CM | POA: Insufficient documentation

## 2017-11-06 DIAGNOSIS — S199XXA Unspecified injury of neck, initial encounter: Secondary | ICD-10-CM | POA: Diagnosis not present

## 2017-11-06 DIAGNOSIS — R05 Cough: Secondary | ICD-10-CM | POA: Insufficient documentation

## 2017-11-06 DIAGNOSIS — J441 Chronic obstructive pulmonary disease with (acute) exacerbation: Secondary | ICD-10-CM | POA: Diagnosis present

## 2017-11-06 DIAGNOSIS — Z79899 Other long term (current) drug therapy: Secondary | ICD-10-CM | POA: Diagnosis not present

## 2017-11-06 DIAGNOSIS — R911 Solitary pulmonary nodule: Secondary | ICD-10-CM

## 2017-11-06 DIAGNOSIS — S3992XA Unspecified injury of lower back, initial encounter: Secondary | ICD-10-CM | POA: Diagnosis not present

## 2017-11-06 DIAGNOSIS — S0990XA Unspecified injury of head, initial encounter: Secondary | ICD-10-CM | POA: Diagnosis not present

## 2017-11-06 DIAGNOSIS — I10 Essential (primary) hypertension: Secondary | ICD-10-CM | POA: Diagnosis not present

## 2017-11-06 DIAGNOSIS — M549 Dorsalgia, unspecified: Secondary | ICD-10-CM | POA: Diagnosis present

## 2017-11-06 LAB — COMPREHENSIVE METABOLIC PANEL
ALBUMIN: 3.4 g/dL — AB (ref 3.5–5.0)
ALT: 11 U/L (ref 0–44)
ANION GAP: 4 — AB (ref 5–15)
AST: 12 U/L — AB (ref 15–41)
Alkaline Phosphatase: 50 U/L (ref 38–126)
BUN: 27 mg/dL — AB (ref 8–23)
CHLORIDE: 103 mmol/L (ref 98–111)
CO2: 30 mmol/L (ref 22–32)
Calcium: 8.3 mg/dL — ABNORMAL LOW (ref 8.9–10.3)
Creatinine, Ser: 1.06 mg/dL — ABNORMAL HIGH (ref 0.44–1.00)
GFR calc Af Amer: >60 mL/min (ref >60)
GFR calc non Af Amer: 54 mL/min — ABNORMAL LOW (ref >60)
GLUCOSE: 139 mg/dL — AB (ref 70–99)
POTASSIUM: 4.9 mmol/L (ref 3.5–5.1)
SODIUM: 137 mmol/L (ref 135–145)
TOTAL PROTEIN: 6 g/dL — AB (ref 6.5–8.1)
Total Bilirubin: 0.5 mg/dL (ref 0.3–1.2)

## 2017-11-06 LAB — URINALYSIS, ROUTINE W REFLEX MICROSCOPIC
Bilirubin Urine: NEGATIVE
Glucose, UA: NEGATIVE mg/dL
Hgb urine dipstick: NEGATIVE
KETONES UR: NEGATIVE mg/dL
NITRITE: POSITIVE — AB
PH: 5 (ref 5.0–8.0)
PROTEIN: NEGATIVE mg/dL
Specific Gravity, Urine: 1.008 (ref 1.005–1.030)

## 2017-11-06 LAB — CBC WITH DIFFERENTIAL/PLATELET
BASOS PCT: 0 %
Basophils Absolute: 0 10*3/uL (ref 0.0–0.1)
EOS ABS: 0.1 10*3/uL (ref 0.0–0.7)
Eosinophils Relative: 1 %
HCT: 39.8 % (ref 36.0–46.0)
Hemoglobin: 12.6 g/dL (ref 12.0–15.0)
Lymphocytes Relative: 28 %
Lymphs Abs: 2.3 10*3/uL (ref 0.7–4.0)
MCH: 29.6 pg (ref 26.0–34.0)
MCHC: 31.7 g/dL (ref 30.0–36.0)
MCV: 93.6 fL (ref 78.0–100.0)
MONO ABS: 0.8 10*3/uL (ref 0.1–1.0)
MONOS PCT: 10 %
NEUTROS ABS: 4.9 10*3/uL (ref 1.7–7.7)
Neutrophils Relative %: 61 %
PLATELETS: 150 10*3/uL (ref 150–400)
RBC: 4.25 MIL/uL (ref 3.87–5.11)
RDW: 14 % (ref 11.5–15.5)
WBC: 8 10*3/uL (ref 4.0–10.5)

## 2017-11-06 LAB — TROPONIN I: Troponin I: <0.03 ng/mL (ref <0.03)

## 2017-11-06 LAB — D-DIMER, QUANTITATIVE: D-Dimer, Quant: 1.5 ug/mL-FEU — ABNORMAL HIGH (ref 0.00–0.50)

## 2017-11-06 LAB — CBG MONITORING, ED: Glucose-Capillary: 131 mg/dL — ABNORMAL HIGH (ref 70–99)

## 2017-11-06 MED ORDER — ACETAMINOPHEN 325 MG PO TABS: 650.0000 mg | ORAL_TABLET | Freq: Four times a day (QID) | ORAL | Status: DC | PRN

## 2017-11-06 MED ORDER — BUSPIRONE HCL 5 MG PO TABS
10.0000 mg | ORAL_TABLET | Freq: Two times a day (BID) | ORAL | Status: DC
  Administered 2017-11-06 – 2017-11-07 (×2): 10 mg via ORAL
  Filled 2017-11-06 (×2): qty 2

## 2017-11-06 MED ORDER — GABAPENTIN 300 MG PO CAPS
300.0000 mg | ORAL_CAPSULE | Freq: Three times a day (TID) | ORAL | Status: DC
  Administered 2017-11-06 – 2017-11-07 (×2): 300 mg via ORAL
  Filled 2017-11-06 (×2): qty 1

## 2017-11-06 MED ORDER — MELOXICAM 7.5 MG PO TABS
15.0000 mg | ORAL_TABLET | Freq: Every day | ORAL | Status: DC
  Administered 2017-11-07: 15 mg via ORAL
  Filled 2017-11-06: qty 2

## 2017-11-06 MED ORDER — IOPAMIDOL (ISOVUE-370) INJECTION 76%
80.0000 mL | Freq: Once | INTRAVENOUS | Status: AC | PRN
  Administered 2017-11-06: 80 mL via INTRAVENOUS

## 2017-11-06 MED ORDER — LINAGLIPTIN 5 MG PO TABS
5.0000 mg | ORAL_TABLET | Freq: Every day | ORAL | Status: DC
  Administered 2017-11-07: 5 mg via ORAL
  Filled 2017-11-06: qty 1

## 2017-11-06 MED ORDER — METFORMIN HCL 500 MG PO TABS
500.0000 mg | ORAL_TABLET | Freq: Two times a day (BID) | ORAL | Status: DC
  Administered 2017-11-07: 500 mg via ORAL
  Filled 2017-11-06: qty 1

## 2017-11-06 MED ORDER — IPRATROPIUM-ALBUTEROL 0.5-2.5 (3) MG/3ML IN SOLN
3.0000 mL | Freq: Once | RESPIRATORY_TRACT | Status: AC
  Administered 2017-11-06: 3 mL via RESPIRATORY_TRACT
  Filled 2017-11-06: qty 3

## 2017-11-06 MED ORDER — ENOXAPARIN SODIUM 40 MG/0.4ML ~~LOC~~ SOLN
40.0000 mg | SUBCUTANEOUS | Status: DC
  Administered 2017-11-06: 40 mg via SUBCUTANEOUS
  Filled 2017-11-06: qty 0.4

## 2017-11-06 MED ORDER — ENALAPRIL MALEATE 5 MG PO TABS
20.0000 mg | ORAL_TABLET | Freq: Every day | ORAL | Status: DC
  Administered 2017-11-07: 20 mg via ORAL
  Filled 2017-11-06: qty 4

## 2017-11-06 MED ORDER — PRAVASTATIN SODIUM 40 MG PO TABS
40.0000 mg | ORAL_TABLET | Freq: Every day | ORAL | Status: DC
  Administered 2017-11-06: 40 mg via ORAL
  Filled 2017-11-06: qty 1

## 2017-11-06 MED ORDER — IPRATROPIUM-ALBUTEROL 0.5-2.5 (3) MG/3ML IN SOLN
3.0000 mL | Freq: Four times a day (QID) | RESPIRATORY_TRACT | Status: DC
  Administered 2017-11-07 (×2): 3 mL via RESPIRATORY_TRACT
  Filled 2017-11-06 (×3): qty 3

## 2017-11-06 MED ORDER — TRAMADOL HCL 50 MG PO TABS
50.0000 mg | ORAL_TABLET | Freq: Three times a day (TID) | ORAL | Status: DC | PRN
  Administered 2017-11-06 – 2017-11-07 (×2): 50 mg via ORAL
  Filled 2017-11-06 (×2): qty 1

## 2017-11-06 MED ORDER — SITAGLIPTIN PHOS-METFORMIN HCL 50-500 MG PO TABS: 1.0000 | ORAL_TABLET | Freq: Two times a day (BID) | ORAL | Status: DC

## 2017-11-06 MED ORDER — SODIUM CHLORIDE 0.9 % IV SOLN
INTRAVENOUS | Status: DC
  Administered 2017-11-06: 16:00:00 via INTRAVENOUS

## 2017-11-06 MED ORDER — NICOTINE 14 MG/24HR TD PT24
14.0000 mg | MEDICATED_PATCH | Freq: Every day | TRANSDERMAL | Status: DC
  Administered 2017-11-06 – 2017-11-07 (×2): 14 mg via TRANSDERMAL
  Filled 2017-11-06 (×2): qty 1

## 2017-11-06 MED ORDER — ONDANSETRON HCL 4 MG PO TABS: 4.0000 mg | ORAL_TABLET | Freq: Four times a day (QID) | ORAL | Status: DC | PRN

## 2017-11-06 MED ORDER — PREDNISONE 50 MG PO TABS
60.0000 mg | ORAL_TABLET | Freq: Once | ORAL | Status: AC
  Administered 2017-11-06: 60 mg via ORAL
  Filled 2017-11-06: qty 1

## 2017-11-06 MED ORDER — FENTANYL CITRATE (PF) 100 MCG/2ML IJ SOLN
75.0000 ug | Freq: Once | INTRAMUSCULAR | Status: AC
  Administered 2017-11-06: 75 ug via INTRAVENOUS
  Filled 2017-11-06: qty 2

## 2017-11-06 MED ORDER — SODIUM CHLORIDE 0.9 % IV BOLUS
1000.0000 mL | Freq: Once | INTRAVENOUS | Status: AC
  Administered 2017-11-06: 1000 mL via INTRAVENOUS

## 2017-11-06 MED ORDER — TIZANIDINE HCL 4 MG PO TABS
4.0000 mg | ORAL_TABLET | Freq: Two times a day (BID) | ORAL | Status: DC
  Administered 2017-11-06: 4 mg via ORAL
  Filled 2017-11-06: qty 1

## 2017-11-06 MED ORDER — ACETAMINOPHEN 650 MG RE SUPP: 650.0000 mg | Freq: Four times a day (QID) | RECTAL | Status: DC | PRN

## 2017-11-06 MED ORDER — ONDANSETRON HCL 4 MG/2ML IJ SOLN: 4.0000 mg | Freq: Four times a day (QID) | INTRAMUSCULAR | Status: DC | PRN

## 2017-11-06 MED ORDER — PREDNISONE 20 MG PO TABS
40.0000 mg | ORAL_TABLET | Freq: Every day | ORAL | Status: DC
  Administered 2017-11-07: 40 mg via ORAL
  Filled 2017-11-06: qty 2

## 2017-11-06 MED ORDER — CITALOPRAM HYDROBROMIDE 20 MG PO TABS
20.0000 mg | ORAL_TABLET | Freq: Every day | ORAL | Status: DC
  Administered 2017-11-07: 20 mg via ORAL
  Filled 2017-11-06: qty 1

## 2017-11-06 NOTE — ED Notes (Signed)
Called  to give report, nurse not available.

## 2017-11-06 NOTE — ED Provider Notes (Signed)
[facility] EMERGENCY DEPARTMENT Provider Note   CSN: [id number] Arrival date & time: 11/06/17  1437     History   Chief Complaint Chief Complaint  Patient presents with  . Fall    HPI [name] is a 67 y.o. female.  HPI Patient presents to the emergency room for evaluation of weakness followed by a syncopal episode last evening.  Patient is now having pain in her upper back across her shoulders as well as his her lower back and legs.  Patient feels weak when she tries to stand.  Patient states for a few days she had generalized malaise and generalized weakness but it was not severe.  She was at work last evening when she had a syncopal episode.  Patient states she does not know what particularly triggered it.  Is not sure how long she was unconscious for.  She was able to get up.  Her husband took her home from work.  She was able to walk into her home. This morning when she woke up she had pain in her upper back as well as her lower back.  When she tries to stand she feels weak.  She denies any trouble with fevers or chills.  No chest pain or shortness of breath.  She has an occasional mild cough but she does smoke.  She denies any abdominal pain.  She denies any bleeding.  She denies any numbness.  Past Medical History:  Diagnosis Date  . Arthritis    "hands, knees, feet"  . Asthma   . Colon polyps   . COPD (chronic obstructive pulmonary disease) ([place])   . Hyperlipidemia   . Hypertension   . Peripheral edema   . Pneumonia 08/2010; 11/2010  . SBO (small bowel obstruction) ([place]) 08/25/11   recurrent  . Type II diabetes mellitus [facility])     Patient Active Problem List   Diagnosis Date Noted  . History of colonic polyps 07/25/2015  . FH: colon cancer 07/25/2015  . Incarcerated ventral hernia 04/27/2013  . SBO (small bowel obstruction) ([place]) 08/25/2011  . Hypoxia 08/25/2011  . Leucocytosis 08/25/2011  . HTN (hypertension) 08/25/2011  . COPD (chronic obstructive pulmonary  disease) ([place]) 08/25/2011  . TRIGGER FINGER 06/11/2009  . KNEE, ARTHRITIS, DEGEN./OSTEO 09/08/2007  . TIBIALIS TENDINITIS 02/23/2007  . DIABETES 12/07/2006    Past Surgical History:  Procedure Laterality Date  . APPENDECTOMY    . CHOLECYSTECTOMY  1979  . COLONOSCOPY  2008   Dr. Oneida Alar: 40mm sessile cecal polyp and 1mm sessile transverse colon polyp, torturous sigmoid colon, fragments of tubular adenomas on path   . COLONOSCOPY WITH PROPOFOL N/A 08/13/2015   Procedure: COLONOSCOPY WITH PROPOFOL;  Surgeon: Danie Binder, MD;  Location: [facility] ENDO SUITE;  Service: Endoscopy;  Laterality: N/A;  7829  . ESOPHAGEAL DILATION     remote past by Dr. Tamala Julian  . HERNIA REPAIR    . KNEE CARTILAGE SURGERY  07/2003   right/E-chart  . LAPAROSCOPIC INCISIONAL / UMBILICAL / VENTRAL HERNIA REPAIR  ? til 2002   "~ 13 times"  . POLYPECTOMY  08/13/2015   Procedure: POLYPECTOMY;  Surgeon: Danie Binder, MD;  Location: [facility] ENDO SUITE;  Service: Endoscopy;;  ascending colon polyp, hepatic flexure polyp, transverse colon polyp, descending colon polyp, sigmoid colon polyp, rectal polyps times 3  . SMALL INTESTINE SURGERY     Feb 2015 at [facility] and then remote past   . [place]  History   None      Home Medications    Prior to Admission medications   Medication Sig Start Date End Date Taking? Authorizing Provider  albuterol (PROVENTIL HFA;VENTOLIN HFA) 108 (90 BASE) MCG/ACT inhaler Inhale 2 puffs into the lungs every 4 (four) hours as needed. For shortness of breath   Yes [provider]  busPIRone (BUSPAR) 10 MG tablet Take 10 mg by mouth 2 (two) times daily.  05/31/15  Yes [provider]  citalopram (CELEXA) 20 MG tablet Take 20 mg by mouth daily. 11/04/17  Yes [provider]  enalapril (VASOTEC) 20 MG tablet Take 20 mg by mouth daily.   Yes [provider]  gabapentin (NEURONTIN) 300 MG capsule Take 300 mg by mouth 3 (three) times daily.  02/03/14   Yes [provider]  lovastatin (MEVACOR) 40 MG tablet Take 40 mg by mouth at bedtime.   Yes [provider]  meloxicam (MOBIC) 15 MG tablet Take 15 mg by mouth daily. 10/12/17  Yes [provider]  sitaGLIPtin-metformin (JANUMET) 50-500 MG tablet Take 1 tablet by mouth 2 (two) times daily.    Yes [provider]  tiZANidine (ZANAFLEX) 4 MG tablet Take 4 mg by mouth 2 (two) times daily.    Yes [provider]  traMADol (ULTRAM) 50 MG tablet Take 50 mg by mouth every 8 (eight) hours as needed for moderate pain.   Yes [provider]  HYDROcodone-acetaminophen (NORCO/VICODIN) 5-325 MG tablet Take 1 tablet by mouth every 6 (six) hours as needed for moderate pain. Patient not taking: Reported on 11/06/2017 08/23/17   Isaac Bliss, Rayford Halsted, MD    Family History Family History  Problem Relation Age of Onset  . Colon cancer Mother 62    Social History Social History   Tobacco Use  . Smoking status: Former Smoker    Packs/day: 1.00    Years: 42.00    Pack years: 42.00    Types: Cigarettes  . Smokeless tobacco: Former Systems developer    Quit date: 10/30/2014  Substance Use Topics  . Alcohol use: No    Alcohol/week: 0.0 oz  . Drug use: No     Allergies   Hydrocodone-acetaminophen; Penicillins; and Vicodin [hydrocodone-acetaminophen]   Review of Systems Review of Systems  All other systems reviewed and are negative.    Physical Exam Updated Vital Signs BP 140/68   Pulse 82   Temp 98.7 F (37.1 C) (Oral)   Resp 18   Ht 1.626 m (5\' 4" )   Wt 80.3 kg (177 lb)   SpO2 96%   BMI 30.38 kg/m   Physical Exam  Constitutional: She appears well-developed and well-nourished. No distress.  HENT:  Head: Normocephalic and atraumatic.  Right Ear: External ear normal.  Left Ear: External ear normal.  Eyes: Conjunctivae are normal. Right eye exhibits no discharge. Left eye exhibits no discharge. No scleral icterus.  Neck: Neck supple. No  tracheal deviation present.  Cardiovascular: Normal rate, regular rhythm and intact distal pulses.  Pulmonary/Chest: Effort normal and breath sounds normal. No stridor. No respiratory distress. She has no wheezes. She has no rales.  Abdominal: Soft. Bowel sounds are normal. She exhibits no distension. There is no tenderness. There is no rebound and no guarding.  Musculoskeletal: She exhibits no edema.       Right shoulder: She exhibits no tenderness, no bony tenderness and no swelling.       Left shoulder: She exhibits no tenderness, no bony tenderness  and no swelling.       Right wrist: She exhibits no tenderness, no bony tenderness and no swelling.       Left wrist: She exhibits no tenderness, no bony tenderness and no swelling.       Right hip: She exhibits normal range of motion, no tenderness, no bony tenderness and no swelling.       Left hip: She exhibits normal range of motion, no tenderness and no bony tenderness.       Right ankle: She exhibits no swelling. No tenderness.       Left ankle: She exhibits no swelling. No tenderness.       Cervical back: She exhibits tenderness and bony tenderness. She exhibits no swelling.       Thoracic back: She exhibits tenderness and bony tenderness. She exhibits no swelling.       Lumbar back: She exhibits tenderness and bony tenderness. She exhibits no swelling.  Neurological: She is alert. No cranial nerve deficit (no facial droop, extraocular movements intact, no slurred speech) or sensory deficit. She exhibits normal muscle tone. She displays no seizure activity. Coordination normal.  Patient is able to lift her legs off of the bed but with some difficulty, she has normal grip strength bilaterally, she has normal sensation throughout  Skin: Skin is warm and dry. No rash noted.  Psychiatric: She has a normal mood and affect.  Nursing note and vitals reviewed.    ED Treatments / Results  Labs (all labs ordered are listed, but only abnormal  results are displayed) Labs Reviewed  COMPREHENSIVE METABOLIC PANEL - Abnormal; Notable for the following components:      Result Value   Glucose, Bld 139 (*)    BUN 27 (*)    Creatinine, Ser 1.06 (*)    Calcium 8.3 (*)    Total Protein 6.0 (*)    Albumin 3.4 (*)    AST 12 (*)    GFR calc non Af Amer 54 (*)    Anion gap 4 (*)    All other components within normal limits  URINALYSIS, ROUTINE W REFLEX MICROSCOPIC - Abnormal; Notable for the following components:   APPearance HAZY (*)    Nitrite POSITIVE (*)    Leukocytes, UA SMALL (*)    Bacteria, UA FEW (*)    All other components within normal limits  D-DIMER, QUANTITATIVE (NOT AT [facility]) - Abnormal; Notable for the following components:   D-Dimer, Quant 1.50 (*)    All other components within normal limits  CBG MONITORING, ED - Abnormal; Notable for the following components:   Glucose-Capillary 131 (*)    All other components within normal limits  CBC WITH DIFFERENTIAL/PLATELET  TROPONIN I    EKG EKG Interpretation  Date/Time:  Saturday November 06 2017 14:49:27 EDT Ventricular Rate:  78 PR Interval:    QRS Duration: 81 QT Interval:  377 QTC Calculation: 430 R Axis:   96 Text Interpretation:  Sinus rhythm Right axis deviation Low voltage, extremity and precordial leads No significant change since last tracing Confirmed by Dorie Rank [phone]) on 11/06/2017 3:21:37 PM   Radiology Dg Chest 1 View  Result Date: 11/06/2017 CLINICAL DATA:  Passed out and fell onto back at work today. EXAM: CHEST  1 VIEW COMPARISON:  10/01/2017 FINDINGS: Lungs are adequately inflated without consolidation, effusion or pneumothorax. Borderline cardiomegaly. No evidence of fracture. Mild degenerative change of the spine. IMPRESSION: No acute findings. Electronically Signed   By: Marin Olp M.D.  On: 11/06/2017 16:11   Dg Thoracic Spine 2 View  Result Date: 11/06/2017 CLINICAL DATA:  Fall at work today landing on back. EXAM: THORACIC SPINE 2  VIEWS COMPARISON:  Chest x-ray 10/01/2017 FINDINGS: Vertebral body alignment is within normal. There is mild spondylosis throughout the thoracic spine. Pedicles are intact. There is a mild compression deformity of T11 unchanged. Remainder of the exam is unchanged. IMPRESSION: No acute findings. Mild spondylosis of the thoracic spine with mild stable compression deformity of T11. Electronically Signed   By: Marin Olp M.D.   On: 11/06/2017 16:14   Dg Lumbar Spine Complete  Result Date: 11/06/2017 CLINICAL DATA:  Fall at work today landing on back. EXAM: LUMBAR SPINE - COMPLETE 4+ VIEW COMPARISON:  10/01/2017 and CT 08/21/2017 FINDINGS: Vertebral body alignment is within normal. There is mild spondylosis throughout the lumbar spine to include facet arthropathy. No evidence of acute compression fracture or subluxation. Disc spaces maintained. Calcified plaque over the abdominal aorta. IMPRESSION: No acute findings. Mild spondylosis throughout the lumbar spine. Electronically Signed   By: Marin Olp M.D.   On: 11/06/2017 16:16   Ct Head Wo Contrast  Result Date: 11/06/2017 CLINICAL DATA:  Per ED notes: Pt states feeling weak and loss consciousness last night at work. Pt hit the back of her head and now c/o of lower back, bilateral legs and shoulder pain. Pt states she has felt weak x 4 days. EXAM: CT HEAD WITHOUT CONTRAST CT CERVICAL SPINE WITHOUT CONTRAST TECHNIQUE: Multidetector CT imaging of the head and cervical spine was performed following the standard protocol without intravenous contrast. Multiplanar CT image reconstructions of the cervical spine were also generated. COMPARISON:  PET-CT 02/12/2009 FINDINGS: CT HEAD FINDINGS Brain: No intracranial hemorrhage. No parenchymal contusion. No midline shift or mass effect. Basilar cisterns are patent. No skull base fracture. No fluid in the paranasal sinuses or mastoid air cells. Orbits are normal. Vascular: No hyperdense vessel or unexpected  calcification. Skull: Normal. Negative for fracture or focal lesion. Sinuses/Orbits: No acute finding. Other: None. CT CERVICAL SPINE FINDINGS Alignment: Normal alignment of the cervical vertebral bodies. Skull base and vertebrae: Normal craniocervical junction. No loss of vertebral body height or disc height. Normal facet articulation. No evidence of fracture. Soft tissues and spinal canal: No prevertebral soft tissue swelling. No perispinal or epidural hematoma. Disc levels:  Unremarkable Upper chest: Clear Other: None IMPRESSION: 1. No intracranial trauma. 2. No cervical spine fracture. Electronically Signed   By: Suzy Bouchard M.D.   On: 11/06/2017 16:01   Ct Angio Chest Pe W And/or Wo Contrast  Result Date: 11/06/2017 CLINICAL DATA:  Weakness and recent loss of consciousness. EXAM: CT ANGIOGRAPHY CHEST WITH CONTRAST TECHNIQUE: Multidetector CT imaging of the chest was performed using the standard protocol during bolus administration of intravenous contrast. Multiplanar CT image reconstructions and MIPs were obtained to evaluate the vascular anatomy. CONTRAST:  6mL ISOVUE-370 IOPAMIDOL (ISOVUE-370) INJECTION 76% COMPARISON:  Chest radiograph 11/06/2017 CT abdomen pelvis 08/21/2017 FINDINGS: Cardiovascular: --Pulmonary arteries: Contrast injection is sufficient to demonstrate satisfactory opacification of the pulmonary arteries to the segmental level. There is no pulmonary embolus. The main pulmonary artery is within normal limits for size. --Aorta: Satisfactory opacification of the thoracic aorta. No aortic dissection or other acute aortic syndrome. Conventional 3 vessel aortic branching pattern. The aortic course and caliber are normal. There is mild aortic atherosclerosis. --Heart: Normal size. No pericardial effusion. Mediastinum/Nodes: No mediastinal, hilar or axillary lymphadenopathy. The visualized thyroid and thoracic esophageal course are  unremarkable. Lungs/Pleura: 11 x 7 mm right lower lobe  pulmonary nodule. No pleural effusion. No focal consolidation. No pulmonary infarction. Upper Abdomen: Contrast bolus timing is not optimized for evaluation of the abdominal organs. Bilateral perinephric edema. This is unchanged from the CT abdomen pelvis 08/21/2017. Musculoskeletal: Thoracic spine mild degenerative change. Review of the MIP images confirms the above findings. IMPRESSION: 1. No pulmonary embolus or acute aortic syndrome. 2.  Aortic Atherosclerosis (ICD10-I70.0). 3. 11 x 7 mm right lower lobe pulmonary nodule. Consider one of the following in 3 months for both low-risk and high-risk individuals: (a) repeat chest CT, (b) follow-up PET-CT, or (c) tissue sampling. This recommendation follows the consensus statement: Guidelines for Management of Incidental Pulmonary Nodules Detected on CT Images: From the Fleischner Society 2017; Radiology 2017; 284:228-243. Electronically Signed   By: Ulyses Jarred M.D.   On: 11/06/2017 18:08   Ct Cervical Spine Wo Contrast  Result Date: 11/06/2017 CLINICAL DATA:  Per ED notes: Pt states feeling weak and loss consciousness last night at work. Pt hit the back of her head and now c/o of lower back, bilateral legs and shoulder pain. Pt states she has felt weak x 4 days. EXAM: CT HEAD WITHOUT CONTRAST CT CERVICAL SPINE WITHOUT CONTRAST TECHNIQUE: Multidetector CT imaging of the head and cervical spine was performed following the standard protocol without intravenous contrast. Multiplanar CT image reconstructions of the cervical spine were also generated. COMPARISON:  PET-CT 02/12/2009 FINDINGS: CT HEAD FINDINGS Brain: No intracranial hemorrhage. No parenchymal contusion. No midline shift or mass effect. Basilar cisterns are patent. No skull base fracture. No fluid in the paranasal sinuses or mastoid air cells. Orbits are normal. Vascular: No hyperdense vessel or unexpected calcification. Skull: Normal. Negative for fracture or focal lesion. Sinuses/Orbits: No acute  finding. Other: None. CT CERVICAL SPINE FINDINGS Alignment: Normal alignment of the cervical vertebral bodies. Skull base and vertebrae: Normal craniocervical junction. No loss of vertebral body height or disc height. Normal facet articulation. No evidence of fracture. Soft tissues and spinal canal: No prevertebral soft tissue swelling. No perispinal or epidural hematoma. Disc levels:  Unremarkable Upper chest: Clear Other: None IMPRESSION: 1. No intracranial trauma. 2. No cervical spine fracture. Electronically Signed   By: Suzy Bouchard M.D.   On: 11/06/2017 16:01    Procedures Procedures (including critical care time)  Medications Ordered in ED Medications  sodium chloride 0.9 % bolus 1,000 mL (0 mLs Intravenous Stopped 11/06/17 1636)    And  0.9 %  sodium chloride infusion ( Intravenous Stopped 11/06/17 1836)  predniSONE (DELTASONE) tablet 60 mg (has no administration in time range)  fentaNYL (SUBLIMAZE) injection 75 mcg (75 mcg Intravenous Given 11/06/17 1700)  iopamidol (ISOVUE-370) 76 % injection 80 mL (80 mLs Intravenous Contrast Given 11/06/17 1710)  ipratropium-albuterol (DUONEB) 0.5-2.5 (3) MG/3ML nebulizer solution 3 mL (3 mLs Nebulization Given 11/06/17 1834)     Initial Impression / Assessment and Plan / ED Course  I have reviewed the triage vital signs and the nursing notes.  Pertinent labs & imaging results that were available during my care of the patient were reviewed by me and considered in my medical decision making (see chart for details).  Clinical Course as of Nov 06 1844  Sat Nov 06, 2017  1843 Patient is asymptomatic.  I doubt UTI  Urinalysis, Routine w reflex microscopic(!) [JK]  1843 No clinically significant abnormalities  Comprehensive metabolic panel(!) [JK]  0109 CT scan ordered because of the elevated d-dimer  D-dimer, quantitative (  not at [facility])(!) [JK]  1843 No significant injuries noted on x-rays.  No PE on CT scan.  Incidental pulmonary nodule   [JK]    1844 Patient's oxygen saturations dropped into the mid 60s when she attempted to ambulate.   [JK]    Clinical Course User Index [JK] Dorie Rank, MD    She presented to the emergency room for evaluation after a syncopal episode yesterday at work.  Patient denies any significant prodrome.  She came to the emergency room with pain associated with her fall.  ED work-up did not show any signs of any significant injuries.  Was concerned about possibility of a pulmonary embolism considering her hypoxia.  CT scan was negative for PE, aneurysm or other acute abnormality.  I suspect patient's decreased oxygen saturation is related to her smoking history.  However, when the patient attempted to ambulate off room air her oxygen saturation dropped into the mid 80s.  I have ordered a breathing treatment and steroids.  I will start the patient on supplemental oxygen.  I will consult with the medical service for admission.    Final Clinical Impressions(s) / ED Diagnoses   Final diagnoses:  Chronic obstructive pulmonary disease, unspecified COPD type ([place])  Hypoxia  Syncope, unspecified syncope type  Acute midline low back pain without sciatica      Dorie Rank, MD 11/06/17 1846

## 2017-11-06 NOTE — ED Triage Notes (Addendum)
Pt states feeling weak and loss consciousness last night at work.  Pt hit the back of her head and now c/o of lower back, bilateral legs and shoulder pain. Pt states she has felt weak x 4 days.

## 2017-11-06 NOTE — H&P (Signed)
History and Physical  [name] YOV:[id number] DOB: May 02, 1950 DOA: 11/06/2017  Referring physician: Dr Tomi Bamberger, ED physician PCP: Redmond School, MD  Outpatient Specialists:  Patient Coming From: Home  Chief Complaint: Back pain  HPI: [name] is a 67 y.o. female with a history of diabetes, hypertension, COPD, hyperlipidemia.  Patient had a syncopal episode yesterday when she fell she hit her lower and upper back on a shelf.  She was only out for a brief period of time and recovered spontaneously.  She went home with her husband and had an uneventful night.  Today, the patient was brought to the emergency department due to pain from hitting her back.  It is nonradiating.  Movement increases pain.  While here, she was noted to be hypoxic, especially when she ambulated.  Admitting O2 sats were 84%.  Denies cough, shortness of breath.  Denies frequency, hesitancy, dysuria.  Emergency Department Course: Received 60 mg of prednisone.  Chest x-ray shows no infiltrate.  White count was normal.  CTA shows small 11 x 7 mm right lower lobe pulmonary nodule.  Review of Systems:   Pt denies any fevers, chills, nausea, vomiting, diarrhea, constipation, abdominal pain, orthopnea, cough, wheezing, palpitations, headache, vision changes, lightheadedness, dizziness, melena, rectal bleeding.  Review of systems are otherwise negative  Past Medical History:  Diagnosis Date  . Arthritis    "hands, knees, feet"  . Asthma   . Colon polyps   . COPD (chronic obstructive pulmonary disease) ([place])   . Hyperlipidemia   . Hypertension   . Peripheral edema   . Pneumonia 08/2010; 11/2010  . SBO (small bowel obstruction) ([place]) 08/25/11   recurrent  . Type II diabetes mellitus ([place])    Past Surgical History:  Procedure Laterality Date  . APPENDECTOMY    . CHOLECYSTECTOMY  1979  . COLONOSCOPY  2008   Dr. Oneida Alar: 63mm sessile cecal polyp and 22mm sessile transverse colon polyp, torturous sigmoid colon,  fragments of tubular adenomas on path   . COLONOSCOPY WITH PROPOFOL N/A 08/13/2015   Procedure: COLONOSCOPY WITH PROPOFOL;  Surgeon: Danie Binder, MD;  Location: [facility];  Service: Endoscopy;  Laterality: N/A;  [id number]  . ESOPHAGEAL DILATION     remote past by Dr. Tamala Julian  . HERNIA REPAIR    . KNEE CARTILAGE SURGERY  07/2003   right/E-chart  . LAPAROSCOPIC INCISIONAL / UMBILICAL / VENTRAL HERNIA REPAIR  ? til 2002   "~ 13 times"  . POLYPECTOMY  08/13/2015   Procedure: POLYPECTOMY;  Surgeon: Danie Binder, MD;  Location: [facility];  Service: Endoscopy;;  ascending colon polyp, hepatic flexure polyp, transverse colon polyp, descending colon polyp, sigmoid colon polyp, rectal polyps times 3  . SMALL INTESTINE SURGERY     Feb 2015 at [facility] and then remote past   . [place]   Social History:  reports that she has quit smoking. Her smoking use included cigarettes. She has a 42.00 pack-year smoking history. She quit smokeless tobacco use about 3 years ago. She reports that she does not drink alcohol or use drugs. Patient lives at home  Allergies  Allergen Reactions  . Hydrocodone-Acetaminophen Other (See Comments)    itching  . Penicillins Hives and Itching    Has patient had a PCN reaction causing immediate rash, facial/tongue/throat swelling, SOB or lightheadedness with hypotension: Yes Has patient had a PCN reaction causing severe rash involving mucus membranes or skin necrosis: No Has patient had a  PCN reaction that required hospitalization No Has patient had a PCN reaction occurring within the last 10 years: No If all of the above answers are "NO", then may proceed with Cephalosporin use.  Has patient had a PCN reaction causing immediate rash, facial/tongue/throat swelling, SOB or lightheadedness with hypotension: Yes Has patient had a PCN reaction causing severe rash involving mucus membranes or skin necrosis: No Has patient had a PCN reaction that required  hospitalization No Has patient had a PCN reaction occurring within the last 10 years: No If all of the above answers are "NO", then may proceed with Cephalosporin use. Can tolerate cephalosporins, per pt Electronically signed by: Rudene Christians, [facility] 06/07/2013 10:43 AM  . Vicodin [Hydrocodone-Acetaminophen]     itching    Family History  Problem Relation Age of Onset  . Colon cancer Mother 54      Prior to Admission medications   Medication Sig Start Date End Date Taking? Authorizing Provider  albuterol (PROVENTIL HFA;VENTOLIN HFA) 108 (90 BASE) MCG/ACT inhaler Inhale 2 puffs into the lungs every 4 (four) hours as needed. For shortness of breath   Yes [provider]  busPIRone (BUSPAR) 10 MG tablet Take 10 mg by mouth 2 (two) times daily.  05/31/15  Yes [provider]  citalopram (CELEXA) 20 MG tablet Take 20 mg by mouth daily. 11/04/17  Yes [provider]  enalapril (VASOTEC) 20 MG tablet Take 20 mg by mouth daily.   Yes [provider]  gabapentin (NEURONTIN) 300 MG capsule Take 300 mg by mouth 3 (three) times daily.  02/03/14  Yes [provider]  lovastatin (MEVACOR) 40 MG tablet Take 40 mg by mouth at bedtime.   Yes [provider]  meloxicam (MOBIC) 15 MG tablet Take 15 mg by mouth daily. 10/12/17  Yes [provider]  sitaGLIPtin-metformin (JANUMET) 50-500 MG tablet Take 1 tablet by mouth 2 (two) times daily.    Yes [provider]  tiZANidine (ZANAFLEX) 4 MG tablet Take 4 mg by mouth 2 (two) times daily.    Yes [provider]  traMADol (ULTRAM) 50 MG tablet Take 50 mg by mouth every 8 (eight) hours as needed for moderate pain.   Yes [provider]  HYDROcodone-acetaminophen (NORCO/VICODIN) 5-325 MG tablet Take 1 tablet by mouth every 6 (six) hours as needed for moderate pain. Patient not taking: Reported on 11/06/2017 08/23/17   Isaac Bliss, Rayford Halsted, MD    Physical Exam: BP (!)  151/66   Pulse 82   Temp 98.7 F (37.1 C) (Oral)   Resp 20   Ht 5\' 4"  (1.626 m)   Wt 80.3 kg (177 lb)   SpO2 91%   BMI 30.38 kg/m   . General: Elderly Caucasian female. Awake and alert and oriented x3. No acute cardiopulmonary distress.  Marland Kitchen HEENT: Normocephalic atraumatic.  Right and left ears normal in appearance.  Pupils equal, round, reactive to light. Extraocular muscles are intact. Sclerae anicteric and noninjected.  Moist mucosal membranes. No mucosal lesions.  . Neck: Neck supple without lymphadenopathy. No carotid bruits. No masses palpated.  . Cardiovascular: Regular rate with normal S1-S2 sounds. No murmurs, rubs, gallops auscultated. No JVD.  Marland Kitchen Respiratory: Diminished breath sounds.  Prolonged exhalation phase.  Minimal air movement.  No accessory muscle use. . Abdomen: Soft, nontender, nondistended. Active bowel sounds. No masses or hepatosplenomegaly  . Skin: No rashes, lesions, or ulcerations.  Dry, warm to touch. 2+ dorsalis pedis and radial pulses. . Musculoskeletal: No  calf or leg pain. All major joints not erythematous nontender.  No upper or lower joint deformation.  Good ROM.  No contractures  . Psychiatric: Intact judgment and insight. Pleasant and cooperative. . Neurologic: No focal neurological deficits. Strength is 5/5 and symmetric in upper and lower extremities.  Cranial nerves II through XII are grossly intact.           Labs on Admission: I have personally reviewed following labs and imaging studies  CBC: Recent Labs  Lab 11/06/17 1537  WBC 8.0  NEUTROABS 4.9  HGB 12.6  HCT 39.8  MCV 93.6  PLT 354   Basic Metabolic Panel: Recent Labs  Lab 11/06/17 1537  NA 137  K 4.9  CL 103  CO2 30  GLUCOSE 139*  BUN 27*  CREATININE 1.06*  CALCIUM 8.3*   GFR: Estimated Creatinine Clearance: 54.9 mL/min (A) (by C-G formula based on SCr of 1.06 mg/dL (H)). Liver Function Tests: Recent Labs  Lab 11/06/17 1537  AST 12*  ALT 11  ALKPHOS 50  BILITOT  0.5  PROT 6.0*  ALBUMIN 3.4*   No results for input(s): LIPASE, AMYLASE in the last 168 hours. No results for input(s): AMMONIA in the last 168 hours. Coagulation Profile: No results for input(s): INR, PROTIME in the last 168 hours. Cardiac Enzymes: Recent Labs  Lab 11/06/17 1537  TROPONINI <0.03   BNP (last 3 results) No results for input(s): PROBNP in the last 8760 hours. HbA1C: No results for input(s): HGBA1C in the last 72 hours. CBG: Recent Labs  Lab 11/06/17 1534  GLUCAP 131*   Lipid Profile: No results for input(s): CHOL, HDL, LDLCALC, TRIG, CHOLHDL, LDLDIRECT in the last 72 hours. Thyroid Function Tests: No results for input(s): TSH, T4TOTAL, FREET4, T3FREE, THYROIDAB in the last 72 hours. Anemia Panel: No results for input(s): VITAMINB12, FOLATE, FERRITIN, TIBC, IRON, RETICCTPCT in the last 72 hours. Urine analysis:    Component Value Date/Time   COLORURINE YELLOW 11/06/2017 1640   APPEARANCEUR HAZY (A) 11/06/2017 1640   LABSPEC 1.008 11/06/2017 1640   PHURINE 5.0 11/06/2017 1640   GLUCOSEU NEGATIVE 11/06/2017 1640   HGBUR NEGATIVE 11/06/2017 1640   BILIRUBINUR NEGATIVE 11/06/2017 1640   KETONESUR NEGATIVE 11/06/2017 1640   PROTEINUR NEGATIVE 11/06/2017 1640   UROBILINOGEN 0.2 01/04/2011 0914   NITRITE POSITIVE (A) 11/06/2017 1640   LEUKOCYTESUR SMALL (A) 11/06/2017 1640   Sepsis Labs: @LABRCNTIP (procalcitonin:4,lacticidven:4) )No results found for this or any previous visit (from the past 240 hour(s)).   Radiological Exams on Admission: Dg Chest 1 View  Result Date: 11/06/2017 CLINICAL DATA:  Passed out and fell onto back at work today. EXAM: CHEST  1 VIEW COMPARISON:  10/01/2017 FINDINGS: Lungs are adequately inflated without consolidation, effusion or pneumothorax. Borderline cardiomegaly. No evidence of fracture. Mild degenerative change of the spine. IMPRESSION: No acute findings. Electronically Signed   By: Marin Olp M.D.   On: 11/06/2017 16:11     Dg Thoracic Spine 2 View  Result Date: 11/06/2017 CLINICAL DATA:  Fall at work today landing on back. EXAM: THORACIC SPINE 2 VIEWS COMPARISON:  Chest x-ray 10/01/2017 FINDINGS: Vertebral body alignment is within normal. There is mild spondylosis throughout the thoracic spine. Pedicles are intact. There is a mild compression deformity of T11 unchanged. Remainder of the exam is unchanged. IMPRESSION: No acute findings. Mild spondylosis of the thoracic spine with mild stable compression deformity of T11. Electronically Signed   By: Marin Olp M.D.   On: 11/06/2017 16:14  Dg Lumbar Spine Complete  Result Date: 11/06/2017 CLINICAL DATA:  Fall at work today landing on back. EXAM: LUMBAR SPINE - COMPLETE 4+ VIEW COMPARISON:  10/01/2017 and CT 08/21/2017 FINDINGS: Vertebral body alignment is within normal. There is mild spondylosis throughout the lumbar spine to include facet arthropathy. No evidence of acute compression fracture or subluxation. Disc spaces maintained. Calcified plaque over the abdominal aorta. IMPRESSION: No acute findings. Mild spondylosis throughout the lumbar spine. Electronically Signed   By: Marin Olp M.D.   On: 11/06/2017 16:16   Ct Head Wo Contrast  Result Date: 11/06/2017 CLINICAL DATA:  Per ED notes: Pt states feeling weak and loss consciousness last night at work. Pt hit the back of her head and now c/o of lower back, bilateral legs and shoulder pain. Pt states she has felt weak x 4 days. EXAM: CT HEAD WITHOUT CONTRAST CT CERVICAL SPINE WITHOUT CONTRAST TECHNIQUE: Multidetector CT imaging of the head and cervical spine was performed following the standard protocol without intravenous contrast. Multiplanar CT image reconstructions of the cervical spine were also generated. COMPARISON:  PET-CT 02/12/2009 FINDINGS: CT HEAD FINDINGS Brain: No intracranial hemorrhage. No parenchymal contusion. No midline shift or mass effect. Basilar cisterns are patent. No skull base fracture.  No fluid in the paranasal sinuses or mastoid air cells. Orbits are normal. Vascular: No hyperdense vessel or unexpected calcification. Skull: Normal. Negative for fracture or focal lesion. Sinuses/Orbits: No acute finding. Other: None. CT CERVICAL SPINE FINDINGS Alignment: Normal alignment of the cervical vertebral bodies. Skull base and vertebrae: Normal craniocervical junction. No loss of vertebral body height or disc height. Normal facet articulation. No evidence of fracture. Soft tissues and spinal canal: No prevertebral soft tissue swelling. No perispinal or epidural hematoma. Disc levels:  Unremarkable Upper chest: Clear Other: None IMPRESSION: 1. No intracranial trauma. 2. No cervical spine fracture. Electronically Signed   By: Suzy Bouchard M.D.   On: 11/06/2017 16:01   Ct Angio Chest Pe W And/or Wo Contrast  Result Date: 11/06/2017 CLINICAL DATA:  Weakness and recent loss of consciousness. EXAM: CT ANGIOGRAPHY CHEST WITH CONTRAST TECHNIQUE: Multidetector CT imaging of the chest was performed using the standard protocol during bolus administration of intravenous contrast. Multiplanar CT image reconstructions and MIPs were obtained to evaluate the vascular anatomy. CONTRAST:  4mL ISOVUE-370 IOPAMIDOL (ISOVUE-370) INJECTION 76% COMPARISON:  Chest radiograph 11/06/2017 CT abdomen pelvis 08/21/2017 FINDINGS: Cardiovascular: --Pulmonary arteries: Contrast injection is sufficient to demonstrate satisfactory opacification of the pulmonary arteries to the segmental level. There is no pulmonary embolus. The main pulmonary artery is within normal limits for size. --Aorta: Satisfactory opacification of the thoracic aorta. No aortic dissection or other acute aortic syndrome. Conventional 3 vessel aortic branching pattern. The aortic course and caliber are normal. There is mild aortic atherosclerosis. --Heart: Normal size. No pericardial effusion. Mediastinum/Nodes: No mediastinal, hilar or axillary  lymphadenopathy. The visualized thyroid and thoracic esophageal course are unremarkable. Lungs/Pleura: 11 x 7 mm right lower lobe pulmonary nodule. No pleural effusion. No focal consolidation. No pulmonary infarction. Upper Abdomen: Contrast bolus timing is not optimized for evaluation of the abdominal organs. Bilateral perinephric edema. This is unchanged from the CT abdomen pelvis 08/21/2017. Musculoskeletal: Thoracic spine mild degenerative change. Review of the MIP images confirms the above findings. IMPRESSION: 1. No pulmonary embolus or acute aortic syndrome. 2.  Aortic Atherosclerosis (ICD10-I70.0). 3. 11 x 7 mm right lower lobe pulmonary nodule. Consider one of the following in 3 months for both low-risk and high-risk individuals: (a)  repeat chest CT, (b) follow-up PET-CT, or (c) tissue sampling. This recommendation follows the consensus statement: Guidelines for Management of Incidental Pulmonary Nodules Detected on CT Images: From the Fleischner Society 2017; Radiology 2017; 284:228-243. Electronically Signed   By: Ulyses Jarred M.D.   On: 11/06/2017 18:08   Ct Cervical Spine Wo Contrast  Result Date: 11/06/2017 CLINICAL DATA:  Per ED notes: Pt states feeling weak and loss consciousness last night at work. Pt hit the back of her head and now c/o of lower back, bilateral legs and shoulder pain. Pt states she has felt weak x 4 days. EXAM: CT HEAD WITHOUT CONTRAST CT CERVICAL SPINE WITHOUT CONTRAST TECHNIQUE: Multidetector CT imaging of the head and cervical spine was performed following the standard protocol without intravenous contrast. Multiplanar CT image reconstructions of the cervical spine were also generated. COMPARISON:  PET-CT 02/12/2009 FINDINGS: CT HEAD FINDINGS Brain: No intracranial hemorrhage. No parenchymal contusion. No midline shift or mass effect. Basilar cisterns are patent. No skull base fracture. No fluid in the paranasal sinuses or mastoid air cells. Orbits are normal. Vascular: No  hyperdense vessel or unexpected calcification. Skull: Normal. Negative for fracture or focal lesion. Sinuses/Orbits: No acute finding. Other: None. CT CERVICAL SPINE FINDINGS Alignment: Normal alignment of the cervical vertebral bodies. Skull base and vertebrae: Normal craniocervical junction. No loss of vertebral body height or disc height. Normal facet articulation. No evidence of fracture. Soft tissues and spinal canal: No prevertebral soft tissue swelling. No perispinal or epidural hematoma. Disc levels:  Unremarkable Upper chest: Clear Other: None IMPRESSION: 1. No intracranial trauma. 2. No cervical spine fracture. Electronically Signed   By: Suzy Bouchard M.D.   On: 11/06/2017 16:01    EKG: Independently reviewed.  Sinus rhythm with right axis deviation.  No acute ST changes  Assessment/Plan: Principal Problem:   Hypoxia Active Problems:   Type 2 diabetes mellitus without complication (HCC)   HTN (hypertension)   COPD exacerbation (HCC)   Back pain   Pulmonary nodule, right    This patient was discussed with the ED physician, including pertinent vitals, physical exam findings, labs, and imaging.  We also discussed care given by the ED provider.  1. Ambulating hypoxia a. Oxygen supplementation 2. COPD exacerbation Antibiotics: none DuoNeb's every 4 scheduled  Prednisone 40 mg daily Mucinex 3. Right pulmonary nodule a. Will need repeat CT in 3 months 4. Type 2 diabetes  a. Continue home medication 5. back pain a. Tramadol 6. Hypertension a. Continue home medication  DVT prophylaxis: Lovenox Consultants: None Code Status: Full code Family Communication: Husband and granddaughter in the room Disposition Plan: Patient to return home following admission   Lashan Macias [place] [facility] Pager [phone]  If 7PM-7AM, please contact night-coverage [email or web address] Password [email or web address]

## 2017-11-06 NOTE — ED Notes (Signed)
PT ambulated to the bathroom at this time and O2 sats dropped to 84% on room air. EDP made aware.

## 2017-11-06 NOTE — ED Notes (Signed)
PT doesn't have a pacemaker.

## 2017-11-06 NOTE — ED Notes (Signed)
Pt was informed that we need a urine sample. Pt stated that she can not urinate at this time.

## 2017-11-07 ENCOUNTER — Encounter (HOSPITAL_COMMUNITY): Payer: Self-pay

## 2017-11-07 ENCOUNTER — Other Ambulatory Visit: Payer: Self-pay

## 2017-11-07 DIAGNOSIS — J441 Chronic obstructive pulmonary disease with (acute) exacerbation: Secondary | ICD-10-CM | POA: Diagnosis not present

## 2017-11-07 DIAGNOSIS — I10 Essential (primary) hypertension: Secondary | ICD-10-CM

## 2017-11-07 DIAGNOSIS — R0902 Hypoxemia: Secondary | ICD-10-CM | POA: Diagnosis not present

## 2017-11-07 MED ORDER — FLUTICASONE-SALMETEROL 250-50 MCG/DOSE IN AEPB: 1.0000 | INHALATION_SPRAY | Freq: Two times a day (BID) | RESPIRATORY_TRACT | 0 refills | Status: AC

## 2017-11-07 MED ORDER — PREDNISONE 20 MG PO TABS: 40.0000 mg | ORAL_TABLET | Freq: Every day | ORAL | 0 refills | Status: DC

## 2017-11-07 MED ORDER — TIOTROPIUM BROMIDE MONOHYDRATE 18 MCG IN CAPS: 18.0000 ug | ORAL_CAPSULE | Freq: Every day | RESPIRATORY_TRACT | 2 refills | Status: AC

## 2017-11-07 NOTE — Care Management Note (Signed)
Case Management Note  Patient Details  Name: [name] MRN: [id number] Date of Birth: October 09, 1950  Subjective/Objective:              Notified by RN that patient will need home oxygen, RN will place O2 qualifying note, MD to place order. Referral placed to [facility], clinical liaison w [facility]. Spoke to patient, she knows that portable tank for transport home today will be brought to her hospital room and concentrator will be delivered to home today.  No further CM needs.      Action/Plan:   Expected Discharge Date:  11/07/17               Expected Discharge Plan:  Home/Self Care  In-House Referral:     Discharge planning Services  CM Consult  Post Acute Care Choice:  Durable Medical Equipment Choice offered to:     DME Arranged:  Oxygen DME Agency:  [place]:    [facility] Agency:     Status of Service:  Completed, signed off  If discussed at [place] of Stay Meetings, dates discussed:    Additional Comments:  Carles Collet, RN 11/07/2017, 11:15 AM

## 2017-11-07 NOTE — Progress Notes (Signed)
Pt noted to desat to 80% on RA while sleeping, and 88% on 2L [place] while sleeping. Currently satting 92% on 3L [place]. Sats fine while awake on RA.

## 2017-11-07 NOTE — Discharge Summary (Addendum)
Physician Discharge Summary  [name] ZOX:[id number] DOB: Jul 01, 1950 DOA: 11/06/2017  PCP: Redmond School, MD  Admit date: 11/06/2017 Discharge date: 11/07/2017  Time spent: >35 minutes  Recommendations for Outpatient Follow-up:  PCP in 3-7 days Pulmonology in 2-4 weeks  Repeat CT chest in 3 month  Discharge Diagnoses:  Principal Problem:   Hypoxia Active Problems:   Type 2 diabetes mellitus without complication (HCC)   HTN (hypertension)   COPD exacerbation (HCC)   Back pain   Pulmonary nodule, right   Discharge Condition: stable   Diet recommendation: carb modified   Filed Weights   11/06/17 1448 11/06/17 2000  Weight: 80.3 kg (177 lb) 84.4 kg (186 lb 1.1 oz)    History of present illness:   67 y.o.femalewith a history of diabetes, hypertension, COPD, tobacco use, chronic pains, hyperlipidemia, admitted wit copd exacerbation Patient had a syncopal episode yesterday when she fell she hit her lower and upper back on a shelf. She was only out for a brief period of time and recovered spontaneously. She went home with her husband and had an uneventful night. Today, the patient was brought to the emergency department due to pain from hitting her back. It is nonradiating. Movement increases pain. While here, she was noted to be hypoxic, especially when she ambulated. Admitting O2 sats were 84%. Denies cough, shortness of breath. Denies frequency, hesitancy, dysuria.  Emergency Department Course: Received 60 mg of prednisone. Chest x-ray shows no infiltrate. White count was normal. CTA shows small 11 x 7 mm right lower lobe pulmonary nodule.    Hospital Course:    COPD exacerbation. NO clear infiltrates on x ray imaging.  Continued to improve with bronchodilators, prednisone. Counseled to stop smoking   Acute hypoxic respiratory failure due to copd/tobacco use. Hypoxia->resolved with low flow oxygen. Cont as above. desat study   Right pulmonary nodule.  D/w patient, recommended to repeat CT in 3 months and f/u with pulmonology   Type 2 diabetes. Continue home medication  Back pain. tramadol  Hypertension. continue home medication  Recent syncope. Suspect due to zanaflex. Neuro exam is non focal. Ct head: no acute findings. Recommended to decrease zanaflex    Abnormal UA. No s/s of UTI. Afebrile     Procedures:  CT (i.e. Studies not automatically included, echos, thoracentesis, etc; not x-rays)  Consultations:  none  Discharge Exam: Vitals:   11/06/17 2312 11/07/17 0524  BP:  139/63  Pulse:  75  Resp:  18  Temp:  98.6 F (37 C)  SpO2: 91% 94%    General: no distress  Cardiovascular: s1,s2 rrr Respiratory: CTA BL  Discharge Instructions  Discharge Instructions    Diet - low sodium heart healthy   Complete by:  As directed    Increase activity slowly   Complete by:  As directed      Allergies as of 11/07/2017      Reactions   Hydrocodone-acetaminophen Other (See Comments)   Itching. Pt states she can tolerate acetaminophen   Penicillins Hives, Itching   Has patient had a PCN reaction causing immediate rash, facial/tongue/throat swelling, SOB or lightheadedness with hypotension: Yes Has patient had a PCN reaction causing severe rash involving mucus membranes or skin necrosis: No Has patient had a PCN reaction that required hospitalization No Has patient had a PCN reaction occurring within the last 10 years: No If all of the above answers are "NO", then may proceed with Cephalosporin use. Has patient had a PCN reaction causing immediate  rash, facial/tongue/throat swelling, SOB or lightheadedness with hypotension: Yes Has patient had a PCN reaction causing severe rash involving mucus membranes or skin necrosis: No Has patient had a PCN reaction that required hospitalization No Has patient had a PCN reaction occurring within the last 10 years: No If all of the above answers are "NO", then may proceed with  Cephalosporin use. Can tolerate cephalosporins, per pt Electronically signed by: Rudene Christians, [facility] 06/07/2013 10:43 AM   Vicodin [hydrocodone-acetaminophen]    itching      Medication List    STOP taking these medications   HYDROcodone-acetaminophen 5-325 MG tablet Commonly known as:  NORCO/VICODIN   tiZANidine 4 MG tablet Commonly known as:  ZANAFLEX     TAKE these medications   albuterol 108 (90 Base) MCG/ACT inhaler Commonly known as:  PROVENTIL HFA;VENTOLIN HFA Inhale 2 puffs into the lungs every 4 (four) hours as needed. For shortness of breath   busPIRone 10 MG tablet Commonly known as:  BUSPAR Take 10 mg by mouth 2 (two) times daily.   citalopram 20 MG tablet Commonly known as:  CELEXA Take 20 mg by mouth daily.   enalapril 20 MG tablet Commonly known as:  VASOTEC Take 20 mg by mouth daily.   Fluticasone-Salmeterol 250-50 MCG/DOSE Aepb Commonly known as:  ADVAIR DISKUS Inhale 1 puff into the lungs 2 (two) times daily.   gabapentin 300 MG capsule Commonly known as:  NEURONTIN Take 300 mg by mouth 3 (three) times daily.   lovastatin 40 MG tablet Commonly known as:  MEVACOR Take 40 mg by mouth at bedtime.   meloxicam 15 MG tablet Commonly known as:  MOBIC Take 15 mg by mouth daily.   predniSONE 20 MG tablet Commonly known as:  DELTASONE Take 2 tablets (40 mg total) by mouth daily with breakfast.   sitaGLIPtin-metformin 50-500 MG tablet Commonly known as:  JANUMET Take 1 tablet by mouth 2 (two) times daily.   tiotropium 18 MCG inhalation capsule Commonly known as:  SPIRIVA HANDIHALER Place 1 capsule (18 mcg total) into inhaler and inhale daily.   traMADol 50 MG tablet Commonly known as:  ULTRAM Take 50 mg by mouth every 8 (eight) hours as needed for moderate pain.      Allergies  Allergen Reactions  . Hydrocodone-Acetaminophen Other (See Comments)    Itching. Pt states she can tolerate acetaminophen  . Penicillins Hives and Itching    Has  patient had a PCN reaction causing immediate rash, facial/tongue/throat swelling, SOB or lightheadedness with hypotension: Yes Has patient had a PCN reaction causing severe rash involving mucus membranes or skin necrosis: No Has patient had a PCN reaction that required hospitalization No Has patient had a PCN reaction occurring within the last 10 years: No If all of the above answers are "NO", then may proceed with Cephalosporin use.  Has patient had a PCN reaction causing immediate rash, facial/tongue/throat swelling, SOB or lightheadedness with hypotension: Yes Has patient had a PCN reaction causing severe rash involving mucus membranes or skin necrosis: No Has patient had a PCN reaction that required hospitalization No Has patient had a PCN reaction occurring within the last 10 years: No If all of the above answers are "NO", then may proceed with Cephalosporin use. Can tolerate cephalosporins, per pt Electronically signed by: Rudene Christians, [facility] 06/07/2013 10:43 AM  . Vicodin [Hydrocodone-Acetaminophen]     itching      The results of significant diagnostics from this hospitalization (including imaging, microbiology, ancillary and laboratory) are  listed below for reference.    Significant Diagnostic Studies: Dg Chest 1 View  Result Date: 11/06/2017 CLINICAL DATA:  Passed out and fell onto back at work today. EXAM: CHEST  1 VIEW COMPARISON:  10/01/2017 FINDINGS: Lungs are adequately inflated without consolidation, effusion or pneumothorax. Borderline cardiomegaly. No evidence of fracture. Mild degenerative change of the spine. IMPRESSION: No acute findings. Electronically Signed   By: Marin Olp M.D.   On: 11/06/2017 16:11   Dg Thoracic Spine 2 View  Result Date: 11/06/2017 CLINICAL DATA:  Fall at work today landing on back. EXAM: THORACIC SPINE 2 VIEWS COMPARISON:  Chest x-ray 10/01/2017 FINDINGS: Vertebral body alignment is within normal. There is mild spondylosis throughout the  thoracic spine. Pedicles are intact. There is a mild compression deformity of T11 unchanged. Remainder of the exam is unchanged. IMPRESSION: No acute findings. Mild spondylosis of the thoracic spine with mild stable compression deformity of T11. Electronically Signed   By: Marin Olp M.D.   On: 11/06/2017 16:14   Dg Lumbar Spine Complete  Result Date: 11/06/2017 CLINICAL DATA:  Fall at work today landing on back. EXAM: LUMBAR SPINE - COMPLETE 4+ VIEW COMPARISON:  10/01/2017 and CT 08/21/2017 FINDINGS: Vertebral body alignment is within normal. There is mild spondylosis throughout the lumbar spine to include facet arthropathy. No evidence of acute compression fracture or subluxation. Disc spaces maintained. Calcified plaque over the abdominal aorta. IMPRESSION: No acute findings. Mild spondylosis throughout the lumbar spine. Electronically Signed   By: Marin Olp M.D.   On: 11/06/2017 16:16   Ct Head Wo Contrast  Result Date: 11/06/2017 CLINICAL DATA:  Per ED notes: Pt states feeling weak and loss consciousness last night at work. Pt hit the back of her head and now c/o of lower back, bilateral legs and shoulder pain. Pt states she has felt weak x 4 days. EXAM: CT HEAD WITHOUT CONTRAST CT CERVICAL SPINE WITHOUT CONTRAST TECHNIQUE: Multidetector CT imaging of the head and cervical spine was performed following the standard protocol without intravenous contrast. Multiplanar CT image reconstructions of the cervical spine were also generated. COMPARISON:  PET-CT 02/12/2009 FINDINGS: CT HEAD FINDINGS Brain: No intracranial hemorrhage. No parenchymal contusion. No midline shift or mass effect. Basilar cisterns are patent. No skull base fracture. No fluid in the paranasal sinuses or mastoid air cells. Orbits are normal. Vascular: No hyperdense vessel or unexpected calcification. Skull: Normal. Negative for fracture or focal lesion. Sinuses/Orbits: No acute finding. Other: None. CT CERVICAL SPINE FINDINGS  Alignment: Normal alignment of the cervical vertebral bodies. Skull base and vertebrae: Normal craniocervical junction. No loss of vertebral body height or disc height. Normal facet articulation. No evidence of fracture. Soft tissues and spinal canal: No prevertebral soft tissue swelling. No perispinal or epidural hematoma. Disc levels:  Unremarkable Upper chest: Clear Other: None IMPRESSION: 1. No intracranial trauma. 2. No cervical spine fracture. Electronically Signed   By: Suzy Bouchard M.D.   On: 11/06/2017 16:01   Ct Angio Chest Pe W And/or Wo Contrast  Result Date: 11/06/2017 CLINICAL DATA:  Weakness and recent loss of consciousness. EXAM: CT ANGIOGRAPHY CHEST WITH CONTRAST TECHNIQUE: Multidetector CT imaging of the chest was performed using the standard protocol during bolus administration of intravenous contrast. Multiplanar CT image reconstructions and MIPs were obtained to evaluate the vascular anatomy. CONTRAST:  63mL ISOVUE-370 IOPAMIDOL (ISOVUE-370) INJECTION 76% COMPARISON:  Chest radiograph 11/06/2017 CT abdomen pelvis 08/21/2017 FINDINGS: Cardiovascular: --Pulmonary arteries: Contrast injection is sufficient to demonstrate satisfactory opacification of the  pulmonary arteries to the segmental level. There is no pulmonary embolus. The main pulmonary artery is within normal limits for size. --Aorta: Satisfactory opacification of the thoracic aorta. No aortic dissection or other acute aortic syndrome. Conventional 3 vessel aortic branching pattern. The aortic course and caliber are normal. There is mild aortic atherosclerosis. --Heart: Normal size. No pericardial effusion. Mediastinum/Nodes: No mediastinal, hilar or axillary lymphadenopathy. The visualized thyroid and thoracic esophageal course are unremarkable. Lungs/Pleura: 11 x 7 mm right lower lobe pulmonary nodule. No pleural effusion. No focal consolidation. No pulmonary infarction. Upper Abdomen: Contrast bolus timing is not optimized for  evaluation of the abdominal organs. Bilateral perinephric edema. This is unchanged from the CT abdomen pelvis 08/21/2017. Musculoskeletal: Thoracic spine mild degenerative change. Review of the MIP images confirms the above findings. IMPRESSION: 1. No pulmonary embolus or acute aortic syndrome. 2.  Aortic Atherosclerosis (ICD10-I70.0). 3. 11 x 7 mm right lower lobe pulmonary nodule. Consider one of the following in 3 months for both low-risk and high-risk individuals: (a) repeat chest CT, (b) follow-up PET-CT, or (c) tissue sampling. This recommendation follows the consensus statement: Guidelines for Management of Incidental Pulmonary Nodules Detected on CT Images: From the Fleischner Society 2017; Radiology 2017; 284:228-243. Electronically Signed   By: Ulyses Jarred M.D.   On: 11/06/2017 18:08   Ct Cervical Spine Wo Contrast  Result Date: 11/06/2017 CLINICAL DATA:  Per ED notes: Pt states feeling weak and loss consciousness last night at work. Pt hit the back of her head and now c/o of lower back, bilateral legs and shoulder pain. Pt states she has felt weak x 4 days. EXAM: CT HEAD WITHOUT CONTRAST CT CERVICAL SPINE WITHOUT CONTRAST TECHNIQUE: Multidetector CT imaging of the head and cervical spine was performed following the standard protocol without intravenous contrast. Multiplanar CT image reconstructions of the cervical spine were also generated. COMPARISON:  PET-CT 02/12/2009 FINDINGS: CT HEAD FINDINGS Brain: No intracranial hemorrhage. No parenchymal contusion. No midline shift or mass effect. Basilar cisterns are patent. No skull base fracture. No fluid in the paranasal sinuses or mastoid air cells. Orbits are normal. Vascular: No hyperdense vessel or unexpected calcification. Skull: Normal. Negative for fracture or focal lesion. Sinuses/Orbits: No acute finding. Other: None. CT CERVICAL SPINE FINDINGS Alignment: Normal alignment of the cervical vertebral bodies. Skull base and vertebrae: Normal  craniocervical junction. No loss of vertebral body height or disc height. Normal facet articulation. No evidence of fracture. Soft tissues and spinal canal: No prevertebral soft tissue swelling. No perispinal or epidural hematoma. Disc levels:  Unremarkable Upper chest: Clear Other: None IMPRESSION: 1. No intracranial trauma. 2. No cervical spine fracture. Electronically Signed   By: Suzy Bouchard M.D.   On: 11/06/2017 16:01    Microbiology: No results found for this or any previous visit (from the past 240 hour(s)).   Labs: Basic Metabolic Panel: Recent Labs  Lab 11/06/17 1537  NA 137  K 4.9  CL 103  CO2 30  GLUCOSE 139*  BUN 27*  CREATININE 1.06*  CALCIUM 8.3*   Liver Function Tests: Recent Labs  Lab 11/06/17 1537  AST 12*  ALT 11  ALKPHOS 50  BILITOT 0.5  PROT 6.0*  ALBUMIN 3.4*   No results for input(s): LIPASE, AMYLASE in the last 168 hours. No results for input(s): AMMONIA in the last 168 hours. CBC: Recent Labs  Lab 11/06/17 1537  WBC 8.0  NEUTROABS 4.9  HGB 12.6  HCT 39.8  MCV 93.6  PLT 150  Cardiac Enzymes: Recent Labs  Lab 11/06/17 1537  TROPONINI <0.03   BNP: BNP (last 3 results) No results for input(s): BNP in the last 8760 hours.  ProBNP (last 3 results) No results for input(s): PROBNP in the last 8760 hours.  CBG: Recent Labs  Lab 11/06/17 1534  GLUCAP 131*       Signed:  Rowe Clack N  [facility] 11/07/2017, 8:42 AM  Called updated patient with urine cultures. She has no fevers or symptoms of UTI.   nProbable asymptomatic bacteruria. I have recommended  to recheck UA with PCP. She plans to f/u this week. Kinnie Feil

## 2017-11-07 NOTE — Plan of Care (Signed)
SATURATION QUALIFICATIONS: (This note is used to comply with regulatory documentation for home oxygen)  Patient Saturations on Room Air at Rest =86%  Patient Saturations on Room Air while Ambulating = 82%  Patient Saturations on 3 Liters of oxygen while Ambulating = 91%  Please briefly explain why patient needs home oxygen: Patient desats while ambulating.

## 2017-11-07 NOTE — Progress Notes (Signed)
[facility] PROGRESS NOTE  [name] PZW:[id number] DOB: 1950-12-16 DOA: 11/06/2017 PCP: Redmond School, MD  Brief summary   67 y.o. female with a history of diabetes, hypertension, COPD, tobacco use, chronic pains, hyperlipidemia, admitted wit copd exacerbation Patient had a syncopal episode yesterday when she fell she hit her lower and upper back on a shelf.  She was only out for a brief period of time and recovered spontaneously.  She went home with her husband and had an uneventful night.  Today, the patient was brought to the emergency department due to pain from hitting her back.  It is nonradiating.  Movement increases pain.  While here, she was noted to be hypoxic, especially when she ambulated.  Admitting O2 sats were 84%.  Denies cough, shortness of breath.  Denies frequency, hesitancy, dysuria.  Emergency Department Course: Received 60 mg of prednisone.  Chest x-ray shows no infiltrate.  White count was normal.  CTA shows small 11 x 7 mm right lower lobe pulmonary nodule.   Assessment/Plan:  COPD exacerbation. NO clear infiltrates on x ray imaging.  Continues to improve with bronchodilators, prednisone. Counseled to stop smoking   Acute hypoxic respiratory failure due to copd/tobacco use. Hypoxia->resolved with low flow oxygen. Cont as above. desat study   Right pulmonary nodule. D/w patient, recommended to repeat CT in 3 months and f/u with pulmonology   Type 2 diabetes. Continue home medication  Back pain. tramadol  Hypertension. continue home medication  Recent syncope. Suspect due to zanaflex. Neuro exam is non focal. Ct head: no acute findings. Recommended to decrease zanaflex    Abnormal UA. No s/s of UTI. Afebrile   Code Status: full Family Communication: d/w patient, RN (indicate person spoken with, relationship, and if by phone, the number) Disposition Plan: home in 24-48 hrs    Consultants:  none  Procedures:  none  Antibiotics:  none  (indicate start date, and stop date if known)  HPI/Subjective: Alert. Reports feeling much better. No focal neuro symptoms. Still on oxygen, will obtain desat study  Objective: Vitals:   11/06/17 2312 11/07/17 0524  BP:  139/63  Pulse:  75  Resp:  18  Temp:  98.6 F (37 C)  SpO2: 91% 94%    Intake/Output Summary (Last 24 hours) at 11/07/2017 0832 Last data filed at 11/07/2017 0500 Gross per 24 hour  Intake 1477.08 ml  Output -  Net 1477.08 ml   Filed Weights   11/06/17 1448 11/06/17 2000  Weight: 80.3 kg (177 lb) 84.4 kg (186 lb 1.1 oz)    Exam:   General:  No distress   Cardiovascular: s1,s2 rrr  Respiratory: few wheezing   Abdomen: soft, nt   Musculoskeletal: no leg edema    Data Reviewed: Basic Metabolic Panel: Recent Labs  Lab 11/06/17 1537  NA 137  K 4.9  CL 103  CO2 30  GLUCOSE 139*  BUN 27*  CREATININE 1.06*  CALCIUM 8.3*   Liver Function Tests: Recent Labs  Lab 11/06/17 1537  AST 12*  ALT 11  ALKPHOS 50  BILITOT 0.5  PROT 6.0*  ALBUMIN 3.4*   No results for input(s): LIPASE, AMYLASE in the last 168 hours. No results for input(s): AMMONIA in the last 168 hours. CBC: Recent Labs  Lab 11/06/17 1537  WBC 8.0  NEUTROABS 4.9  HGB 12.6  HCT 39.8  MCV 93.6  PLT 150   Cardiac Enzymes: Recent Labs  Lab 11/06/17 1537  TROPONINI <0.03   BNP (last  3 results) No results for input(s): BNP in the last 8760 hours.  ProBNP (last 3 results) No results for input(s): PROBNP in the last 8760 hours.  CBG: Recent Labs  Lab 11/06/17 1534  GLUCAP 131*    No results found for this or any previous visit (from the past 240 hour(s)).   Studies: Dg Chest 1 View  Result Date: 11/06/2017 CLINICAL DATA:  Passed out and fell onto back at work today. EXAM: CHEST  1 VIEW COMPARISON:  10/01/2017 FINDINGS: Lungs are adequately inflated without consolidation, effusion or pneumothorax. Borderline cardiomegaly. No evidence of fracture. Mild  degenerative change of the spine. IMPRESSION: No acute findings. Electronically Signed   By: Marin Olp M.D.   On: 11/06/2017 16:11   Dg Thoracic Spine 2 View  Result Date: 11/06/2017 CLINICAL DATA:  Fall at work today landing on back. EXAM: THORACIC SPINE 2 VIEWS COMPARISON:  Chest x-ray 10/01/2017 FINDINGS: Vertebral body alignment is within normal. There is mild spondylosis throughout the thoracic spine. Pedicles are intact. There is a mild compression deformity of T11 unchanged. Remainder of the exam is unchanged. IMPRESSION: No acute findings. Mild spondylosis of the thoracic spine with mild stable compression deformity of T11. Electronically Signed   By: Marin Olp M.D.   On: 11/06/2017 16:14   Dg Lumbar Spine Complete  Result Date: 11/06/2017 CLINICAL DATA:  Fall at work today landing on back. EXAM: LUMBAR SPINE - COMPLETE 4+ VIEW COMPARISON:  10/01/2017 and CT 08/21/2017 FINDINGS: Vertebral body alignment is within normal. There is mild spondylosis throughout the lumbar spine to include facet arthropathy. No evidence of acute compression fracture or subluxation. Disc spaces maintained. Calcified plaque over the abdominal aorta. IMPRESSION: No acute findings. Mild spondylosis throughout the lumbar spine. Electronically Signed   By: Marin Olp M.D.   On: 11/06/2017 16:16   Ct Head Wo Contrast  Result Date: 11/06/2017 CLINICAL DATA:  Per ED notes: Pt states feeling weak and loss consciousness last night at work. Pt hit the back of her head and now c/o of lower back, bilateral legs and shoulder pain. Pt states she has felt weak x 4 days. EXAM: CT HEAD WITHOUT CONTRAST CT CERVICAL SPINE WITHOUT CONTRAST TECHNIQUE: Multidetector CT imaging of the head and cervical spine was performed following the standard protocol without intravenous contrast. Multiplanar CT image reconstructions of the cervical spine were also generated. COMPARISON:  PET-CT 02/12/2009 FINDINGS: CT HEAD FINDINGS Brain: No  intracranial hemorrhage. No parenchymal contusion. No midline shift or mass effect. Basilar cisterns are patent. No skull base fracture. No fluid in the paranasal sinuses or mastoid air cells. Orbits are normal. Vascular: No hyperdense vessel or unexpected calcification. Skull: Normal. Negative for fracture or focal lesion. Sinuses/Orbits: No acute finding. Other: None. CT CERVICAL SPINE FINDINGS Alignment: Normal alignment of the cervical vertebral bodies. Skull base and vertebrae: Normal craniocervical junction. No loss of vertebral body height or disc height. Normal facet articulation. No evidence of fracture. Soft tissues and spinal canal: No prevertebral soft tissue swelling. No perispinal or epidural hematoma. Disc levels:  Unremarkable Upper chest: Clear Other: None IMPRESSION: 1. No intracranial trauma. 2. No cervical spine fracture. Electronically Signed   By: Suzy Bouchard M.D.   On: 11/06/2017 16:01   Ct Angio Chest Pe W And/or Wo Contrast  Result Date: 11/06/2017 CLINICAL DATA:  Weakness and recent loss of consciousness. EXAM: CT ANGIOGRAPHY CHEST WITH CONTRAST TECHNIQUE: Multidetector CT imaging of the chest was performed using the standard protocol during bolus  administration of intravenous contrast. Multiplanar CT image reconstructions and MIPs were obtained to evaluate the vascular anatomy. CONTRAST:  84mL ISOVUE-370 IOPAMIDOL (ISOVUE-370) INJECTION 76% COMPARISON:  Chest radiograph 11/06/2017 CT abdomen pelvis 08/21/2017 FINDINGS: Cardiovascular: --Pulmonary arteries: Contrast injection is sufficient to demonstrate satisfactory opacification of the pulmonary arteries to the segmental level. There is no pulmonary embolus. The main pulmonary artery is within normal limits for size. --Aorta: Satisfactory opacification of the thoracic aorta. No aortic dissection or other acute aortic syndrome. Conventional 3 vessel aortic branching pattern. The aortic course and caliber are normal. There is mild  aortic atherosclerosis. --Heart: Normal size. No pericardial effusion. Mediastinum/Nodes: No mediastinal, hilar or axillary lymphadenopathy. The visualized thyroid and thoracic esophageal course are unremarkable. Lungs/Pleura: 11 x 7 mm right lower lobe pulmonary nodule. No pleural effusion. No focal consolidation. No pulmonary infarction. Upper Abdomen: Contrast bolus timing is not optimized for evaluation of the abdominal organs. Bilateral perinephric edema. This is unchanged from the CT abdomen pelvis 08/21/2017. Musculoskeletal: Thoracic spine mild degenerative change. Review of the MIP images confirms the above findings. IMPRESSION: 1. No pulmonary embolus or acute aortic syndrome. 2.  Aortic Atherosclerosis (ICD10-I70.0). 3. 11 x 7 mm right lower lobe pulmonary nodule. Consider one of the following in 3 months for both low-risk and high-risk individuals: (a) repeat chest CT, (b) follow-up PET-CT, or (c) tissue sampling. This recommendation follows the consensus statement: Guidelines for Management of Incidental Pulmonary Nodules Detected on CT Images: From the Fleischner Society 2017; Radiology 2017; 284:228-243. Electronically Signed   By: Ulyses Jarred M.D.   On: 11/06/2017 18:08   Ct Cervical Spine Wo Contrast  Result Date: 11/06/2017 CLINICAL DATA:  Per ED notes: Pt states feeling weak and loss consciousness last night at work. Pt hit the back of her head and now c/o of lower back, bilateral legs and shoulder pain. Pt states she has felt weak x 4 days. EXAM: CT HEAD WITHOUT CONTRAST CT CERVICAL SPINE WITHOUT CONTRAST TECHNIQUE: Multidetector CT imaging of the head and cervical spine was performed following the standard protocol without intravenous contrast. Multiplanar CT image reconstructions of the cervical spine were also generated. COMPARISON:  PET-CT 02/12/2009 FINDINGS: CT HEAD FINDINGS Brain: No intracranial hemorrhage. No parenchymal contusion. No midline shift or mass effect. Basilar cisterns  are patent. No skull base fracture. No fluid in the paranasal sinuses or mastoid air cells. Orbits are normal. Vascular: No hyperdense vessel or unexpected calcification. Skull: Normal. Negative for fracture or focal lesion. Sinuses/Orbits: No acute finding. Other: None. CT CERVICAL SPINE FINDINGS Alignment: Normal alignment of the cervical vertebral bodies. Skull base and vertebrae: Normal craniocervical junction. No loss of vertebral body height or disc height. Normal facet articulation. No evidence of fracture. Soft tissues and spinal canal: No prevertebral soft tissue swelling. No perispinal or epidural hematoma. Disc levels:  Unremarkable Upper chest: Clear Other: None IMPRESSION: 1. No intracranial trauma. 2. No cervical spine fracture. Electronically Signed   By: Suzy Bouchard M.D.   On: 11/06/2017 16:01    Scheduled Meds: . busPIRone  10 mg Oral BID  . citalopram  20 mg Oral Daily  . enalapril  20 mg Oral Daily  . enoxaparin (LOVENOX) injection  40 mg Subcutaneous Q24H  . gabapentin  300 mg Oral TID  . ipratropium-albuterol  3 mL Nebulization QID  . linagliptin  5 mg Oral Daily   And  . metFORMIN  500 mg Oral BID WC  . meloxicam  15 mg Oral Daily  . nicotine  14 mg Transdermal Daily  . pravastatin  40 mg Oral q1800  . predniSONE  40 mg Oral Q breakfast   Continuous Infusions: . sodium chloride Stopped (11/06/17 1836)    Principal Problem:   Hypoxia Active Problems:   Type 2 diabetes mellitus without complication (HCC)   HTN (hypertension)   COPD exacerbation (HCC)   Back pain   Pulmonary nodule, right    Time spent: >35 minutes     Kinnie Feil  [facility] Pager [phone]. If 7PM-7AM, please contact night-coverage at [email or web address], password [facility] 11/07/2017, 8:32 AM  LOS: 0 days

## 2017-11-07 NOTE — Plan of Care (Signed)
Discharge: Patient is alert and oriented x 4. Her vitals are stable. Patient's oxygen was delivered to the room by [employer]. Reviewed discharge instructions with patient including not resuming her Metformin for 48 hours. MD also instructed patient to stop taking Zanaflex and Norco. Patient to pick up her new medications Spiriva, Advair, and Prednisone at the Pharmacy. Reviewed times to take next medications. Patient verbalized understanding of all discharge instructions. She plans to follow-up with her primary care next week. Her iv was removed, and patient was taken down via wheelchair by nurse tech.

## 2017-11-09 DIAGNOSIS — R55 Syncope and collapse: Secondary | ICD-10-CM | POA: Diagnosis not present

## 2017-11-09 DIAGNOSIS — R5383 Other fatigue: Secondary | ICD-10-CM | POA: Diagnosis not present

## 2017-11-09 DIAGNOSIS — E6609 Other obesity due to excess calories: Secondary | ICD-10-CM | POA: Diagnosis not present

## 2017-11-09 DIAGNOSIS — J9600 Acute respiratory failure, unspecified whether with hypoxia or hypercapnia: Secondary | ICD-10-CM | POA: Diagnosis not present

## 2017-11-09 DIAGNOSIS — Z6830 Body mass index (BMI) 30.0-30.9, adult: Secondary | ICD-10-CM | POA: Diagnosis not present

## 2017-11-09 DIAGNOSIS — J441 Chronic obstructive pulmonary disease with (acute) exacerbation: Secondary | ICD-10-CM | POA: Diagnosis not present

## 2017-11-09 DIAGNOSIS — R911 Solitary pulmonary nodule: Secondary | ICD-10-CM | POA: Diagnosis not present

## 2017-11-09 LAB — URINE CULTURE: Culture: >=100000 — AB

## 2017-11-10 DIAGNOSIS — J441 Chronic obstructive pulmonary disease with (acute) exacerbation: Secondary | ICD-10-CM | POA: Diagnosis not present

## 2017-11-18 DIAGNOSIS — E6609 Other obesity due to excess calories: Secondary | ICD-10-CM | POA: Diagnosis not present

## 2017-11-18 DIAGNOSIS — Z6830 Body mass index (BMI) 30.0-30.9, adult: Secondary | ICD-10-CM | POA: Diagnosis not present

## 2017-11-18 DIAGNOSIS — B370 Candidal stomatitis: Secondary | ICD-10-CM | POA: Diagnosis not present

## 2017-11-18 DIAGNOSIS — N342 Other urethritis: Secondary | ICD-10-CM | POA: Diagnosis not present

## 2017-11-18 DIAGNOSIS — I10 Essential (primary) hypertension: Secondary | ICD-10-CM | POA: Diagnosis not present

## 2017-12-11 DIAGNOSIS — J441 Chronic obstructive pulmonary disease with (acute) exacerbation: Secondary | ICD-10-CM | POA: Diagnosis not present

## 2017-12-13 DIAGNOSIS — R634 Abnormal weight loss: Secondary | ICD-10-CM | POA: Diagnosis not present

## 2017-12-13 DIAGNOSIS — R911 Solitary pulmonary nodule: Secondary | ICD-10-CM | POA: Diagnosis not present

## 2017-12-13 DIAGNOSIS — J9611 Chronic respiratory failure with hypoxia: Secondary | ICD-10-CM | POA: Diagnosis not present

## 2017-12-13 DIAGNOSIS — J449 Chronic obstructive pulmonary disease, unspecified: Secondary | ICD-10-CM | POA: Diagnosis not present

## 2017-12-23 DIAGNOSIS — E119 Type 2 diabetes mellitus without complications: Secondary | ICD-10-CM | POA: Diagnosis not present

## 2017-12-23 DIAGNOSIS — R42 Dizziness and giddiness: Secondary | ICD-10-CM | POA: Diagnosis not present

## 2017-12-23 DIAGNOSIS — Z23 Encounter for immunization: Secondary | ICD-10-CM | POA: Diagnosis not present

## 2017-12-23 DIAGNOSIS — E6609 Other obesity due to excess calories: Secondary | ICD-10-CM | POA: Diagnosis not present

## 2017-12-23 DIAGNOSIS — Z6830 Body mass index (BMI) 30.0-30.9, adult: Secondary | ICD-10-CM | POA: Diagnosis not present

## 2017-12-23 DIAGNOSIS — I10 Essential (primary) hypertension: Secondary | ICD-10-CM | POA: Diagnosis not present

## 2017-12-28 ENCOUNTER — Other Ambulatory Visit (HOSPITAL_COMMUNITY): Payer: Self-pay | Admitting: Pulmonary Disease

## 2017-12-28 DIAGNOSIS — R911 Solitary pulmonary nodule: Secondary | ICD-10-CM

## 2018-01-07 ENCOUNTER — Ambulatory Visit (HOSPITAL_COMMUNITY)
Admission: RE | Admit: 2018-01-07 | Discharge: 2018-01-07 | Disposition: A | Discharge status: Home / Self Care | Payer: Medicare HMO | Source: Ambulatory Visit | Attending: Pulmonary Disease | Admitting: Pulmonary Disease

## 2018-01-07 DIAGNOSIS — I700 Atherosclerosis of aorta: Secondary | ICD-10-CM | POA: Diagnosis not present

## 2018-01-07 DIAGNOSIS — R222 Localized swelling, mass and lump, trunk: Secondary | ICD-10-CM | POA: Insufficient documentation

## 2018-01-07 DIAGNOSIS — R911 Solitary pulmonary nodule: Secondary | ICD-10-CM | POA: Diagnosis not present

## 2018-01-07 LAB — GLUCOSE, CAPILLARY: GLUCOSE-CAPILLARY: 125 mg/dL — AB (ref 70–99)

## 2018-01-07 MED ORDER — FLUDEOXYGLUCOSE F - 18 (FDG) INJECTION
8.8700 | Freq: Once | INTRAVENOUS | Status: AC | PRN
  Administered 2018-01-07: 8.87 via INTRAVENOUS

## 2018-01-11 DIAGNOSIS — J441 Chronic obstructive pulmonary disease with (acute) exacerbation: Secondary | ICD-10-CM | POA: Diagnosis not present

## 2018-02-09 DIAGNOSIS — Z0000 Encounter for general adult medical examination without abnormal findings: Secondary | ICD-10-CM | POA: Diagnosis not present

## 2018-02-09 DIAGNOSIS — I10 Essential (primary) hypertension: Secondary | ICD-10-CM | POA: Diagnosis not present

## 2018-02-09 DIAGNOSIS — E1140 Type 2 diabetes mellitus with diabetic neuropathy, unspecified: Secondary | ICD-10-CM | POA: Diagnosis not present

## 2018-02-10 DIAGNOSIS — J441 Chronic obstructive pulmonary disease with (acute) exacerbation: Secondary | ICD-10-CM | POA: Diagnosis not present

## 2018-02-21 ENCOUNTER — Ambulatory Visit: Payer: Medicare HMO | Admitting: Orthopedic Surgery

## 2018-02-21 VITALS — BP 115/67 | HR 82 | Ht 64.0 in | Wt 183.0 lb

## 2018-02-21 DIAGNOSIS — M1710 Unilateral primary osteoarthritis, unspecified knee: Secondary | ICD-10-CM

## 2018-02-21 DIAGNOSIS — M25462 Effusion, left knee: Secondary | ICD-10-CM

## 2018-02-21 NOTE — Progress Notes (Signed)
Progress Note   Patient ID: [name], female   DOB: June 01, 1950, 67 y.o.   MRN: [id number]   Chief Complaint  Patient presents with  . Follow-up    Recheck on left knee.    HPI The patient presents for evaluation of ongoing pain left knee.  The patient has received an injection in the left knee she says she feels like she has fluid on the knee her pain is worsening her functioning is decreasing in terms of climbing steps quality of life is deteriorating.  She complains of a dull aching pain left knee with swelling and decreased range of motion and functional activities deficit as stated  Review of Systems  Respiratory: Negative for shortness of breath.   Cardiovascular: Negative for chest pain.   No outpatient medications have been marked as taking for the 02/21/18 encounter (Office Visit) with Carole Civil, MD.    Past Medical History:  Diagnosis Date  . Arthritis    "hands, knees, feet"  . Asthma   . Colon polyps   . COPD (chronic obstructive pulmonary disease) ([place])   . Hyperlipidemia   . Hypertension   . Peripheral edema   . Pneumonia 08/2010; 11/2010  . SBO (small bowel obstruction) ([place]) 08/25/11   recurrent  . Type II diabetes mellitus (HCC)      Allergies  Allergen Reactions  . Hydrocodone-Acetaminophen Other (See Comments)    Itching. Pt states she can tolerate acetaminophen  . Penicillins Hives and Itching    Has patient had a PCN reaction causing immediate rash, facial/tongue/throat swelling, SOB or lightheadedness with hypotension: Yes Has patient had a PCN reaction causing severe rash involving mucus membranes or skin necrosis: No Has patient had a PCN reaction that required hospitalization No Has patient had a PCN reaction occurring within the last 10 years: No If all of the above answers are "NO", then may proceed with Cephalosporin use.  Has patient had a PCN reaction causing immediate rash, facial/tongue/throat swelling, SOB or lightheadedness  with hypotension: Yes Has patient had a PCN reaction causing severe rash involving mucus membranes or skin necrosis: No Has patient had a PCN reaction that required hospitalization No Has patient had a PCN reaction occurring within the last 10 years: No If all of the above answers are "NO", then may proceed with Cephalosporin use. Can tolerate cephalosporins, per pt Electronically signed by: Rudene Christians, [facility] 06/07/2013 10:43 AM  . Vicodin [Hydrocodone-Acetaminophen]     itching     BP 115/67   Pulse 82   Ht 5\' 4"  (1.626 m)   Wt 183 lb (83 kg)   BMI 31.41 kg/m    Physical Exam General appearance normal Oriented x3 normal Mood pleasant affect normal Gait left lower extremity antalgic gait Ortho Exam Right knee is asymptomatic at present with normal alignment flexion arc 120 degrees ligaments stable muscle strength and tone normal skin is excellent pulses good sensation is normal  Left knee has an effusion with diffuse tenderness flexion painful from 90 through 110 degrees which is maximum flexion she is stable with anterior drawer and posterior drawer test collateral ligament stable as well muscle strength and tone normal no tremor skin intact pulses good sensation normal  MEDICAL DECISION MAKING   Imaging:  Our imaging show degenerative arthritis end-stage   Encounter Diagnoses  Name Primary?  . Primary localized osteoarthritis of knee Yes  . Effusion of knee joint, left      PLAN: (RX., injection, surgery,frx,mri/ct, XR 2  body ares) She is considering knee replacement surgery but timing is an issue right now so we are going to wait.  We will do an aspiration today injection and then come back on as-needed basis for injections aspiration until she is ready to have surgery  I did aspirate 25 cc of clear yellow fluid  Procedure note injection and aspiration left knee joint  Verbal consent was obtained to aspirate and inject the left knee joint   Timeout was  completed to confirm the site of aspiration and injection  An 18-gauge needle was used to aspirate the left knee joint from a suprapatellar lateral approach.  The medications used were 40 mg of Depo-Medrol and 1% lidocaine 3 cc  Anesthesia was provided by ethyl chloride and the skin was prepped with alcohol.  After cleaning the skin with alcohol an 18-gauge needle was used to aspirate the right knee joint.  We obtained 25 cc of fluid  We followed this by injection of 40 mg of Depo-Medrol and 3 cc 1% lidocaine.  There were no complications. A sterile bandage was applied.   No orders of the defined types were placed in this encounter.  4:54 PM 02/21/2018

## 2018-03-11 DIAGNOSIS — Z1389 Encounter for screening for other disorder: Secondary | ICD-10-CM | POA: Diagnosis not present

## 2018-03-11 DIAGNOSIS — I10 Essential (primary) hypertension: Secondary | ICD-10-CM | POA: Diagnosis not present

## 2018-03-11 DIAGNOSIS — J449 Chronic obstructive pulmonary disease, unspecified: Secondary | ICD-10-CM | POA: Diagnosis not present

## 2018-03-11 DIAGNOSIS — Z6830 Body mass index (BMI) 30.0-30.9, adult: Secondary | ICD-10-CM | POA: Diagnosis not present

## 2018-03-11 DIAGNOSIS — J069 Acute upper respiratory infection, unspecified: Secondary | ICD-10-CM | POA: Diagnosis not present

## 2018-03-11 DIAGNOSIS — E1140 Type 2 diabetes mellitus with diabetic neuropathy, unspecified: Secondary | ICD-10-CM | POA: Diagnosis not present

## 2018-03-11 DIAGNOSIS — Z0001 Encounter for general adult medical examination with abnormal findings: Secondary | ICD-10-CM | POA: Diagnosis not present

## 2018-03-13 DIAGNOSIS — J441 Chronic obstructive pulmonary disease with (acute) exacerbation: Secondary | ICD-10-CM | POA: Diagnosis not present

## 2018-03-14 DIAGNOSIS — E119 Type 2 diabetes mellitus without complications: Secondary | ICD-10-CM | POA: Diagnosis not present

## 2018-03-14 DIAGNOSIS — J449 Chronic obstructive pulmonary disease, unspecified: Secondary | ICD-10-CM | POA: Diagnosis not present

## 2018-03-14 DIAGNOSIS — I10 Essential (primary) hypertension: Secondary | ICD-10-CM | POA: Diagnosis not present

## 2018-03-14 DIAGNOSIS — Z6830 Body mass index (BMI) 30.0-30.9, adult: Secondary | ICD-10-CM | POA: Diagnosis not present

## 2018-03-14 DIAGNOSIS — E6609 Other obesity due to excess calories: Secondary | ICD-10-CM | POA: Diagnosis not present

## 2018-03-14 DIAGNOSIS — J9801 Acute bronchospasm: Secondary | ICD-10-CM | POA: Diagnosis not present

## 2018-03-14 DIAGNOSIS — J329 Chronic sinusitis, unspecified: Secondary | ICD-10-CM | POA: Diagnosis not present

## 2018-03-21 ENCOUNTER — Telehealth: Payer: Self-pay | Admitting: Orthopedic Surgery

## 2018-03-21 NOTE — Telephone Encounter (Signed)
Patient called to relay that her left knee has begun swelling and hurting again. Had been seen for this problem 02/21/18 and had injection. Asking about coming back in, or is there another recommendation?

## 2018-03-22 NOTE — Telephone Encounter (Signed)
Come back in 

## 2018-03-25 NOTE — Telephone Encounter (Signed)
Called patient; scheduled appointment; aware. °

## 2018-03-30 ENCOUNTER — Ambulatory Visit: Payer: Medicare HMO | Admitting: Orthopedic Surgery

## 2018-03-30 VITALS — BP 124/73 | HR 77 | Ht 64.0 in | Wt 183.0 lb

## 2018-03-30 DIAGNOSIS — M25462 Effusion, left knee: Secondary | ICD-10-CM

## 2018-03-30 DIAGNOSIS — M1712 Unilateral primary osteoarthritis, left knee: Secondary | ICD-10-CM

## 2018-03-30 DIAGNOSIS — M25362 Other instability, left knee: Secondary | ICD-10-CM | POA: Diagnosis not present

## 2018-03-30 DIAGNOSIS — M1710 Unilateral primary osteoarthritis, unspecified knee: Secondary | ICD-10-CM

## 2018-03-30 NOTE — Progress Notes (Signed)
Progress Note   Patient ID: [name], female   DOB: 03-Aug-1950, 67 y.o.   MRN: [id number]   Chief Complaint  Patient presents with  . Follow-up    Recheck on left knee.    67 year old female needs left knee arthroplasty but timing issue at this time x-ray taken Sep 02, 2017 shows severe arthritis of the medial compartment secondary bone changes joint space narrowing and osteopenia  Injection was done on November 4 along with aspiration with good result until recent days.  We obtained 25 cc of fluid at that time she presents back for as needed aspiration injection  She is concerned that the knee is giving out more and she is feeling more crepitation    Review of Systems  Musculoskeletal: Positive for back pain.  Neurological: Negative for tingling.     Allergies  Allergen Reactions  . Hydrocodone-Acetaminophen Other (See Comments)    Itching. Pt states she can tolerate acetaminophen  . Penicillins Hives and Itching    Has patient had a PCN reaction causing immediate rash, facial/tongue/throat swelling, SOB or lightheadedness with hypotension: Yes Has patient had a PCN reaction causing severe rash involving mucus membranes or skin necrosis: No Has patient had a PCN reaction that required hospitalization No Has patient had a PCN reaction occurring within the last 10 years: No If all of the above answers are "NO", then may proceed with Cephalosporin use.  Has patient had a PCN reaction causing immediate rash, facial/tongue/throat swelling, SOB or lightheadedness with hypotension: Yes Has patient had a PCN reaction causing severe rash involving mucus membranes or skin necrosis: No Has patient had a PCN reaction that required hospitalization No Has patient had a PCN reaction occurring within the last 10 years: No If all of the above answers are "NO", then may proceed with Cephalosporin use. Can tolerate cephalosporins, per pt Electronically signed by: Rudene Christians, [facility]  06/07/2013 10:43 AM  . Vicodin [Hydrocodone-Acetaminophen]     itching     BP 124/73   Pulse 77   Ht 5\' 4"  (1.626 m)   Wt 183 lb (83 kg)   BMI 31.41 kg/m   Physical Exam Bryah is limping today she is otherwise alert awake and oriented x3 her mood is pleasant her affect is somewhat flat she has painful range of motion in her left knee with medium sized joint effusion she remains neurovascular intact skin is normal no atrophy is noted she has bilateral pes planus which is chronic  Medical decisions:   Data  Imaging:   Prior imaging showed she has osteoarthritis needs a knee replacement but timing is preventing her from doing so at this point  Encounter Diagnoses  Name Primary?  . Primary localized osteoarthritis of knee Yes  . Effusion of knee joint, left   . Instability of left knee joint     PLAN:   Continue nonoperative treatment aspiration injection left knee Brace left knee sent to [facility] because of the size of her leg  Procedure note injection and aspiration left knee joint  Verbal consent was obtained to aspirate and inject the left knee joint   Timeout was completed to confirm the site of aspiration and injection  An 18-gauge needle was used to aspirate the left knee joint from a suprapatellar lateral approach.  The medications used were 40 mg of Depo-Medrol and 1% lidocaine 3 cc  Anesthesia was provided by ethyl chloride and the skin was prepped with alcohol.  After cleaning the skin  with alcohol an 18-gauge needle was used to aspirate the right knee joint.  We obtained 18 cc of fluid, pink fluid  We followed this by injection of 40 mg of Depo-Medrol and 3 cc 1% lidocaine.  There were no complications. A sterile bandage was applied.   Arther Abbott, MD 03/30/2018 5:24 PM

## 2018-04-12 DIAGNOSIS — J441 Chronic obstructive pulmonary disease with (acute) exacerbation: Secondary | ICD-10-CM | POA: Diagnosis not present

## 2018-05-12 ENCOUNTER — Emergency Department (HOSPITAL_COMMUNITY): Payer: Medicare HMO

## 2018-05-12 ENCOUNTER — Encounter (HOSPITAL_COMMUNITY): Payer: Self-pay | Admitting: Emergency Medicine

## 2018-05-12 ENCOUNTER — Other Ambulatory Visit: Payer: Self-pay

## 2018-05-12 ENCOUNTER — Emergency Department (HOSPITAL_COMMUNITY)
Admission: EM | Admit: 2018-05-12 | Discharge: 2018-05-12 | Discharge status: Against Medical Advice | Payer: Medicare HMO

## 2018-05-12 ENCOUNTER — Inpatient Hospital Stay (HOSPITAL_COMMUNITY)
Admission: EM | Admit: 2018-05-12 | Discharge: 2018-05-14 | DRG: 190 | Disposition: A | Discharge status: Home / Self Care | Payer: Medicare HMO | Attending: Family Medicine | Admitting: Family Medicine

## 2018-05-12 DIAGNOSIS — F17210 Nicotine dependence, cigarettes, uncomplicated: Secondary | ICD-10-CM | POA: Diagnosis present

## 2018-05-12 DIAGNOSIS — E119 Type 2 diabetes mellitus without complications: Secondary | ICD-10-CM | POA: Diagnosis not present

## 2018-05-12 DIAGNOSIS — E1165 Type 2 diabetes mellitus with hyperglycemia: Secondary | ICD-10-CM | POA: Diagnosis present

## 2018-05-12 DIAGNOSIS — R509 Fever, unspecified: Secondary | ICD-10-CM | POA: Diagnosis not present

## 2018-05-12 DIAGNOSIS — J189 Pneumonia, unspecified organism: Secondary | ICD-10-CM | POA: Diagnosis not present

## 2018-05-12 DIAGNOSIS — Z800 Family history of malignant neoplasm of digestive organs: Secondary | ICD-10-CM

## 2018-05-12 DIAGNOSIS — Z79891 Long term (current) use of opiate analgesic: Secondary | ICD-10-CM

## 2018-05-12 DIAGNOSIS — G8929 Other chronic pain: Secondary | ICD-10-CM | POA: Diagnosis present

## 2018-05-12 DIAGNOSIS — I10 Essential (primary) hypertension: Secondary | ICD-10-CM | POA: Diagnosis present

## 2018-05-12 DIAGNOSIS — J441 Chronic obstructive pulmonary disease with (acute) exacerbation: Principal | ICD-10-CM | POA: Diagnosis present

## 2018-05-12 DIAGNOSIS — E1140 Type 2 diabetes mellitus with diabetic neuropathy, unspecified: Secondary | ICD-10-CM | POA: Diagnosis not present

## 2018-05-12 DIAGNOSIS — Z880 Allergy status to penicillin: Secondary | ICD-10-CM | POA: Diagnosis not present

## 2018-05-12 DIAGNOSIS — Z7951 Long term (current) use of inhaled steroids: Secondary | ICD-10-CM | POA: Diagnosis not present

## 2018-05-12 DIAGNOSIS — F329 Major depressive disorder, single episode, unspecified: Secondary | ICD-10-CM | POA: Diagnosis present

## 2018-05-12 DIAGNOSIS — Z8719 Personal history of other diseases of the digestive system: Secondary | ICD-10-CM

## 2018-05-12 DIAGNOSIS — R000 Tachycardia, unspecified: Secondary | ICD-10-CM | POA: Diagnosis not present

## 2018-05-12 DIAGNOSIS — Z9049 Acquired absence of other specified parts of digestive tract: Secondary | ICD-10-CM

## 2018-05-12 DIAGNOSIS — M8949 Other hypertrophic osteoarthropathy, multiple sites: Secondary | ICD-10-CM | POA: Diagnosis present

## 2018-05-12 DIAGNOSIS — R9431 Abnormal electrocardiogram [ECG] [EKG]: Secondary | ICD-10-CM | POA: Diagnosis present

## 2018-05-12 DIAGNOSIS — D696 Thrombocytopenia, unspecified: Secondary | ICD-10-CM | POA: Diagnosis present

## 2018-05-12 DIAGNOSIS — X58XXXA Exposure to other specified factors, initial encounter: Secondary | ICD-10-CM | POA: Diagnosis present

## 2018-05-12 DIAGNOSIS — R0902 Hypoxemia: Secondary | ICD-10-CM | POA: Diagnosis not present

## 2018-05-12 DIAGNOSIS — J101 Influenza due to other identified influenza virus with other respiratory manifestations: Secondary | ICD-10-CM | POA: Diagnosis present

## 2018-05-12 DIAGNOSIS — Z7984 Long term (current) use of oral hypoglycemic drugs: Secondary | ICD-10-CM | POA: Diagnosis not present

## 2018-05-12 DIAGNOSIS — J9621 Acute and chronic respiratory failure with hypoxia: Secondary | ICD-10-CM | POA: Diagnosis present

## 2018-05-12 DIAGNOSIS — Z79899 Other long term (current) drug therapy: Secondary | ICD-10-CM | POA: Diagnosis not present

## 2018-05-12 DIAGNOSIS — Z716 Tobacco abuse counseling: Secondary | ICD-10-CM

## 2018-05-12 DIAGNOSIS — E785 Hyperlipidemia, unspecified: Secondary | ICD-10-CM | POA: Diagnosis present

## 2018-05-12 DIAGNOSIS — T380X5A Adverse effect of glucocorticoids and synthetic analogues, initial encounter: Secondary | ICD-10-CM | POA: Diagnosis not present

## 2018-05-12 DIAGNOSIS — Z8701 Personal history of pneumonia (recurrent): Secondary | ICD-10-CM | POA: Diagnosis not present

## 2018-05-12 DIAGNOSIS — R0689 Other abnormalities of breathing: Secondary | ICD-10-CM | POA: Diagnosis not present

## 2018-05-12 DIAGNOSIS — Z9981 Dependence on supplemental oxygen: Secondary | ICD-10-CM

## 2018-05-12 DIAGNOSIS — Z885 Allergy status to narcotic agent status: Secondary | ICD-10-CM | POA: Diagnosis not present

## 2018-05-12 DIAGNOSIS — R0602 Shortness of breath: Secondary | ICD-10-CM | POA: Diagnosis not present

## 2018-05-12 DIAGNOSIS — Z86010 Personal history of colonic polyps: Secondary | ICD-10-CM

## 2018-05-12 DIAGNOSIS — R069 Unspecified abnormalities of breathing: Secondary | ICD-10-CM | POA: Diagnosis not present

## 2018-05-12 DIAGNOSIS — Z6830 Body mass index (BMI) 30.0-30.9, adult: Secondary | ICD-10-CM | POA: Diagnosis not present

## 2018-05-12 DIAGNOSIS — E063 Autoimmune thyroiditis: Secondary | ICD-10-CM | POA: Diagnosis not present

## 2018-05-12 LAB — CBC WITH DIFFERENTIAL/PLATELET
Abs Immature Granulocytes: 0.04 10*3/uL (ref 0.00–0.07)
BASOS ABS: 0 10*3/uL (ref 0.0–0.1)
Basophils Relative: 1 %
EOS ABS: 0 10*3/uL (ref 0.0–0.5)
Eosinophils Relative: 1 %
HCT: 45.7 % (ref 36.0–46.0)
Hemoglobin: 14.3 g/dL (ref 12.0–15.0)
Immature Granulocytes: 1 %
Lymphocytes Relative: 9 %
Lymphs Abs: 0.5 10*3/uL — ABNORMAL LOW (ref 0.7–4.0)
MCH: 29.5 pg (ref 26.0–34.0)
MCHC: 31.3 g/dL (ref 30.0–36.0)
MCV: 94.2 fL (ref 80.0–100.0)
Monocytes Absolute: 0.3 10*3/uL (ref 0.1–1.0)
Monocytes Relative: 5 %
Neutro Abs: 4.7 10*3/uL (ref 1.7–7.7)
Neutrophils Relative %: 83 %
Platelets: 116 10*3/uL — ABNORMAL LOW (ref 150–400)
RBC: 4.85 MIL/uL (ref 3.87–5.11)
RDW: 13.8 % (ref 11.5–15.5)
WBC: 5.6 10*3/uL (ref 4.0–10.5)
nRBC: 0 % (ref 0.0–0.2)

## 2018-05-12 LAB — COMPREHENSIVE METABOLIC PANEL
ALBUMIN: 3.6 g/dL (ref 3.5–5.0)
ALT: 15 U/L (ref 0–44)
AST: 19 U/L (ref 15–41)
Alkaline Phosphatase: 53 U/L (ref 38–126)
Anion gap: 11 (ref 5–15)
BUN: 12 mg/dL (ref 8–23)
CO2: 23 mmol/L (ref 22–32)
Calcium: 8.4 mg/dL — ABNORMAL LOW (ref 8.9–10.3)
Chloride: 104 mmol/L (ref 98–111)
Creatinine, Ser: 0.61 mg/dL (ref 0.44–1.00)
GFR calc Af Amer: >60 mL/min (ref >60)
GFR calc non Af Amer: >60 mL/min (ref >60)
Glucose, Bld: 178 mg/dL — ABNORMAL HIGH (ref 70–99)
Potassium: 3.6 mmol/L (ref 3.5–5.1)
Sodium: 138 mmol/L (ref 135–145)
Total Bilirubin: 0.4 mg/dL (ref 0.3–1.2)
Total Protein: 6.3 g/dL — ABNORMAL LOW (ref 6.5–8.1)

## 2018-05-12 LAB — BRAIN NATRIURETIC PEPTIDE: B Natriuretic Peptide: 120 pg/mL — ABNORMAL HIGH (ref 0.0–100.0)

## 2018-05-12 LAB — CBG MONITORING, ED: Glucose-Capillary: 252 mg/dL — ABNORMAL HIGH (ref 70–99)

## 2018-05-12 LAB — INFLUENZA PANEL BY PCR (TYPE A & B)
Influenza A By PCR: POSITIVE — AB
Influenza B By PCR: NEGATIVE

## 2018-05-12 LAB — TROPONIN I
Troponin I: <0.03 ng/mL (ref <0.03)
Troponin I: <0.03 ng/mL (ref <0.03)

## 2018-05-12 MED ORDER — GABAPENTIN 300 MG PO CAPS
300.0000 mg | ORAL_CAPSULE | Freq: Three times a day (TID) | ORAL | Status: DC
  Administered 2018-05-12 – 2018-05-14 (×6): 300 mg via ORAL
  Filled 2018-05-12 (×6): qty 1

## 2018-05-12 MED ORDER — OSELTAMIVIR PHOSPHATE 75 MG PO CAPS
75.0000 mg | ORAL_CAPSULE | Freq: Two times a day (BID) | ORAL | Status: DC
  Administered 2018-05-13 – 2018-05-14 (×4): 75 mg via ORAL
  Filled 2018-05-12 (×4): qty 1

## 2018-05-12 MED ORDER — CITALOPRAM HYDROBROMIDE 20 MG PO TABS
20.0000 mg | ORAL_TABLET | Freq: Every day | ORAL | Status: DC
  Administered 2018-05-12 – 2018-05-14 (×3): 20 mg via ORAL
  Filled 2018-05-12 (×5): qty 1

## 2018-05-12 MED ORDER — SODIUM CHLORIDE 0.9 % IV SOLN: 500.0000 mg | Freq: Once | INTRAVENOUS | Status: DC

## 2018-05-12 MED ORDER — AZITHROMYCIN 250 MG PO TABS: 250.0000 mg | ORAL_TABLET | ORAL | Status: DC

## 2018-05-12 MED ORDER — METHYLPREDNISOLONE SODIUM SUCC 125 MG IJ SOLR
60.0000 mg | Freq: Three times a day (TID) | INTRAMUSCULAR | Status: DC
  Administered 2018-05-13 – 2018-05-14 (×5): 60 mg via INTRAVENOUS
  Filled 2018-05-12 (×5): qty 2

## 2018-05-12 MED ORDER — ACETAMINOPHEN 325 MG PO TABS: 650.0000 mg | ORAL_TABLET | Freq: Four times a day (QID) | ORAL | Status: DC | PRN

## 2018-05-12 MED ORDER — DM-GUAIFENESIN ER 30-600 MG PO TB12
2.0000 | ORAL_TABLET | Freq: Two times a day (BID) | ORAL | Status: DC
  Administered 2018-05-12 – 2018-05-14 (×4): 2 via ORAL
  Filled 2018-05-12 (×4): qty 2

## 2018-05-12 MED ORDER — ENOXAPARIN SODIUM 40 MG/0.4ML ~~LOC~~ SOLN
40.0000 mg | SUBCUTANEOUS | Status: DC
  Administered 2018-05-12 – 2018-05-13 (×2): 40 mg via SUBCUTANEOUS
  Filled 2018-05-12 (×2): qty 0.4

## 2018-05-12 MED ORDER — BUSPIRONE HCL 5 MG PO TABS
10.0000 mg | ORAL_TABLET | Freq: Two times a day (BID) | ORAL | Status: DC
  Administered 2018-05-12 – 2018-05-14 (×4): 10 mg via ORAL
  Filled 2018-05-12 (×4): qty 2

## 2018-05-12 MED ORDER — LEVALBUTEROL HCL 0.63 MG/3ML IN NEBU
0.6300 mg | INHALATION_SOLUTION | Freq: Once | RESPIRATORY_TRACT | Status: AC
  Administered 2018-05-12: 0.63 mg via RESPIRATORY_TRACT
  Filled 2018-05-12: qty 3

## 2018-05-12 MED ORDER — LEVOFLOXACIN IN D5W 500 MG/100ML IV SOLN: 500.0000 mg | INTRAVENOUS | Status: DC

## 2018-05-12 MED ORDER — INSULIN ASPART 100 UNIT/ML ~~LOC~~ SOLN: 0.0000 [IU] | Freq: Every day | SUBCUTANEOUS | Status: DC

## 2018-05-12 MED ORDER — ENALAPRIL MALEATE 5 MG PO TABS
20.0000 mg | ORAL_TABLET | Freq: Every day | ORAL | Status: DC
  Administered 2018-05-12 – 2018-05-14 (×3): 20 mg via ORAL
  Filled 2018-05-12 (×2): qty 1
  Filled 2018-05-12 (×3): qty 4

## 2018-05-12 MED ORDER — IPRATROPIUM BROMIDE 0.02 % IN SOLN
0.5000 mg | Freq: Four times a day (QID) | RESPIRATORY_TRACT | Status: DC
  Administered 2018-05-12 – 2018-05-14 (×8): 0.5 mg via RESPIRATORY_TRACT
  Filled 2018-05-12 (×8): qty 2.5

## 2018-05-12 MED ORDER — INSULIN ASPART 100 UNIT/ML ~~LOC~~ SOLN
0.0000 [IU] | Freq: Three times a day (TID) | SUBCUTANEOUS | Status: DC
  Administered 2018-05-13 (×3): 3 [IU] via SUBCUTANEOUS
  Administered 2018-05-14 (×3): 5 [IU] via SUBCUTANEOUS

## 2018-05-12 MED ORDER — OSELTAMIVIR PHOSPHATE 75 MG PO CAPS
75.0000 mg | ORAL_CAPSULE | Freq: Once | ORAL | Status: AC
  Administered 2018-05-12: 75 mg via ORAL
  Filled 2018-05-12: qty 1

## 2018-05-12 MED ORDER — LEVALBUTEROL HCL 0.63 MG/3ML IN NEBU
0.6300 mg | INHALATION_SOLUTION | Freq: Four times a day (QID) | RESPIRATORY_TRACT | Status: DC
  Administered 2018-05-12 – 2018-05-14 (×8): 0.63 mg via RESPIRATORY_TRACT
  Filled 2018-05-12 (×8): qty 3

## 2018-05-12 MED ORDER — DOXYCYCLINE HYCLATE 100 MG PO TABS
200.0000 mg | ORAL_TABLET | Freq: Every day | ORAL | Status: DC
  Administered 2018-05-12 – 2018-05-14 (×3): 200 mg via ORAL
  Filled 2018-05-12 (×3): qty 2

## 2018-05-12 MED ORDER — PRAVASTATIN SODIUM 40 MG PO TABS
40.0000 mg | ORAL_TABLET | Freq: Every day | ORAL | Status: DC
  Administered 2018-05-12 – 2018-05-14 (×3): 40 mg via ORAL
  Filled 2018-05-12 (×4): qty 1

## 2018-05-12 NOTE — ED Provider Notes (Signed)
[facility] EMERGENCY DEPARTMENT Provider Note   CSN: [id number] Arrival date & time: 05/12/18  1637     History   Chief Complaint Chief Complaint  Patient presents with  . Shortness of Breath    HPI [name] is a 68 y.o. female.  HPI Patient has severe COPD and is wearing 4 L of supplemental oxygen continuously.  Over the last 3 days she is had increasing shortness of breath with cough productive of yellow sputum.  She has had subjective fevers and chills.  Was evaluated at outside clinic and referred to the emergency department.  Currently denying any pain.  No known sick contacts.  No new lower extremity swelling or pain. Past Medical History:  Diagnosis Date  . Arthritis    "hands, knees, feet"  . Asthma   . Colon polyps   . COPD (chronic obstructive pulmonary disease) ([place])   . Hyperlipidemia   . Hypertension   . Peripheral edema   . Pneumonia 08/2010; 11/2010  . SBO (small bowel obstruction) ([place]) 08/25/11   recurrent  . Type II diabetes mellitus [facility])     Patient Active Problem List   Diagnosis Date Noted  . COPD exacerbation ([place]) 11/06/2017  . Back pain 11/06/2017  . Pulmonary nodule, right 11/06/2017  . History of colonic polyps 07/25/2015  . FH: colon cancer 07/25/2015  . Incarcerated ventral hernia 04/27/2013  . SBO (small bowel obstruction) ([place]) 08/25/2011  . Hypoxia 08/25/2011  . Leucocytosis 08/25/2011  . HTN (hypertension) 08/25/2011  . COPD (chronic obstructive pulmonary disease) ([place]) 08/25/2011  . TRIGGER FINGER 06/11/2009  . KNEE, ARTHRITIS, DEGEN./OSTEO 09/08/2007  . TIBIALIS TENDINITIS 02/23/2007  . Type 2 diabetes mellitus without complication ([place]) 17/51/0258    Past Surgical History:  Procedure Laterality Date  . APPENDECTOMY    . CHOLECYSTECTOMY  1979  . COLONOSCOPY  2008   Dr. Oneida Alar: 46mm sessile cecal polyp and 35mm sessile transverse colon polyp, torturous sigmoid colon, fragments of tubular adenomas on path   . COLONOSCOPY  WITH PROPOFOL N/A 08/13/2015   Procedure: COLONOSCOPY WITH PROPOFOL;  Surgeon: Danie Binder, MD;  Location: [facility] ENDO SUITE;  Service: Endoscopy;  Laterality: N/A;  5277  . ESOPHAGEAL DILATION     remote past by Dr. Tamala Julian  . HERNIA REPAIR    . KNEE CARTILAGE SURGERY  07/2003   right/E-chart  . LAPAROSCOPIC INCISIONAL / UMBILICAL / VENTRAL HERNIA REPAIR  ? til 2002   "~ 13 times"  . POLYPECTOMY  08/13/2015   Procedure: POLYPECTOMY;  Surgeon: Danie Binder, MD;  Location: [facility] ENDO SUITE;  Service: Endoscopy;;  ascending colon polyp, hepatic flexure polyp, transverse colon polyp, descending colon polyp, sigmoid colon polyp, rectal polyps times 3  . SMALL INTESTINE SURGERY     Feb 2015 at [facility] and then remote past   . [place]     OB History    Gravida      Para      Term      Preterm      AB      Living  1     SAB      TAB      Ectopic      Multiple      Live Births               Home Medications    Prior to Admission medications   Medication Sig Start Date End Date Taking? Authorizing Provider  albuterol (  PROVENTIL HFA;VENTOLIN HFA) 108 (90 BASE) MCG/ACT inhaler Inhale 2 puffs into the lungs every 4 (four) hours as needed. For shortness of breath   Yes [provider]  busPIRone (BUSPAR) 10 MG tablet Take 10 mg by mouth 2 (two) times daily.  05/31/15  Yes [provider]  citalopram (CELEXA) 20 MG tablet Take 20 mg by mouth daily. 11/04/17  Yes [provider]  enalapril (VASOTEC) 20 MG tablet Take 20 mg by mouth daily.   Yes [provider]  Fluticasone-Salmeterol (ADVAIR DISKUS) 250-50 MCG/DOSE AEPB Inhale 1 puff into the lungs 2 (two) times daily. 11/07/17 11/07/18 Yes Buriev, Arie Sabina, MD  gabapentin (NEURONTIN) 300 MG capsule Take 300 mg by mouth 3 (three) times daily.  02/03/14  Yes [provider]  glipiZIDE (GLUCOTROL) 10 MG tablet Take 10 mg by mouth 2 (two) times daily before a meal.  05/05/18  Yes  [provider]  lovastatin (MEVACOR) 40 MG tablet Take 40 mg by mouth at bedtime.   Yes [provider]  meloxicam (MOBIC) 15 MG tablet Take 15 mg by mouth daily. 10/12/17  Yes [provider]  metFORMIN (GLUCOPHAGE) 500 MG tablet Take 500 mg by mouth 2 (two) times daily with a meal.  05/05/18  Yes [provider]  tiotropium (SPIRIVA HANDIHALER) 18 MCG inhalation capsule Place 1 capsule (18 mcg total) into inhaler and inhale daily. 11/07/17 11/07/18 Yes Buriev, Arie Sabina, MD  predniSONE (DELTASONE) 20 MG tablet Take 2 tablets (40 mg total) by mouth daily with breakfast. Patient not taking: Reported on 05/12/2018 11/07/17   Kinnie Feil, MD  sitaGLIPtin-metformin (JANUMET) 50-500 MG tablet Take 1 tablet by mouth 2 (two) times daily.     [provider]  tiZANidine (ZANAFLEX) 4 MG tablet 4 mg.  03/08/18   [provider]  traMADol (ULTRAM) 50 MG tablet Take 50 mg by mouth every 8 (eight) hours as needed for moderate pain.    [provider]    Family History Family History  Problem Relation Age of Onset  . Colon cancer Mother 38    Social History Social History   Tobacco Use  . Smoking status: Former Smoker    Packs/day: 1.00    Years: 42.00    Pack years: 42.00    Types: Cigarettes  . Smokeless tobacco: Former Systems developer    Quit date: 10/30/2014  Substance Use Topics  . Alcohol use: No    Alcohol/week: 0.0 standard drinks  . Drug use: No     Allergies   Hydrocodone-acetaminophen; Penicillins; and Vicodin [hydrocodone-acetaminophen]   Review of Systems Review of Systems  Constitutional: Positive for chills, fatigue and fever.  HENT: Positive for congestion.   Respiratory: Positive for cough and shortness of breath.   Cardiovascular: Negative for chest pain and leg swelling.  Gastrointestinal: Positive for nausea. Negative for abdominal pain and diarrhea.  Musculoskeletal: Negative for back pain, myalgias and neck  pain.  Skin: Negative for rash.  Neurological: Negative for dizziness, weakness, light-headedness, numbness and headaches.  All other systems reviewed and are negative.    Physical Exam Updated Vital Signs BP 124/70   Pulse (!) 118   Temp 99 F (37.2 C) (Oral)   Resp (!) 35   Ht 5\' 4"  (1.626 m)   Wt 83 kg   SpO2 93%   BMI 31.41 kg/m   Physical Exam Vitals signs and nursing note reviewed.  Constitutional:      Appearance: She is well-developed.  Comments: Appears fatigued  HENT:     Head: Normocephalic and atraumatic.     Mouth/Throat:     Mouth: Mucous membranes are moist.     Pharynx: No oropharyngeal exudate or posterior oropharyngeal erythema.  Eyes:     Extraocular Movements: Extraocular movements intact.     Pupils: Pupils are equal, round, and reactive to light.  Neck:     Musculoskeletal: Normal range of motion and neck supple. No neck rigidity or muscular tenderness.  Cardiovascular:     Rate and Rhythm: Regular rhythm. Tachycardia present.  Pulmonary:     Breath sounds: Wheezing present.     Comments: Tachypnea.  Expiratory wheezing and diminished breath sounds throughout. Abdominal:     General: Bowel sounds are normal. There is no distension.     Palpations: Abdomen is soft.     Tenderness: There is no abdominal tenderness. There is no guarding or rebound.  Musculoskeletal: Normal range of motion.        General: No swelling, tenderness or deformity.     Right lower leg: No edema.  Lymphadenopathy:     Cervical: No cervical adenopathy.  Skin:    General: Skin is warm and dry.     Findings: No erythema or rash.  Neurological:     General: No focal deficit present.     Mental Status: She is alert and oriented to person, place, and time.  Psychiatric:        Behavior: Behavior normal.      ED Treatments / Results  Labs (all labs ordered are listed, but only abnormal results are displayed) Labs Reviewed  CBC WITH DIFFERENTIAL/PLATELET -  Abnormal; Notable for the following components:      Result Value   Platelets 116 (*)    Lymphs Abs 0.5 (*)    All other components within normal limits  COMPREHENSIVE METABOLIC PANEL - Abnormal; Notable for the following components:   Glucose, Bld 178 (*)    Calcium 8.4 (*)    Total Protein 6.3 (*)    All other components within normal limits  BRAIN NATRIURETIC PEPTIDE - Abnormal; Notable for the following components:   B Natriuretic Peptide 120.0 (*)    All other components within normal limits  INFLUENZA PANEL BY PCR (TYPE A & B) - Abnormal; Notable for the following components:   Influenza A By PCR POSITIVE (*)    All other components within normal limits  TROPONIN I    EKG EKG Interpretation  Date/Time:  Thursday May 12 2018 16:37:55 EST Ventricular Rate:  121 PR Interval:    QRS Duration: 92 QT Interval:  359 QTC Calculation: 510 R Axis:   105 Text Interpretation:  Sinus tachycardia Right axis deviation Borderline low voltage, extremity leads Borderline ST depression, diffuse leads Prolonged QT interval Confirmed by Julianne Rice [id number]) on 05/12/2018 4:56:42 PM   Radiology Dg Chest Portable 1 View  Result Date: 05/12/2018 CLINICAL DATA:  Shortness of breath, decreased lung sounds and low oxygen saturation of 84%, history asthma, COPD, hypertension, type II diabetes mellitus EXAM: PORTABLE CHEST 1 VIEW COMPARISON:  Portable exam 1657 hours compared to 11/06/2017 FINDINGS: Normal heart size, mediastinal contours, and pulmonary vascularity. Lungs clear. Minimal chronic peribronchial thickening. No infiltrate, pleural effusion or pneumothorax. Bones demineralized. IMPRESSION: Minimal chronic bronchitic changes without infiltrate. Electronically Signed   By: Lavonia Dana M.D.   On: 05/12/2018 17:14    Procedures Procedures (including critical care time)  Medications Ordered in ED Medications  levalbuterol (  XOPENEX) nebulizer solution 0.63 mg (0.63 mg Nebulization  Given 05/12/18 1714)  oseltamivir (TAMIFLU) capsule 75 mg (75 mg Oral Given 05/12/18 1839)     Initial Impression / Assessment and Plan / ED Course  I have reviewed the triage vital signs and the nursing notes.  Pertinent labs & imaging results that were available during my care of the patient were reviewed by me and considered in my medical decision making (see chart for details).     Patient with tachycardia presumably from breathing treatments received prior to presenting to the emergency department and underlying infectious illness.  She did receive a Xopenex treatment with improvement in her work of breathing.  She is flu positive.  Initiated Tamiflu treatment.  Discussed with hospitalist who will admit.  Final Clinical Impressions(s) / ED Diagnoses   Final diagnoses:  COPD exacerbation [facility])  Influenza A    ED Discharge Orders    None       Julianne Rice, MD 05/12/18 1857

## 2018-05-12 NOTE — H&P (Addendum)
History and Physical    [name] DOB: [birth year] DOA: 05/12/2018  PCP: Redmond School, MD  Chief Complaint: Shortness of breath  HPI: [name] is a 68 y.o. female with medical history significant of COPD on 4 L home oxygen, hypertension, hyperlipidemia, type 2 diabetes presenting from [place] for evaluation of shortness of breath.  Oxygen saturation 84% on her usual 4 L home oxygen.  Patient was given Solu-Medrol 125 mg and albuterol 7.5 mg in route to the hospital.  Patient reports having shortness of breath, cough productive of sputum, and wheezing for the past 2 days.  States she was previously using 4 L home oxygen as needed but has required it continuously for the past few days.  Reports compliance with home inhalers.  Reports having fevers, chills, body aches, and fatigue.  States she vomited earlier today.  Denies having any nausea at present.  Denies having any abdominal pain, diarrhea, or constipation.  No recent sick contacts.  Denies having any chest pain.  Review of Systems: As per HPI otherwise 10 point review of systems negative.  Past Medical History:  Diagnosis Date  . Arthritis    "hands, knees, feet"  . Asthma   . Colon polyps   . COPD (chronic obstructive pulmonary disease) ([place])   . Hyperlipidemia   . Hypertension   . Peripheral edema   . Pneumonia 08/2010; 11/2010  . SBO (small bowel obstruction) ([place]) 08/25/11   recurrent  . Type II diabetes mellitus ([place])     Past Surgical History:  Procedure Laterality Date  . APPENDECTOMY    . CHOLECYSTECTOMY  1979  . COLONOSCOPY  2008   Dr. Oneida Alar: 41mm sessile cecal polyp and 84mm sessile transverse colon polyp, torturous sigmoid colon, fragments of tubular adenomas on path   . COLONOSCOPY WITH PROPOFOL N/A 08/13/2015   Procedure: COLONOSCOPY WITH PROPOFOL;  Surgeon: Danie Binder, MD;  Location: [facility] ENDO SUITE;  Service: Endoscopy;  Laterality: N/A;  2725  . ESOPHAGEAL DILATION      remote past by Dr. Tamala Julian  . HERNIA REPAIR    . KNEE CARTILAGE SURGERY  07/2003   right/E-chart  . LAPAROSCOPIC INCISIONAL / UMBILICAL / VENTRAL HERNIA REPAIR  ? til 2002   "~ 13 times"  . POLYPECTOMY  08/13/2015   Procedure: POLYPECTOMY;  Surgeon: Danie Binder, MD;  Location: [facility]O SU[facility];  Service: Endoscopy;;  ascending colon polyp, hepatic flexure polyp, transverse colon polyp, descending colon polyp, sigmoid colon polyp, rectal polyps times 3  . SMALL INTESTINE SURGERY     Feb 2015 at [facility] and then remote past   . [place]     reports that she has quit smoking. Her smoking use included cigarettes. She has a 42.00 pack-year smoking history. She quit smokeless tobacco use about 3 years ago. She reports that she does not drink alcohol or use drugs.  Allergies  Allergen Reactions  . Hydrocodone-Acetaminophen Other (See Comments)    Itching. Pt states she can tolerate acetaminophen  . Penicillins Hives and Itching    Has patient had a PCN reaction causing immediate rash, facial/tongue/throat swelling, SOB or lightheadedness with hypotension: Yes Has patient had a PCN reaction causing severe rash involving mucus membranes or skin necrosis: No Has patient had a PCN reaction that required hospitalization No Has patient had a PCN reaction occurring within the last 10 years: No If all of the above answers are "NO", then may proceed with Cephalosporin  use.  Has patient had a PCN reaction causing immediate rash, facial/tongue/throat swelling, SOB or lightheadedness with hypotension: Yes Has patient had a PCN reaction causing severe rash involving mucus membranes or skin necrosis: No Has patient had a PCN reaction that required hospitalization No Has patient had a PCN reaction occurring within the last 10 years: No If all of the above answers are "NO", then may proceed with Cephalosporin use. Can tolerate cephalosporins, per pt Electronically signed by: Rudene Christians, [facility]  06/07/2013 10:43 AM  . Vicodin [Hydrocodone-Acetaminophen]     itching    Family History  Problem Relation Age of Onset  . Colon cancer Mother 31    Prior to Admission medications   Medication Sig Start Date End Date Taking? Authorizing Provider  albuterol (PROVENTIL HFA;VENTOLIN HFA) 108 (90 BASE) MCG/ACT inhaler Inhale 2 puffs into the lungs every 4 (four) hours as needed. For shortness of breath   Yes [provider]  busPIRone (BUSPAR) 10 MG tablet Take 10 mg by mouth 2 (two) times daily.  05/31/15  Yes [provider]  citalopram (CELEXA) 20 MG tablet Take 20 mg by mouth daily. 11/04/17  Yes [provider]  enalapril (VASOTEC) 20 MG tablet Take 20 mg by mouth daily.   Yes [provider]  Fluticasone-Salmeterol (ADVAIR DISKUS) 250-50 MCG/DOSE AEPB Inhale 1 puff into the lungs 2 (two) times daily. 11/07/17 11/07/18 Yes Buriev, Arie Sabina, MD  gabapentin (NEURONTIN) 300 MG capsule Take 300 mg by mouth 3 (three) times daily.  02/03/14  Yes [provider]  glipiZIDE (GLUCOTROL) 10 MG tablet Take 10 mg by mouth 2 (two) times daily before a meal.  05/05/18  Yes [provider]  lovastatin (MEVACOR) 40 MG tablet Take 40 mg by mouth at bedtime.   Yes [provider]  meloxicam (MOBIC) 15 MG tablet Take 15 mg by mouth daily. 10/12/17  Yes [provider]  metFORMIN (GLUCOPHAGE) 500 MG tablet Take 500 mg by mouth 2 (two) times daily with a meal.  05/05/18  Yes [provider]  tiotropium (SPIRIVA HANDIHALER) 18 MCG inhalation capsule Place 1 capsule (18 mcg total) into inhaler and inhale daily. 11/07/17 11/07/18 Yes Buriev, Arie Sabina, MD  predniSONE (DELTASONE) 20 MG tablet Take 2 tablets (40 mg total) by mouth daily with breakfast. Patient not taking: Reported on 05/12/2018 11/07/17   Kinnie Feil, MD  sitaGLIPtin-metformin (JANUMET) 50-500 MG tablet Take 1 tablet by mouth 2 (two) times daily.     [provider]   tiZANidine (ZANAFLEX) 4 MG tablet 4 mg.  03/08/18   [provider]  traMADol (ULTRAM) 50 MG tablet Take 50 mg by mouth every 8 (eight) hours as needed for moderate pain.    [provider]    Physical Exam: Vitals:   05/12/18 1920 05/12/18 1927 05/12/18 1930 05/12/18 2015  BP:      Pulse:   (!) 102 96  Resp:   (!) 26 (!) 26  Temp:      TempSrc:      SpO2: 93% 93% 92% 92%  Weight:      Height:        Physical Exam  Constitutional: She is oriented to person, place, and time.  Appears weak and lethargic  HENT:  Head: Normocephalic.  Mouth/Throat: Oropharynx is clear and moist.  Eyes: Right eye exhibits no discharge. Left eye exhibits no discharge.  Neck: Neck supple.  Cardiovascular: Normal rate, regular rhythm and intact distal pulses.  Pulmonary/Chest: Effort normal. She has no wheezes. She has rales.  On 5 L supplemental oxygen Mildly increased work of breathing Decreased air entry bilaterally Very mild bibasilar rales  Abdominal: Soft. Bowel sounds are normal. She exhibits no distension. There is no abdominal tenderness. There is no guarding.  Musculoskeletal:        General: No edema.  Neurological: She is alert and oriented to person, place, and time.  Skin: Skin is dry. She is not diaphoretic.  Very warm to touch     Labs on Admission: I have personally reviewed following labs and imaging studies  CBC: Recent Labs  Lab 05/12/18 1651  WBC 5.6  NEUTROABS 4.7  HGB 14.3  HCT 45.7  MCV 94.2  PLT 932*   Basic Metabolic Panel: Recent Labs  Lab 05/12/18 1651  NA 138  K 3.6  CL 104  CO2 23  GLUCOSE 178*  BUN 12  CREATININE 0.61  CALCIUM 8.4*   GFR: Estimated Creatinine Clearance: 73 mL/min (by C-G formula based on SCr of 0.61 mg/dL). Liver Function Tests: Recent Labs  Lab 05/12/18 1651  AST 19  ALT 15  ALKPHOS 53  BILITOT 0.4  PROT 6.3*  ALBUMIN 3.6   No results for input(s): LIPASE, AMYLASE in the last 168 hours. No  results for input(s): AMMONIA in the last 168 hours. Coagulation Profile: No results for input(s): INR, PROTIME in the last 168 hours. Cardiac Enzymes: Recent Labs  Lab 05/12/18 1651  TROPONINI <0.03   BNP (last 3 results) No results for input(s): PROBNP in the last 8760 hours. HbA1C: No results for input(s): HGBA1C in the last 72 hours. CBG: No results for input(s): GLUCAP in the last 168 hours. Lipid Profile: No results for input(s): CHOL, HDL, LDLCALC, TRIG, CHOLHDL, LDLDIRECT in the last 72 hours. Thyroid Function Tests: No results for input(s): TSH, T4TOTAL, FREET4, T3FREE, THYROIDAB in the last 72 hours. Anemia Panel: No results for input(s): VITAMINB12, FOLATE, FERRITIN, TIBC, IRON, RETICCTPCT in the last 72 hours. Urine analysis:    Component Value Date/Time   COLORURINE YELLOW 11/06/2017 1640   APPEARANCEUR HAZY (A) 11/06/2017 1640   LABSPEC 1.008 11/06/2017 1640   PHURINE 5.0 11/06/2017 1640   GLUCOSEU NEGATIVE 11/06/2017 1640   HGBUR NEGATIVE 11/06/2017 1640   BILIRUBINUR NEGATIVE 11/06/2017 1640   KETONESUR NEGATIVE 11/06/2017 1640   PROTEINUR NEGATIVE 11/06/2017 1640   UROBILINOGEN 0.2 01/04/2011 0914   NITRITE POSITIVE (A) 11/06/2017 1640   LEUKOCYTESUR SMALL (A) 11/06/2017 1640    Radiological Exams on Admission: Dg Chest Portable 1 View  Result Date: 05/12/2018 CLINICAL DATA:  Shortness of breath, decreased lung sounds and low oxygen saturation of 84%, history asthma, COPD, hypertension, type II diabetes mellitus EXAM: PORTABLE CHEST 1 VIEW COMPARISON:  Portable exam 1657 hours compared to 11/06/2017 FINDINGS: Normal heart size, mediastinal contours, and pulmonary vascularity. Lungs clear. Minimal chronic peribronchial thickening. No infiltrate, pleural effusion or pneumothorax. Bones demineralized. IMPRESSION: Minimal chronic bronchitic changes without infiltrate. Electronically Signed   By: Lavonia Dana M.D.   On: 05/12/2018 17:14    EKG: Independently  reviewed.  Sinus tachycardia (heart rate 121), QTc 510, ST segment abnormality in inferior leads.  Assessment/Plan Principal Problem:   Acute on chronic respiratory failure with hypoxia (HCC) Active Problems:   Type 2 diabetes mellitus without complication (HCC)   HTN (hypertension)   COPD exacerbation (HCC)   Influenza A   HLD (hyperlipidemia)   Thrombocytopenia (HCC)   Acute on chronic hypoxic respiratory failure  secondary to acute exacerbation of chronic obstructive pulmonary disease likely precipitated by influenza A infection -At PCPs office, oxygen saturation 84% on her usual 4 L home oxygen.  Tachycardic and tachypneic on arrival.   -BNP not impressive.  Troponin negative.  Influenza A positive.  Chest x-ray showing minimal chronic bronchitic changes without infiltrate. -Wheezing on exam done by ED provider and received nebulizer treatments.  Currently not wheezing but continues to have decreased air entry bilaterally and slightly increased work of breathing. Maintaining oxygen saturation above 92% on 4 to 5 L supplemental oxygen.  Noted to be coughing. -Received Solu-Medrol 125 mg by EMS.  Continue Solu-Medrol 60 mg every 8 hours. -Levalbuterol-ipratropium every 6 hours -Tamiflu 75 mg twice daily; droplet precautions -Doxycycline to cover for possible bacterial source.  Patient has a penicillin allergy.  Avoid fluoroquinolones and macrolides due to QT prolongation. -Supplemental oxygen -Tylenol PRN -Mucinex DM  Abnormal EKG EKG showing sinus tachycardia (heart rate 121) with ST segment abnormality in inferior leads, possibly rate related.  Troponin negative.  Patient denies having any chest pain. -Cardiac monitoring -Trend troponin  Thrombocytopenia Platelet count 116,000, previously normal.  No signs of active bleeding. -Continue to monitor CBC  Type 2 diabetes -Check A1c -Sliding scale insulin sensitive -CBG checks  Chronic pain/neuropathy -Continue home  gabapentin  Hypertension -Blood pressure stable.  Continue home enalapril.  Depression -Continue home Celexa, BuSpar  Hyperlipidemia -Continue statin  DVT prophylaxis: Lovenox Code Status: Discussed with the patient.  Limited code (no intubation or mechanical ventilation). Family Communication: No family available. Disposition Plan: Anticipate discharge in 1 to 2 days. Consults called: None Admission status: Observation   Shela Leff MD [facility] Pager [phone]  If 7PM-7AM, please contact night-coverage [email or web address] Password [email or web address]  05/12/2018, 8:22 PM

## 2018-05-12 NOTE — ED Notes (Signed)
Received call from registration that pt has left with spouse.

## 2018-05-12 NOTE — ED Triage Notes (Signed)
PT sent to ED today via EMS from [facility] due to SOB decreased lung sounds and low oxygen saturation of 84% on her usual 4L of oxygen. PT was given 125mg  of solumedrol and 7.5mg  of albuterol in route.

## 2018-05-13 DIAGNOSIS — Z880 Allergy status to penicillin: Secondary | ICD-10-CM | POA: Diagnosis not present

## 2018-05-13 DIAGNOSIS — T380X5A Adverse effect of glucocorticoids and synthetic analogues, initial encounter: Secondary | ICD-10-CM | POA: Diagnosis present

## 2018-05-13 DIAGNOSIS — Z8719 Personal history of other diseases of the digestive system: Secondary | ICD-10-CM | POA: Diagnosis not present

## 2018-05-13 DIAGNOSIS — Z79891 Long term (current) use of opiate analgesic: Secondary | ICD-10-CM | POA: Diagnosis not present

## 2018-05-13 DIAGNOSIS — E1165 Type 2 diabetes mellitus with hyperglycemia: Secondary | ICD-10-CM | POA: Diagnosis present

## 2018-05-13 DIAGNOSIS — Z8701 Personal history of pneumonia (recurrent): Secondary | ICD-10-CM | POA: Diagnosis not present

## 2018-05-13 DIAGNOSIS — Z7984 Long term (current) use of oral hypoglycemic drugs: Secondary | ICD-10-CM | POA: Diagnosis not present

## 2018-05-13 DIAGNOSIS — Z86010 Personal history of colonic polyps: Secondary | ICD-10-CM | POA: Diagnosis not present

## 2018-05-13 DIAGNOSIS — E785 Hyperlipidemia, unspecified: Secondary | ICD-10-CM | POA: Diagnosis present

## 2018-05-13 DIAGNOSIS — Z716 Tobacco abuse counseling: Secondary | ICD-10-CM | POA: Diagnosis not present

## 2018-05-13 DIAGNOSIS — Z9049 Acquired absence of other specified parts of digestive tract: Secondary | ICD-10-CM | POA: Diagnosis not present

## 2018-05-13 DIAGNOSIS — J9621 Acute and chronic respiratory failure with hypoxia: Secondary | ICD-10-CM

## 2018-05-13 DIAGNOSIS — J441 Chronic obstructive pulmonary disease with (acute) exacerbation: Secondary | ICD-10-CM | POA: Diagnosis present

## 2018-05-13 DIAGNOSIS — Z885 Allergy status to narcotic agent status: Secondary | ICD-10-CM | POA: Diagnosis not present

## 2018-05-13 DIAGNOSIS — G8929 Other chronic pain: Secondary | ICD-10-CM | POA: Diagnosis present

## 2018-05-13 DIAGNOSIS — X58XXXA Exposure to other specified factors, initial encounter: Secondary | ICD-10-CM | POA: Diagnosis present

## 2018-05-13 DIAGNOSIS — M8949 Other hypertrophic osteoarthropathy, multiple sites: Secondary | ICD-10-CM | POA: Diagnosis present

## 2018-05-13 DIAGNOSIS — E1140 Type 2 diabetes mellitus with diabetic neuropathy, unspecified: Secondary | ICD-10-CM | POA: Diagnosis present

## 2018-05-13 DIAGNOSIS — Z7951 Long term (current) use of inhaled steroids: Secondary | ICD-10-CM | POA: Diagnosis not present

## 2018-05-13 DIAGNOSIS — F329 Major depressive disorder, single episode, unspecified: Secondary | ICD-10-CM | POA: Diagnosis present

## 2018-05-13 DIAGNOSIS — J101 Influenza due to other identified influenza virus with other respiratory manifestations: Secondary | ICD-10-CM | POA: Diagnosis present

## 2018-05-13 DIAGNOSIS — Z800 Family history of malignant neoplasm of digestive organs: Secondary | ICD-10-CM | POA: Diagnosis not present

## 2018-05-13 DIAGNOSIS — I10 Essential (primary) hypertension: Secondary | ICD-10-CM | POA: Diagnosis present

## 2018-05-13 DIAGNOSIS — Z79899 Other long term (current) drug therapy: Secondary | ICD-10-CM | POA: Diagnosis not present

## 2018-05-13 DIAGNOSIS — F17210 Nicotine dependence, cigarettes, uncomplicated: Secondary | ICD-10-CM | POA: Diagnosis present

## 2018-05-13 LAB — GLUCOSE, CAPILLARY
GLUCOSE-CAPILLARY: 235 mg/dL — AB (ref 70–99)
Glucose-Capillary: 217 mg/dL — ABNORMAL HIGH (ref 70–99)
Glucose-Capillary: 245 mg/dL — ABNORMAL HIGH (ref 70–99)
Glucose-Capillary: 231 mg/dL — ABNORMAL HIGH (ref 70–99)

## 2018-05-13 LAB — CBC
HCT: 41.5 % (ref 36.0–46.0)
Hemoglobin: 13.2 g/dL (ref 12.0–15.0)
MCH: 30.1 pg (ref 26.0–34.0)
MCHC: 31.8 g/dL (ref 30.0–36.0)
MCV: 94.7 fL (ref 80.0–100.0)
Platelets: 141 10*3/uL — ABNORMAL LOW (ref 150–400)
RBC: 4.38 MIL/uL (ref 3.87–5.11)
RDW: 13.8 % (ref 11.5–15.5)
WBC: 4.8 10*3/uL (ref 4.0–10.5)
nRBC: 0 % (ref 0.0–0.2)

## 2018-05-13 LAB — HEMOGLOBIN A1C
Hgb A1c MFr Bld: 7.4 % — ABNORMAL HIGH (ref 4.8–5.6)
Mean Plasma Glucose: 165.68 mg/dL

## 2018-05-13 LAB — TROPONIN I
Troponin I: <0.03 ng/mL (ref <0.03)
Troponin I: <0.03 ng/mL (ref <0.03)

## 2018-05-13 LAB — MRSA PCR SCREENING: MRSA BY PCR: NEGATIVE

## 2018-05-13 NOTE — Progress Notes (Signed)
Patient Demographics:    [name], is a 68 y.o. female, DOB - 1951/01/15, SLP:[id number]  Admit date - 05/12/2018   Admitting Physician Shela Leff, MD  Outpatient Primary MD for the patient is Redmond School, MD  LOS - 0   Chief Complaint  Patient presents with  . Shortness of Breath        Subjective:    [name] today has no fevers, no emesis,  No chest pain,  *husband at bedside, questions answered  Assessment  & Plan :    Principal Problem:   Acute on chronic respiratory failure with hypoxia ([place]) Active Problems:   Type 2 diabetes mellitus without complication (HCC)   HTN (hypertension)   COPD exacerbation (HCC)   Influenza A   HLD (hyperlipidemia)   Thrombocytopenia (HCC)   Acute on chronic hypoxic respiratory failure secondary to acute exacerbation of chronic obstructive pulmonary diseaselikelyprecipitated byinfluenza A infection------ shortness of breath, cough and wheezing persist, O2 sats high 80s despite being on 4 L of oxygen at rest, c/n Tamiflu, bronchodilators and mucolytics, and steroids   DM2-last A1c 7.4 reflecting fair control, anticipate worsening hyperglycemia due to steroids, Use Novolog/Humalog Sliding scale insulin with Accu-Cheks/Fingersticks as ordered   Chronic pain/neuropathy -Continue home gabapentin  Hypertension -Blood pressure stable. Continue home enalapril.  Depression -Continue home Celexa  Tobacco abuse--smoking cessation strongly advised especially due to concomitant oxygen use   Code Status : Partial  Family Communication:    Husband at bedside   Disposition Plan  : home  RecentLabs       Lab Results  Component Value Date   PLT 141 (L) 05/13/2018    DVT Prophylaxis  :  Lovenox -   Lab Results  Component Value Date   PLT 141 (L) 05/13/2018    Inpatient Medications  Scheduled Meds: . busPIRone  10 mg  Oral BID  . citalopram  20 mg Oral Daily  . dextromethorphan-guaiFENesin  2 tablet Oral BID  . doxycycline  200 mg Oral Daily  . enalapril  20 mg Oral Daily  . enoxaparin (LOVENOX) injection  40 mg Subcutaneous Q24H  . gabapentin  300 mg Oral TID  . insulin aspart  0-9 Units Subcutaneous TID WC  . ipratropium  0.5 mg Nebulization Q6H  . levalbuterol  0.63 mg Nebulization Q6H  . methylPREDNISolone (SOLU-MEDROL) injection  60 mg Intravenous Q8H  . oseltamivir  75 mg Oral Q12H  . pravastatin  40 mg Oral q1800   Continuous Infusions: PRN Meds:.acetaminophen    Anti-infectives (From admission, onward)   Start     Dose/Rate Route Frequency Ordered Stop   05/13/18 2100  azithromycin (ZITHROMAX) tablet 250 mg  Status:  Discontinued     250 mg Oral Every 24 hours 05/12/18 2046 05/12/18 2053   05/13/18 0600  oseltamivir (TAMIFLU) capsule 75 mg     75 mg Oral Every 12 hours 05/12/18 1920 05/17/18 1759   05/12/18 2100  azithromycin (ZITHROMAX) 500 mg in sodium chloride 0.9 % 250 mL IVPB  Status:  Discontinued     500 mg 250 mL/hr over 60 Minutes Intravenous  Once 05/12/18 2046 05/12/18 2053   05/12/18 2100  doxycycline (VIBRA-TABS) tablet 200 mg     200 mg Oral  Daily 05/12/18 2055     05/12/18 2045  levofloxacin (LEVAQUIN) IVPB 500 mg  Status:  Discontinued     500 mg 100 mL/hr over 60 Minutes Intravenous Every 24 hours 05/12/18 2043 05/12/18 2045   05/12/18 1815  oseltamivir (TAMIFLU) capsule 75 mg     75 mg Oral  Once 05/12/18 1801 05/12/18 1839        Objective:   Vitals:   05/13/18 1445 05/13/18 1500 05/13/18 1600 05/13/18 1700  BP:  (!) 139/55 (!) 123/54 121/67  Pulse:  (!) 102 85 91  Resp:  (!) 40 (!) 26 13  Temp:      TempSrc:      SpO2: 92% 93% (!) 89% 90%  Weight:      Height:        Wt Readings from Last 3 Encounters:  05/13/18 82.6 kg  03/30/18 83 kg  02/21/18 83 kg    No intake or output data in the 24 hours ending 05/13/18 1859   Physical Exam Patient is  examined daily including today on 05/13/18 , exams remain the same as of yesterday except that has changed   Gen:- Awake Alert, able to speak in short sentences HEENT:- [place].AT, No sclera icterus Neck-Supple Neck,No JVD,.  Nose- [place] 4L/min Lungs-diminished in bases with scattered wheezes  \CV- S1, S2 normal, regular  Abd-  +ve B.Sounds, Abd Soft, No tenderness,    Extremity/Skin:- No  edema, pedal pulses present  Psych-affect is appropriate, oriented x3 Neuro-no new focal deficits, no tremors   Data Review:   Micro Results Recent Results (from the past 240 hour(s))  MRSA PCR Screening     Status: None   Collection Time: 05/12/18 11:05 PM  Result Value Ref Range Status   MRSA by PCR NEGATIVE NEGATIVE Final    Comment:        The GeneXpert MRSA Assay (FDA approved for NASAL specimens only), is one component of a comprehensive MRSA colonization surveillance program. It is not intended to diagnose MRSA infection nor to guide or monitor treatment for MRSA infections. Performed at [facility], [address] [place],  [address]     Radiology Reports Dg Chest Portable 1 View  Result Date: 05/12/2018 CLINICAL DATA:  Shortness of breath, decreased lung sounds and low oxygen saturation of 84%, history asthma, COPD, hypertension, type II diabetes mellitus EXAM: PORTABLE CHEST 1 VIEW COMPARISON:  Portable exam 1657 hours compared to 11/06/2017 FINDINGS: Normal heart size, mediastinal contours, and pulmonary vascularity. Lungs clear. Minimal chronic peribronchial thickening. No infiltrate, pleural effusion or pneumothorax. Bones demineralized. IMPRESSION: Minimal chronic bronchitic changes without infiltrate. Electronically Signed   By: Lavonia Dana M.D.   On: 05/12/2018 17:14     CBC Recent Labs  Lab 05/12/18 1651 05/13/18 0226  WBC 5.6 4.8  HGB 14.3 13.2  HCT 45.7 41.5  PLT 116* 141*  MCV 94.2 94.7  MCH 29.5 30.1  MCHC 31.3 31.8  RDW 13.8 13.8  LYMPHSABS 0.5*  --     MONOABS 0.3  --   EOSABS 0.0  --   BASOSABS 0.0  --     Chemistries  Recent Labs  Lab 05/12/18 1651  NA 138  K 3.6  CL 104  CO2 23  GLUCOSE 178*  BUN 12  CREATININE 0.61  CALCIUM 8.4*  AST 19  ALT 15  ALKPHOS 53  BILITOT 0.4   ------------------------------------------------------------------------------------------------------------------ No results for input(s): CHOL, HDL, LDLCALC, TRIG, CHOLHDL, LDLDIRECT in the last 72 hours.  Lab Results  Component Value Date   HGBA1C 7.4 (H) 05/13/2018   ------------------------------------------------------------------------------------------------------------------ No results for input(s): TSH, T4TOTAL, T3FREE, THYROIDAB in the last 72 hours.  Invalid input(s): FREET3 ------------------------------------------------------------------------------------------------------------------ No results for input(s): VITAMINB12, FOLATE, FERRITIN, TIBC, IRON, RETICCTPCT in the last 72 hours.  Coagulation profile No results for input(s): INR, PROTIME in the last 168 hours.  No results for input(s): DDIMER in the last 72 hours.  Cardiac Enzymes Recent Labs  Lab 05/12/18 2021 05/13/18 0226 05/13/18 1114  TROPONINI <0.03 <0.03 <0.03   ------------------------------------------------------------------------------------------------------------------    Component Value Date/Time   BNP 120.0 (H) 05/12/2018 1651     Roxan Hockey M.D on 05/13/2018 at 6:59 PM  Go to [email or web address] - for contact info  [facility] - Office  [phone]

## 2018-05-14 LAB — GLUCOSE, CAPILLARY
GLUCOSE-CAPILLARY: 283 mg/dL — AB (ref 70–99)
GLUCOSE-CAPILLARY: 262 mg/dL — AB (ref 70–99)
Glucose-Capillary: 266 mg/dL — ABNORMAL HIGH (ref 70–99)

## 2018-05-14 MED ORDER — METFORMIN HCL 1000 MG PO TABS: 1000.0000 mg | ORAL_TABLET | Freq: Two times a day (BID) | ORAL | 2 refills | Status: AC

## 2018-05-14 MED ORDER — DOXYCYCLINE HYCLATE 100 MG PO TABS: 100.0000 mg | ORAL_TABLET | Freq: Two times a day (BID) | ORAL | 0 refills | Status: AC

## 2018-05-14 MED ORDER — ALBUTEROL SULFATE (2.5 MG/3ML) 0.083% IN NEBU: 2.5000 mg | INHALATION_SOLUTION | Freq: Four times a day (QID) | RESPIRATORY_TRACT | 12 refills | Status: AC | PRN

## 2018-05-14 MED ORDER — METHYLPREDNISOLONE SODIUM SUCC 40 MG IJ SOLR: 40.0000 mg | Freq: Three times a day (TID) | INTRAMUSCULAR | Status: DC

## 2018-05-14 MED ORDER — GUAIFENESIN ER 600 MG PO TB12: 600.0000 mg | ORAL_TABLET | Freq: Two times a day (BID) | ORAL | 0 refills | Status: AC

## 2018-05-14 MED ORDER — PREDNISONE 20 MG PO TABS: 40.0000 mg | ORAL_TABLET | Freq: Every day | ORAL | 0 refills | Status: DC

## 2018-05-14 MED ORDER — ALBUTEROL SULFATE HFA 108 (90 BASE) MCG/ACT IN AERS: 2.0000 | INHALATION_SPRAY | RESPIRATORY_TRACT | 1 refills | Status: AC | PRN

## 2018-05-14 MED ORDER — GLIPIZIDE 10 MG PO TABS: 10.0000 mg | ORAL_TABLET | Freq: Two times a day (BID) | ORAL | 2 refills | Status: DC

## 2018-05-14 MED ORDER — OSELTAMIVIR PHOSPHATE 75 MG PO CAPS: 75.0000 mg | ORAL_CAPSULE | Freq: Two times a day (BID) | ORAL | 0 refills | Status: DC

## 2018-05-14 MED ORDER — ACETAMINOPHEN 325 MG PO TABS: 650.0000 mg | ORAL_TABLET | Freq: Four times a day (QID) | ORAL | 0 refills | Status: AC | PRN

## 2018-05-14 MED ORDER — SITAGLIPTIN PHOSPHATE 50 MG PO TABS: 50.0000 mg | ORAL_TABLET | Freq: Every day | ORAL | 2 refills | Status: DC

## 2018-05-14 NOTE — Discharge Summary (Signed)
[name], is a 68 y.o. female  DOB 1950-05-21  MRN [id number].  Admission date:  05/12/2018  Admitting Physician  Shela Leff, MD  Discharge Date:  05/14/2018   Primary MD  Redmond School, MD  Recommendations for primary care physician for things to follow:   1) smoking cessation strongly advised 2) very high risk of death, self injury fire when you smoke around oxygen 3)Avoid ibuprofen/Advil/Aleve/Motrin/Goody Powders/Naproxen/BC powders/Meloxicam/Diclofenac/Indomethacin and other Nonsteroidal anti-inflammatory medications as these will make you more likely to bleed and can cause stomach ulcers, can also cause Kidney problems.  4) take Tamiflu for influenza and take prednisone for COPD flareup as prescribed 5) follow-up with your primary care physician in less than a week for recheck and reevaluation   Admission Diagnosis  Influenza A [J10.1] COPD exacerbation ([place]) [J44.1] Acute on chronic respiratory failure with hypoxia ([place]) [J96.21]   Discharge Diagnosis  Influenza A [J10.1] COPD exacerbation ([place]) [J44.1] Acute on chronic respiratory failure with hypoxia ([place]) [J96.21]    Principal Problem:   Acute on chronic respiratory failure with hypoxia ([place]) Active Problems:   Type 2 diabetes mellitus without complication (HCC)   HTN (hypertension)   COPD exacerbation (HCC)   Influenza A   HLD (hyperlipidemia)   Thrombocytopenia ([place])      Past Medical History:  Diagnosis Date  . Arthritis    "hands, knees, feet"  . Asthma   . Colon polyps   . COPD (chronic obstructive pulmonary disease) ([place])   . Hyperlipidemia   . Hypertension   . Peripheral edema   . Pneumonia 08/2010; 11/2010  . SBO (small bowel obstruction) ([place]) 08/25/11   recurrent  . Type II diabetes mellitus ([place])     Past Surgical History:  Procedure Laterality Date  . APPENDECTOMY    . CHOLECYSTECTOMY  1979  .  COLONOSCOPY  2008   Dr. Oneida Alar: 87mm sessile cecal polyp and 41mm sessile transverse colon polyp, torturous sigmoid colon, fragments of tubular adenomas on path   . COLONOSCOPY WITH PROPOFOL N/A 08/13/2015   Procedure: COLONOSCOPY WITH PROPOFOL;  Surgeon: Danie Binder, MD;  Location: [facility];  Service: Endoscopy;  Laterality: N/A;  [id number]  . ESOPHAGEAL DILATION     remote past by Dr. Tamala Julian  . HERNIA REPAIR    . KNEE CARTILAGE SURGERY  07/2003   right/E-chart  . LAPAROSCOPIC INCISIONAL / UMBILICAL / VENTRAL HERNIA REPAIR  ? til 2002   "~ 13 times"  . POLYPECTOMY  08/13/2015   Procedure: POLYPECTOMY;  Surgeon: Danie Binder, MD;  Location: [facility];  Service: Endoscopy;;  ascending colon polyp, hepatic flexure polyp, transverse colon polyp, descending colon polyp, sigmoid colon polyp, rectal polyps times 3  . SMALL INTESTINE SURGERY     Feb 2015 at [facility] and then remote past   . [place]       HPI  from the history and physical done on the day of admission:    Chief Complaint: Shortness of breath  HPI:  WILMETTA SPEISER is a 68 y.o. female with medical history significant of COPD on 4 L home oxygen, hypertension, hyperlipidemia, type 2 diabetes presenting from [place] for evaluation of shortness of breath.  Oxygen saturation 84% on her usual 4 L home oxygen.  Patient was given Solu-Medrol 125 mg and albuterol 7.5 mg in route to the hospital.  Patient reports having shortness of breath, cough productive of sputum, and wheezing for the past 2 days.  States she was previously using 4 L home oxygen as needed but has required it continuously for the past few days.  Reports compliance with home inhalers.  Reports having fevers, chills, body aches, and fatigue.  States she vomited earlier today.  Denies having any nausea at present.  Denies having any abdominal pain, diarrhea, or constipation.  No recent sick contacts.  Denies having any chest pain.  Review  of Systems: As per HPI otherwise 10 point review of systems negative.   Hospital Course:    Acute on chronic hypoxic respiratory failure secondary to acute exacerbation of chronic obstructive pulmonary diseaselikelyprecipitated byinfluenza A infection------ overall improved, O2 sats low 90s on 3 to 4 L of oxygen post ambulation--okay to discharge home on Tamiflu, bronchodilators and mucolytics, Medically and radiologically no superimposed pneumonia, prednisone as prescribed  DM2-last A1c 7.4 reflecting fair control, anticipate worsening hyperglycemia due to steroids, continue Januvia and metformin as prescribed  Chronic pain/neuropathy -Continue home gabapentin  Hypertension -Blood pressure stable. Continue home enalapril.  Depression -Continue home Celexa  Tobacco abuse--smoking cessation strongly advised especially due to concomitant oxygen use   Code Status : Partial  Family Communication:    Husband at bedside   Disposition Plan  : home  RecentLabs       Lab Results  Component Value Date   PLT 141 (L) 05/13/2018       Discharge Condition: stable  Diet and Activity recommendation:  As advised  Discharge Instructions     Discharge Instructions    Call MD for:  difficulty breathing, headache or visual disturbances   Complete by:  As directed    Call MD for:  persistant dizziness or light-headedness   Complete by:  As directed    Call MD for:  persistant nausea and vomiting   Complete by:  As directed    Call MD for:  severe uncontrolled pain   Complete by:  As directed    Call MD for:  temperature >100.4   Complete by:  As directed    Diet - low sodium heart healthy   Complete by:  As directed    Diet Carb Modified   Complete by:  As directed    Discharge instructions   Complete by:  As directed    1) smoking cessation strongly advised 2) very high risk of death, self injury fire when you smoke around oxygen 3)Avoid  ibuprofen/Advil/Aleve/Motrin/Goody Powders/Naproxen/BC powders/Meloxicam/Diclofenac/Indomethacin and other Nonsteroidal anti-inflammatory medications as these will make you more likely to bleed and can cause stomach ulcers, can also cause Kidney problems.  4) take Tamiflu for influenza and take prednisone for COPD flareup as prescribed 5) follow-up with your primary care physician in less than a week for recheck and reevaluation   Increase activity slowly   Complete by:  As directed         Discharge Medications     Allergies as of 05/14/2018      Reactions   Hydrocodone-acetaminophen Other (See Comments)   Itching. Pt states she can  tolerate acetaminophen   Penicillins Hives, Itching   Has patient had a PCN reaction causing immediate rash, facial/tongue/throat swelling, SOB or lightheadedness with hypotension: Yes Has patient had a PCN reaction causing severe rash involving mucus membranes or skin necrosis: No Has patient had a PCN reaction that required hospitalization No Has patient had a PCN reaction occurring within the last 10 years: No If all of the above answers are "NO", then may proceed with Cephalosporin use. Has patient had a PCN reaction causing immediate rash, facial/tongue/throat swelling, SOB or lightheadedness with hypotension: Yes Has patient had a PCN reaction causing severe rash involving mucus membranes or skin necrosis: No Has patient had a PCN reaction that required hospitalization No Has patient had a PCN reaction occurring within the last 10 years: No If all of the above answers are "NO", then may proceed with Cephalosporin use. Can tolerate cephalosporins, per pt Electronically signed by: Rudene Christians, [facility] 06/07/2013 10:43 AM   Vicodin [hydrocodone-acetaminophen]    itching      Medication List    STOP taking these medications   meloxicam 15 MG tablet Commonly known as:  MOBIC   sitaGLIPtin-metformin 50-500 MG tablet Commonly known as:  JANUMET      TAKE these medications   acetaminophen 325 MG tablet Commonly known as:  TYLENOL Take 2 tablets (650 mg total) by mouth every 6 (six) hours as needed for mild pain, fever or headache.   albuterol 108 (90 Base) MCG/ACT inhaler Commonly known as:  PROVENTIL HFA;VENTOLIN HFA Inhale 2 puffs into the lungs every 4 (four) hours as needed. For shortness of breath What changed:  Another medication with the same name was added. Make sure you understand how and when to take each.   albuterol (2.5 MG/3ML) 0.083% nebulizer solution Commonly known as:  PROVENTIL Take 3 mLs (2.5 mg total) by nebulization every 6 (six) hours as needed for wheezing or shortness of breath. What changed:  You were already taking a medication with the same name, and this prescription was added. Make sure you understand how and when to take each.   busPIRone 10 MG tablet Commonly known as:  BUSPAR Take 10 mg by mouth 2 (two) times daily.   citalopram 20 MG tablet Commonly known as:  CELEXA Take 20 mg by mouth daily.   doxycycline 100 MG tablet Commonly known as:  VIBRA-TABS Take 1 tablet (100 mg total) by mouth 2 (two) times daily for 5 days.   enalapril 20 MG tablet Commonly known as:  VASOTEC Take 20 mg by mouth daily.   Fluticasone-Salmeterol 250-50 MCG/DOSE Aepb Commonly known as:  ADVAIR DISKUS Inhale 1 puff into the lungs 2 (two) times daily.   gabapentin 300 MG capsule Commonly known as:  NEURONTIN Take 300 mg by mouth 3 (three) times daily.   glipiZIDE 10 MG tablet Commonly known as:  GLUCOTROL Take 1 tablet (10 mg total) by mouth 2 (two) times daily before a meal.   guaiFENesin 600 MG 12 hr tablet Commonly known as:  MUCINEX Take 1 tablet (600 mg total) by mouth 2 (two) times daily for 10 days.   lovastatin 40 MG tablet Commonly known as:  MEVACOR Take 40 mg by mouth at bedtime.   metFORMIN 1000 MG tablet Commonly known as:  GLUCOPHAGE Take 1 tablet (1,000 mg total) by mouth 2 (two) times  daily with a meal. What changed:    medication strength  how much to take   oseltamivir 75 MG capsule Commonly known  as:  TAMIFLU Take 1 capsule (75 mg total) by mouth 2 (two) times daily.   predniSONE 20 MG tablet Commonly known as:  DELTASONE Take 2 tablets (40 mg total) by mouth daily with breakfast.   sitaGLIPtin 50 MG tablet Commonly known as:  JANUVIA Take 1 tablet (50 mg total) by mouth daily.   tiotropium 18 MCG inhalation capsule Commonly known as:  SPIRIVA HANDIHALER Place 1 capsule (18 mcg total) into inhaler and inhale daily.   tiZANidine 4 MG tablet Commonly known as:  ZANAFLEX 4 mg.   traMADol 50 MG tablet Commonly known as:  ULTRAM Take 50 mg by mouth every 8 (eight) hours as needed for moderate pain.       Major procedures and Radiology Reports - PLEASE review detailed and final reports for all details, in brief -   Dg Chest Portable 1 View  Result Date: 05/12/2018 CLINICAL DATA:  Shortness of breath, decreased lung sounds and low oxygen saturation of 84%, history asthma, COPD, hypertension, type II diabetes mellitus EXAM: PORTABLE CHEST 1 VIEW COMPARISON:  Portable exam 1657 hours compared to 11/06/2017 FINDINGS: Normal heart size, mediastinal contours, and pulmonary vascularity. Lungs clear. Minimal chronic peribronchial thickening. No infiltrate, pleural effusion or pneumothorax. Bones demineralized. IMPRESSION: Minimal chronic bronchitic changes without infiltrate. Electronically Signed   By: Lavonia Dana M.D.   On: 05/12/2018 17:14    Micro Results    Recent Results (from the past 240 hour(s))  MRSA PCR Screening     Status: None   Collection Time: 05/12/18 11:05 PM  Result Value Ref Range Status   MRSA by PCR NEGATIVE NEGATIVE Final    Comment:        The GeneXpert MRSA Assay (FDA approved for NASAL specimens only), is one component of a comprehensive MRSA colonization surveillance program. It is not intended to diagnose MRSA infection nor  to guide or monitor treatment for MRSA infections. Performed at [facility], [address], [place], [place] [address]        Today   Subjective    Brynnlie Unterreiner today has no new complaints, No fever  Or chills , no vomiting, ambulated and post ambulation  O2 sats low 90s on 3 to 4 L of oxygen          Patient has been seen and examined prior to discharge   Objective   Blood pressure (!) 149/62, pulse 70, temperature 98.1 F (36.7 C), resp. rate (!) 27, height 5' 4.5" (1.638 m), weight 82.2 kg, SpO2 91 %.  No intake or output data in the 24 hours ending 05/14/18 1613  Exam Gen:- Awake Alert, no acute distress , able to speak in complete sentences HEENT:- [place].AT, No sclera icterus Nose-- 3L/min Neck-Supple Neck,No JVD,.  Lungs-improving air movement, no wheezing or rhonchi  CV- S1, S2 normal, regular Abd-  +ve B.Sounds, Abd Soft, No tenderness,    Extremity/Skin:- No  edema,   good pulses Psych-affect is appropriate, oriented x3 Neuro-no new focal deficits, no tremors    Data Review   CBC w Diff:  Lab Results  Component Value Date   WBC 4.8 05/13/2018   HGB 13.2 05/13/2018   HCT 41.5 05/13/2018   PLT 141 (L) 05/13/2018   LYMPHOPCT 9 05/12/2018   MONOPCT 5 05/12/2018   EOSPCT 1 05/12/2018   BASOPCT 1 05/12/2018    CMP:  Lab Results  Component Value Date   NA 138 05/12/2018   K 3.6 05/12/2018   CL 104  05/12/2018   CO2 23 05/12/2018   BUN 12 05/12/2018   CREATININE 0.61 05/12/2018   PROT 6.3 (L) 05/12/2018   ALBUMIN 3.6 05/12/2018   BILITOT 0.4 05/12/2018   ALKPHOS 53 05/12/2018   AST 19 05/12/2018   ALT 15 05/12/2018  .   Total Discharge time is about 33 minutes  Roxan Hockey M.D on 05/14/2018 at 4:13 PM  Go to [email or web address] -  for contact info  [facility] - Office  [phone]

## 2018-05-14 NOTE — Discharge Instructions (Signed)
1) smoking cessation strongly advised 2) very high risk of death, self injury fire when you smoke around oxygen 3)Avoid ibuprofen/Advil/Aleve/Motrin/Goody Powders/Naproxen/BC powders/Meloxicam/Diclofenac/Indomethacin and other Nonsteroidal anti-inflammatory medications as these will make you more likely to bleed and can cause stomach ulcers, can also cause Kidney problems.  4) take Tamiflu for influenza and take prednisone for COPD flareup as prescribed 5) follow-up with your primary care physician in less than a week for recheck and reevaluation

## 2018-05-18 DIAGNOSIS — J111 Influenza due to unidentified influenza virus with other respiratory manifestations: Secondary | ICD-10-CM | POA: Diagnosis not present

## 2018-05-18 DIAGNOSIS — E1165 Type 2 diabetes mellitus with hyperglycemia: Secondary | ICD-10-CM | POA: Diagnosis not present

## 2018-05-18 DIAGNOSIS — Z6829 Body mass index (BMI) 29.0-29.9, adult: Secondary | ICD-10-CM | POA: Diagnosis not present

## 2018-05-18 DIAGNOSIS — J441 Chronic obstructive pulmonary disease with (acute) exacerbation: Secondary | ICD-10-CM | POA: Diagnosis not present

## 2018-05-18 DIAGNOSIS — J9620 Acute and chronic respiratory failure, unspecified whether with hypoxia or hypercapnia: Secondary | ICD-10-CM | POA: Diagnosis not present

## 2018-05-26 DIAGNOSIS — J189 Pneumonia, unspecified organism: Secondary | ICD-10-CM | POA: Diagnosis not present

## 2018-05-26 DIAGNOSIS — I10 Essential (primary) hypertension: Secondary | ICD-10-CM | POA: Diagnosis not present

## 2018-05-26 DIAGNOSIS — I700 Atherosclerosis of aorta: Secondary | ICD-10-CM | POA: Diagnosis not present

## 2018-05-26 DIAGNOSIS — E119 Type 2 diabetes mellitus without complications: Secondary | ICD-10-CM | POA: Diagnosis not present

## 2018-05-26 DIAGNOSIS — E063 Autoimmune thyroiditis: Secondary | ICD-10-CM | POA: Diagnosis not present

## 2018-05-26 DIAGNOSIS — J449 Chronic obstructive pulmonary disease, unspecified: Secondary | ICD-10-CM | POA: Diagnosis not present

## 2018-05-26 DIAGNOSIS — R0902 Hypoxemia: Secondary | ICD-10-CM | POA: Diagnosis not present

## 2018-05-26 DIAGNOSIS — J441 Chronic obstructive pulmonary disease with (acute) exacerbation: Secondary | ICD-10-CM | POA: Diagnosis not present

## 2018-05-26 DIAGNOSIS — Z6830 Body mass index (BMI) 30.0-30.9, adult: Secondary | ICD-10-CM | POA: Diagnosis not present

## 2018-06-02 DIAGNOSIS — J449 Chronic obstructive pulmonary disease, unspecified: Secondary | ICD-10-CM | POA: Diagnosis not present

## 2018-06-02 DIAGNOSIS — Z1389 Encounter for screening for other disorder: Secondary | ICD-10-CM | POA: Diagnosis not present

## 2018-06-02 DIAGNOSIS — Z6830 Body mass index (BMI) 30.0-30.9, adult: Secondary | ICD-10-CM | POA: Diagnosis not present

## 2018-06-02 DIAGNOSIS — J189 Pneumonia, unspecified organism: Secondary | ICD-10-CM | POA: Diagnosis not present

## 2018-06-02 DIAGNOSIS — E6609 Other obesity due to excess calories: Secondary | ICD-10-CM | POA: Diagnosis not present

## 2018-06-13 DIAGNOSIS — J441 Chronic obstructive pulmonary disease with (acute) exacerbation: Secondary | ICD-10-CM | POA: Diagnosis not present

## 2018-06-14 IMAGING — DX DG TIBIA/FIBULA 2V*L*
2 series · 2 of 2 positions shown · non-contrast
Comparison: None.

CLINICAL DATA: Slipped 5 weeks ago falling onto the right side with
some left knee and lower leg pain

EXAM:
LEFT TIBIA AND FIBULA - 2 VIEW

[tibia ap]
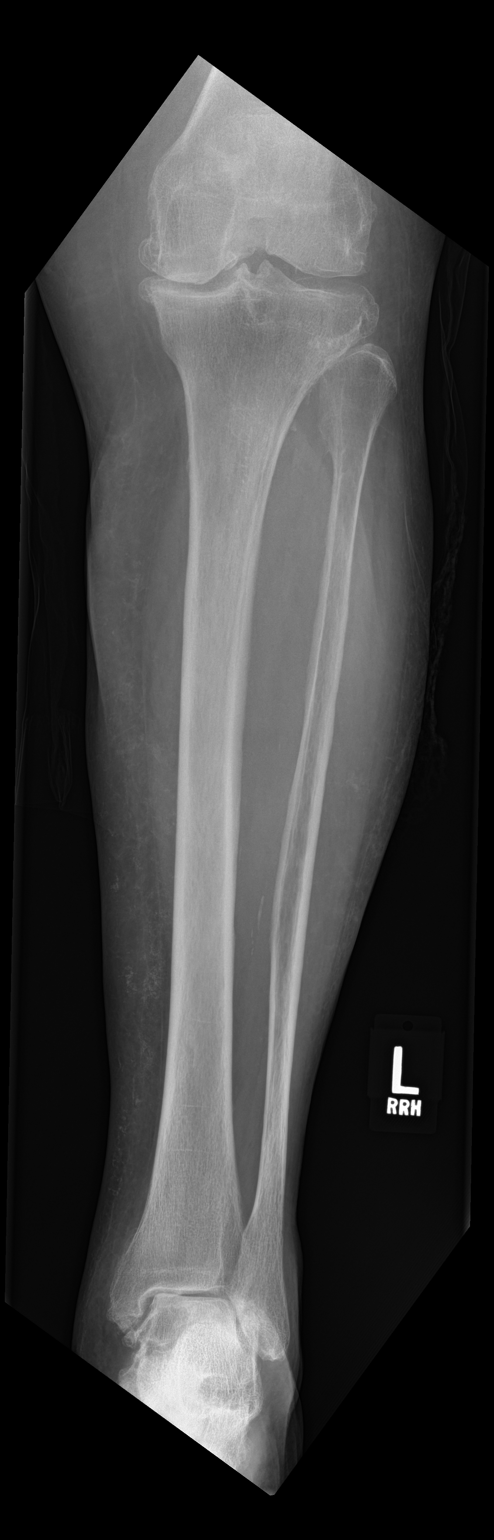

[tibia lat]
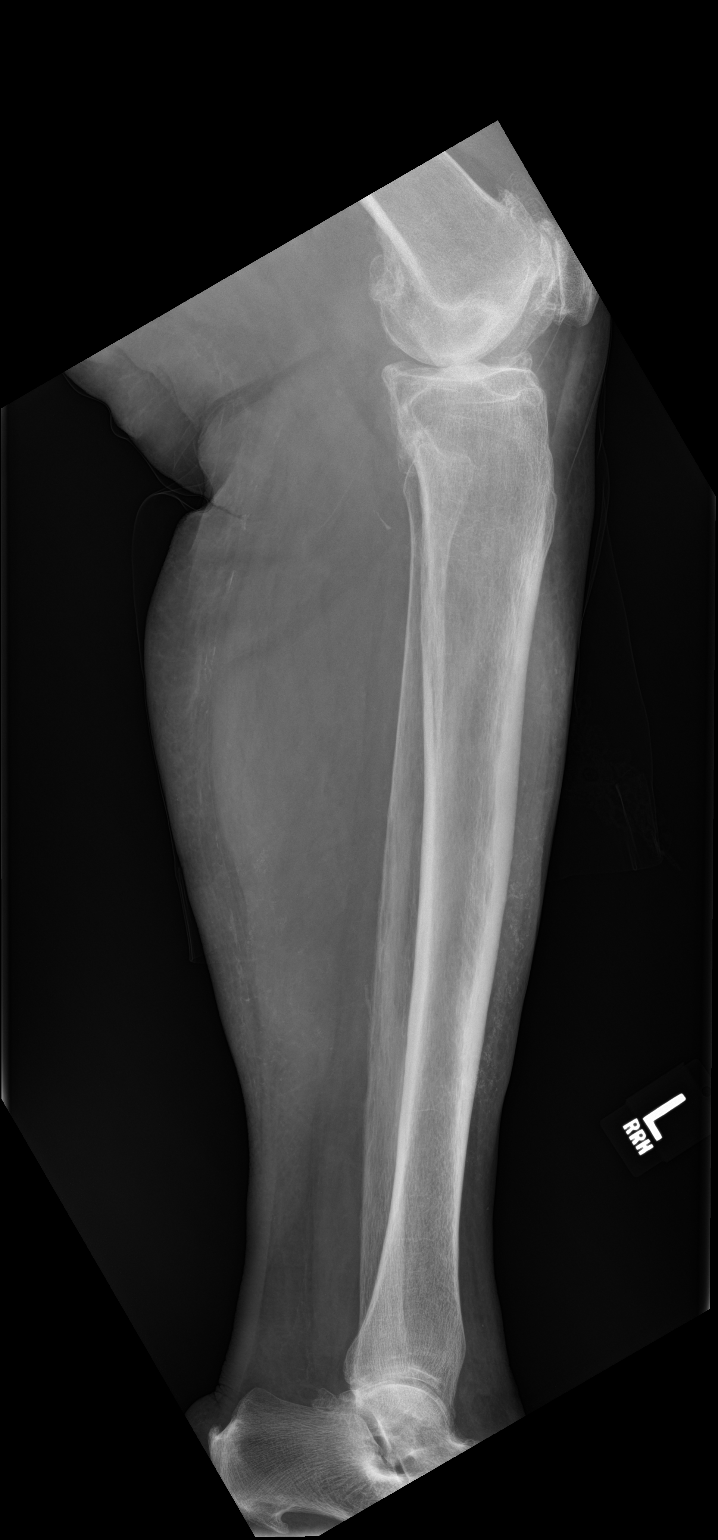

[2 of 2 positions shown; findings below may reference images not displayed]

FINDINGS: There is significant degenerative joint disease of the left knee
primarily involving the medial and patellofemoral compartments.
There is loss of joint space with sclerosis and spurring involving
these 2 compartments with relative preservation of the lateral
compartment. The left tibia and fibula are intact and normally
aligned. Some soft tissue calcification is present.
IMPRESSION: 1. No acute fracture.
2. Significant bicompartmental degenerative joint disease of the
left knee.

## 2018-06-15 ENCOUNTER — Encounter: Payer: Self-pay | Admitting: Orthopedic Surgery

## 2018-06-15 ENCOUNTER — Ambulatory Visit: Payer: Medicare HMO | Admitting: Orthopedic Surgery

## 2018-06-15 VITALS — BP 148/71 | HR 66 | Ht 64.5 in | Wt 187.0 lb

## 2018-06-15 DIAGNOSIS — M1710 Unilateral primary osteoarthritis, unspecified knee: Secondary | ICD-10-CM

## 2018-06-15 DIAGNOSIS — M1712 Unilateral primary osteoarthritis, left knee: Secondary | ICD-10-CM | POA: Diagnosis not present

## 2018-06-15 NOTE — Progress Notes (Signed)
Progress Note   Patient ID: [name], female   DOB: 08-19-1950, 68 y.o.   MRN: [id number]   Chief Complaint  Patient presents with  . Knee Pain    left    68 year old female needs left knee arthroplasty but timing issue at this time x-ray taken Sep 02, 2017 shows severe arthritis of the medial compartment secondary bone changes joint space narrowing and osteopenia  Injection was done on November 4 along with aspiration with good result until recent days.  We obtained 25 cc of fluid at that time she presents back for as needed aspiration injection  She is concerned that the knee is giving out more and she is feeling more crepitation    Review of Systems  Constitutional: Negative for fever.  Respiratory: Positive for shortness of breath.        Recent hospitalization with diagnosis of flu and pneumonia currently asymptomatic     Allergies  Allergen Reactions  . Hydrocodone-Acetaminophen Other (See Comments)    Itching. Pt states she can tolerate acetaminophen  . Penicillins Hives and Itching    Has patient had a PCN reaction causing immediate rash, facial/tongue/throat swelling, SOB or lightheadedness with hypotension: Yes Has patient had a PCN reaction causing severe rash involving mucus membranes or skin necrosis: No Has patient had a PCN reaction that required hospitalization No Has patient had a PCN reaction occurring within the last 10 years: No If all of the above answers are "NO", then may proceed with Cephalosporin use.  Has patient had a PCN reaction causing immediate rash, facial/tongue/throat swelling, SOB or lightheadedness with hypotension: Yes Has patient had a PCN reaction causing severe rash involving mucus membranes or skin necrosis: No Has patient had a PCN reaction that required hospitalization No Has patient had a PCN reaction occurring within the last 10 years: No If all of the above answers are "NO", then may proceed with Cephalosporin use. Can  tolerate cephalosporins, per pt Electronically signed by: Rudene Christians, [facility] 06/07/2013 10:43 AM  . Vicodin [Hydrocodone-Acetaminophen]     itching     BP (!) 148/71   Pulse 66   Ht 5' 4.5" (1.638 m)   Wt 187 lb (84.8 kg)   BMI 31.60 kg/m   Physical Exam Vitals signs reviewed.  Constitutional:      Appearance: Normal appearance. She is well-developed.  Musculoskeletal:     Left knee: She exhibits decreased range of motion and bony tenderness. She exhibits no swelling, no effusion, no ecchymosis, no deformity, no laceration, no erythema, no LCL laxity, normal meniscus and no MCL laxity. Tenderness found. Medial joint line and lateral joint line tenderness noted.  Neurological:     Mental Status: She is alert and oriented to person, place, and time.  Psychiatric:        Attention and Perception: Attention normal.        Mood and Affect: Mood and affect normal.        Speech: Speech normal.        Behavior: Behavior normal.        Thought Content: Thought content normal.        Judgment: Judgment normal.      Medical decisions:   Data  Imaging:   Last x-ray was 2019, that was a 4 view knee from the hospital it showed severe arthritis of the medial compartment with 7 degrees of valgus secondary bone changes are osteophytes and subchondral sclerosis and some cyst formation mild osteopenia is noted  at that x-ray    Encounter Diagnosis  Name Primary?  . Primary localized osteoarthritis of knee-left Yes    PLAN:   Procedure note left knee injection verbal consent was obtained to inject left knee joint  Timeout was completed to confirm the site of injection  The medications used were 40 mg of Depo-Medrol and 1% lidocaine 3 cc  Anesthesia was provided by ethyl chloride and the skin was prepped with alcohol.  After cleaning the skin with alcohol a 20-gauge needle was used to inject the left knee joint. There were no complications. A sterile bandage was  applied.    The patient would like to proceed with knee replacement surgery.  Her BMI is 31  She is going to stop smoking for the next month at least try a nicotine patch  Follow-up in 4 weeks for preop and x-ray left knee    Arther Abbott, MD 06/15/2018 9:19 AM

## 2018-07-07 DIAGNOSIS — E6609 Other obesity due to excess calories: Secondary | ICD-10-CM | POA: Diagnosis not present

## 2018-07-07 DIAGNOSIS — Z6831 Body mass index (BMI) 31.0-31.9, adult: Secondary | ICD-10-CM | POA: Diagnosis not present

## 2018-07-07 DIAGNOSIS — J441 Chronic obstructive pulmonary disease with (acute) exacerbation: Secondary | ICD-10-CM | POA: Diagnosis not present

## 2018-07-12 DIAGNOSIS — J441 Chronic obstructive pulmonary disease with (acute) exacerbation: Secondary | ICD-10-CM | POA: Diagnosis not present

## 2018-07-13 ENCOUNTER — Ambulatory Visit: Payer: Medicare HMO | Admitting: Orthopedic Surgery

## 2018-07-13 IMAGING — DX DG ABDOMEN 1V
2 series · 2 of 2 positions shown · non-contrast
Comparison: CT 08/21/2017.  Abdominal series 05/07/2013.

CLINICAL DATA: History of small-bowel obstruction.

EXAM:
ABDOMEN - 1 VIEW

[abdomen kub (1 of 2)]
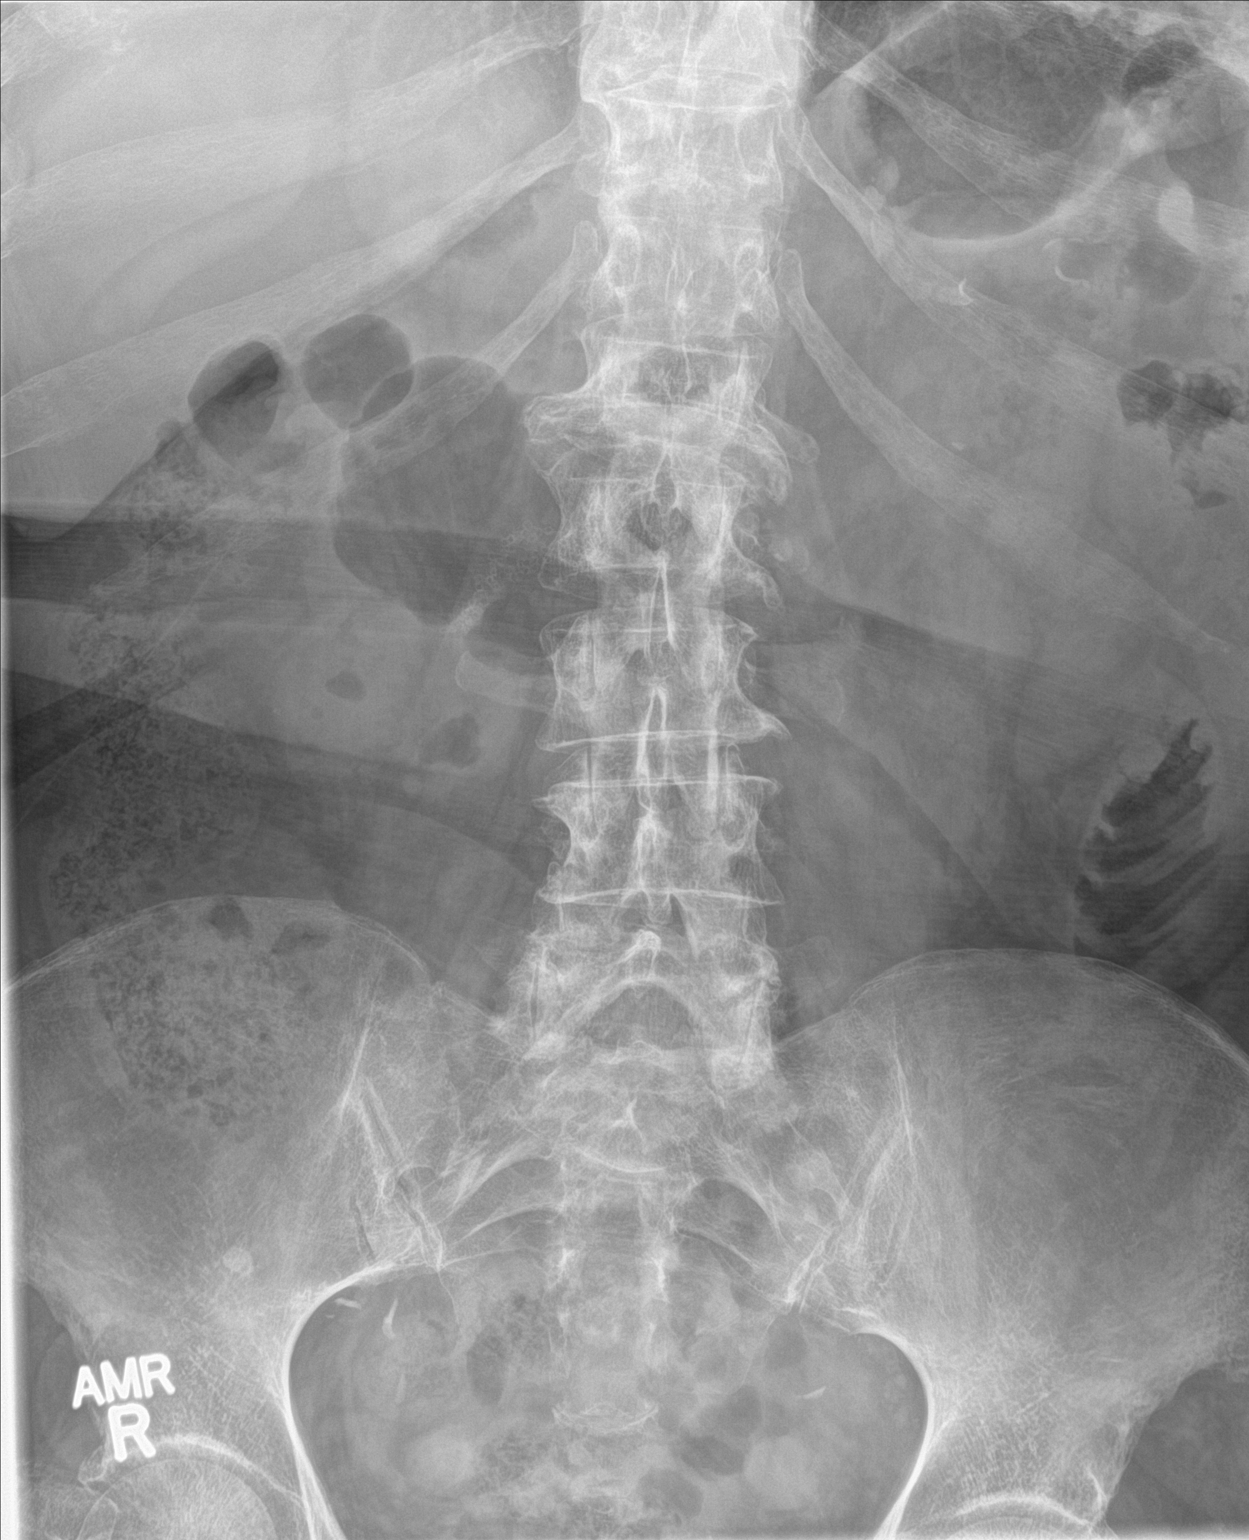

[abdomen kub (2 of 2)]
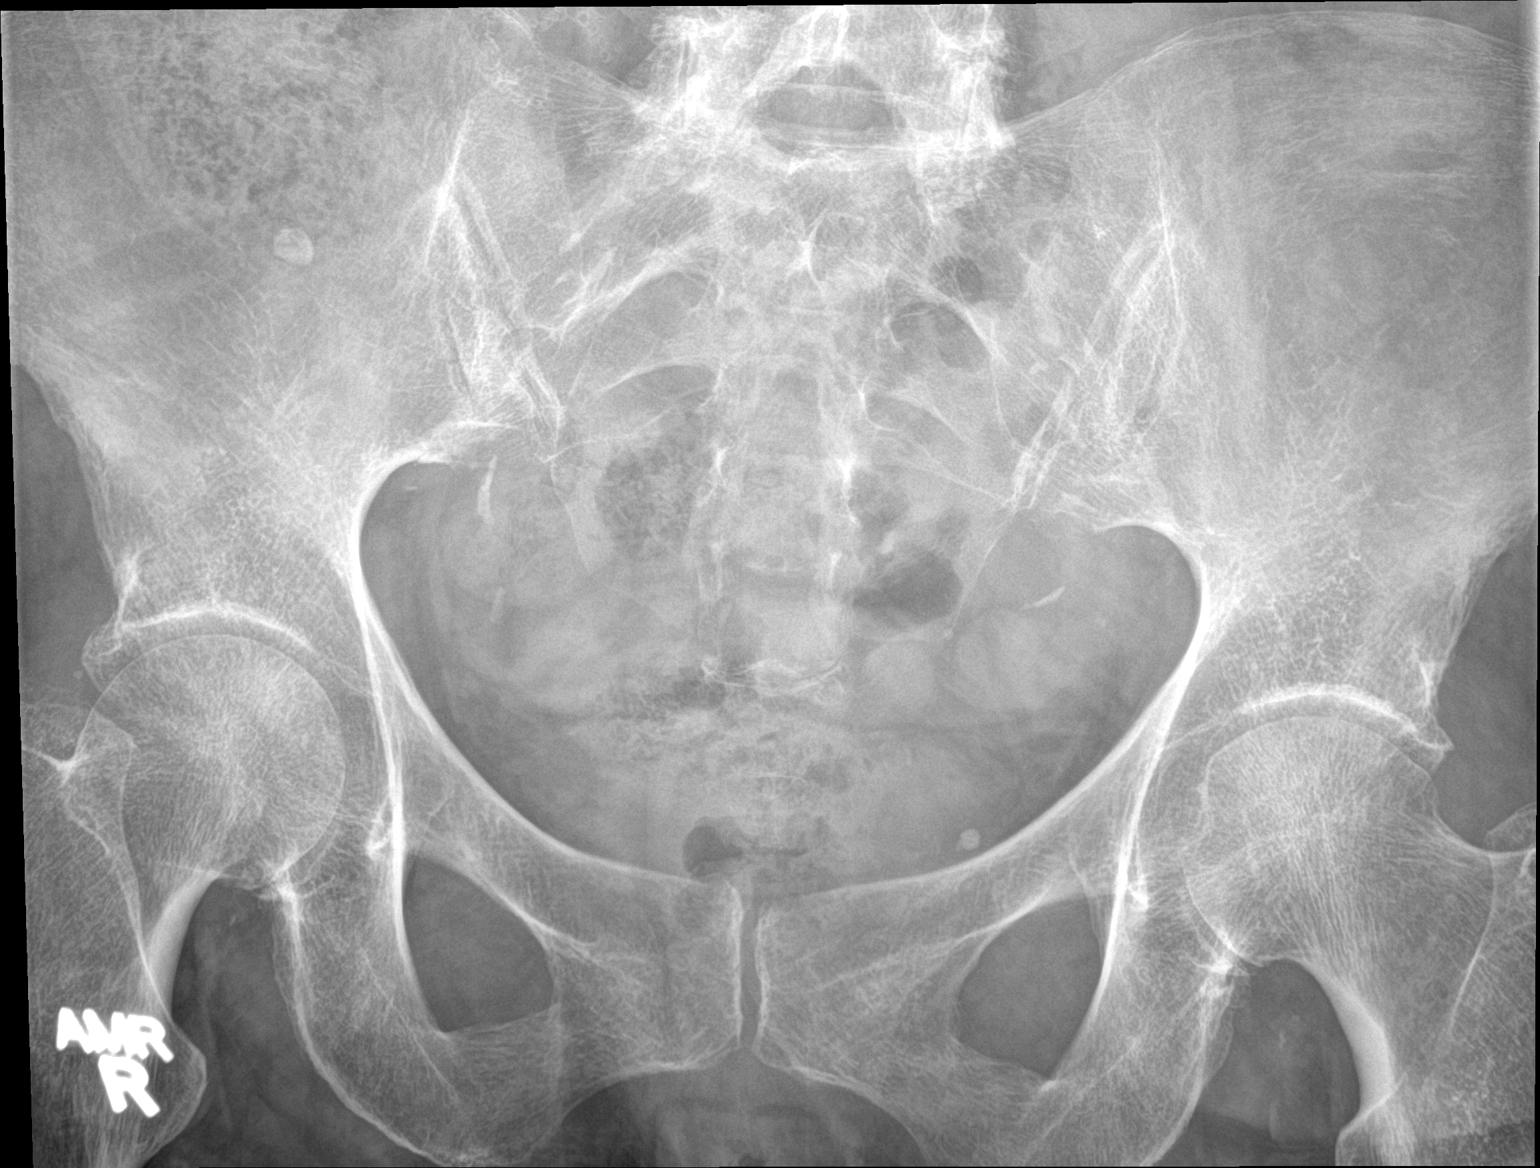

[2 of 2 positions shown; findings below may reference images not displayed]

FINDINGS: Surgical sutures are noted over the abdomen. Soft tissue structures
are unremarkable. Single loop of air-filled small bowel noted.
Colonic gas pattern is normal. This gas pattern is nonspecific.
Stool noted in the colon and rectum. No free air. Previously
identified small bowel obstruction appears to no longer be present.
Degenerative changes thoracolumbar spine and both hips. Pelvic
calcifications consistent phleboliths. Aortoiliac atherosclerotic
vascular calcification.
IMPRESSION: Interim resolution of small-bowel obstruction. No acute abnormality.

## 2018-07-13 IMAGING — DX DG CHEST 2V
2 series · 2 of 2 positions shown · non-contrast
Comparison: 04/30/2014.

CLINICAL DATA: Cough.

EXAM:
CHEST - 2 VIEW

[chest pa]
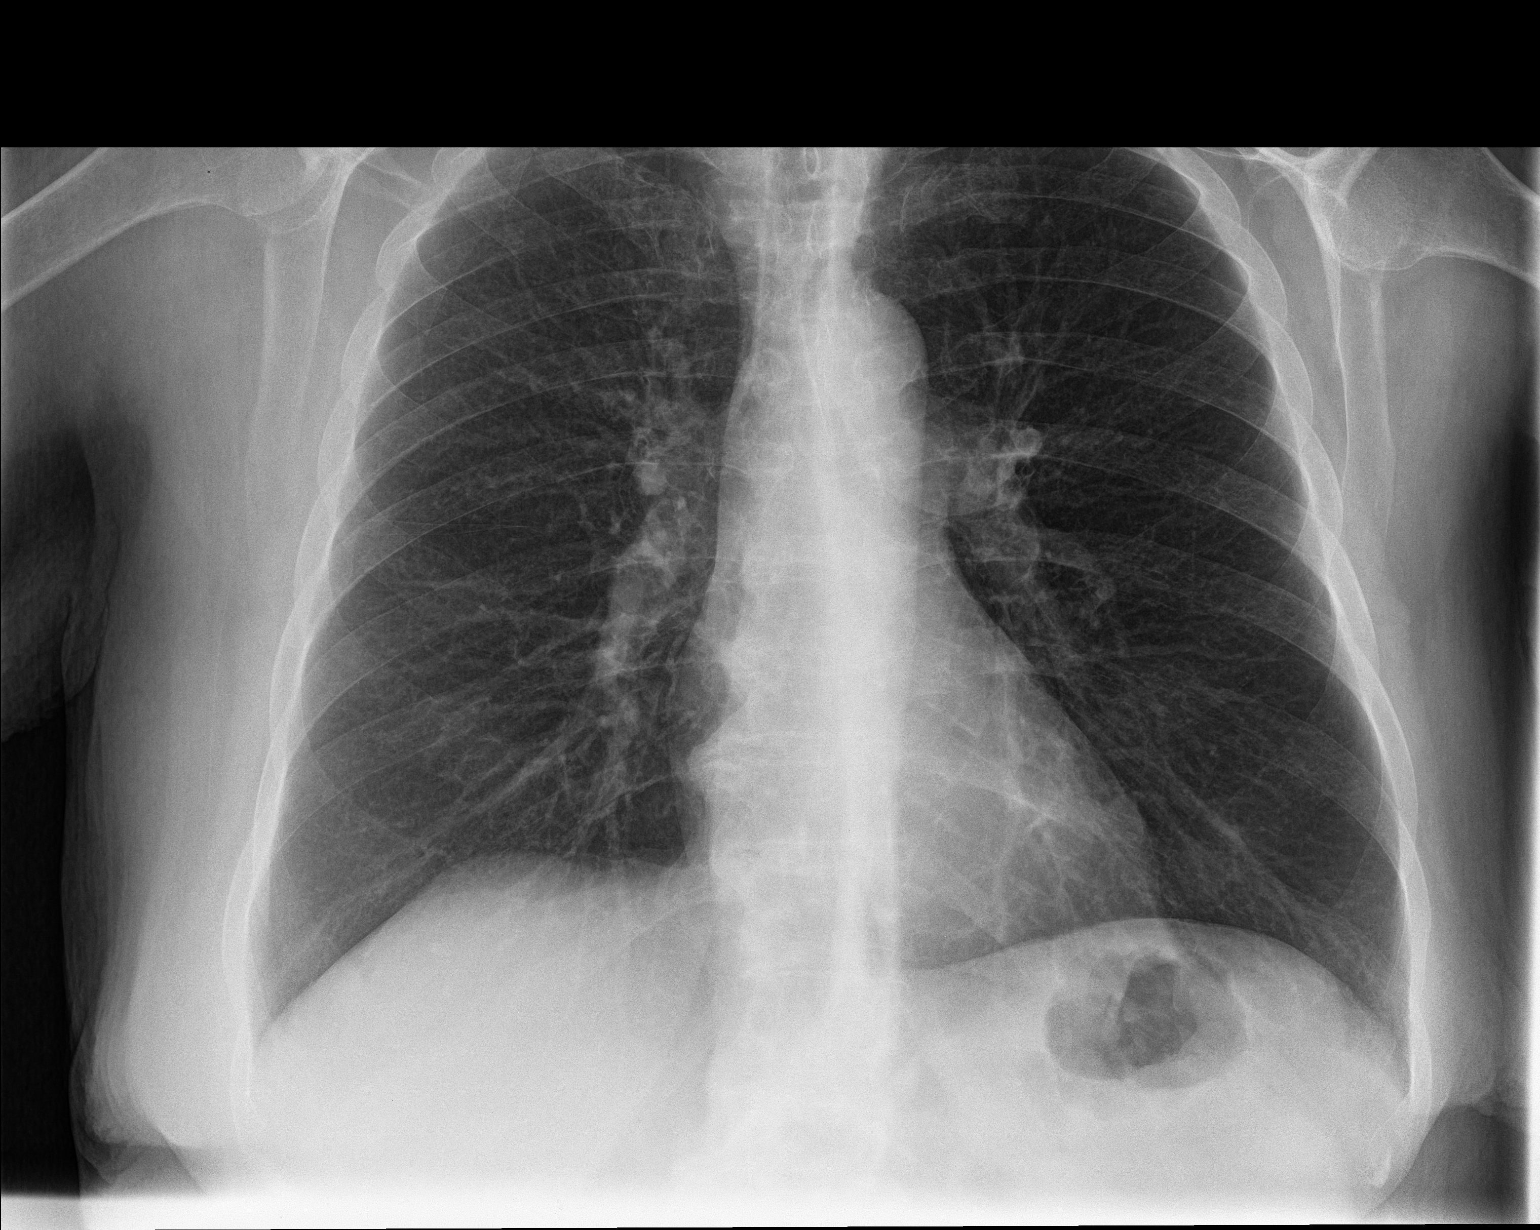

[chest lat]
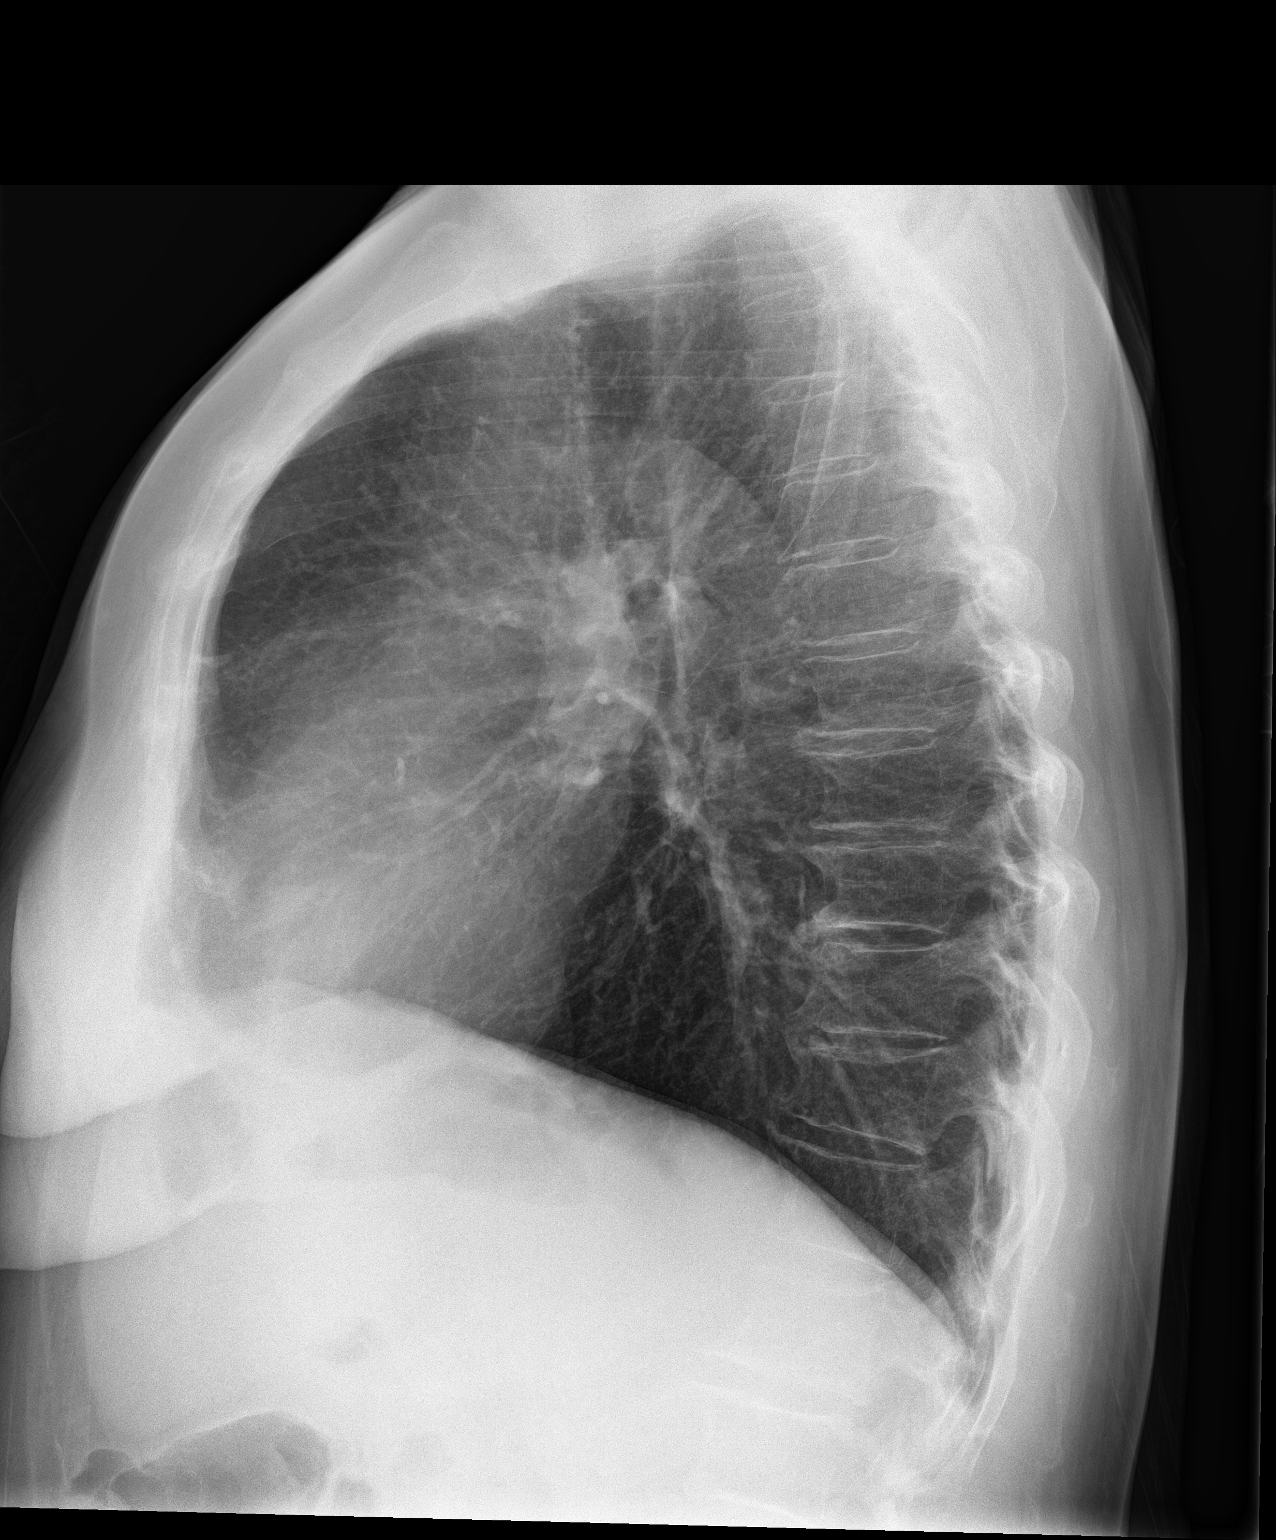

[2 of 2 positions shown; findings below may reference images not displayed]

FINDINGS: Mediastinum and hilar structures normal. Lungs are clear. No pleural
effusion or pneumothorax. Lower thoracic vertebral body mild
compression fracture unchanged from prior exam. Diffuse osteopenia
degenerative change.
IMPRESSION: No acute cardiopulmonary disease.

## 2018-08-01 ENCOUNTER — Ambulatory Visit: Payer: Medicare HMO | Admitting: Orthopedic Surgery

## 2018-08-02 ENCOUNTER — Inpatient Hospital Stay (HOSPITAL_COMMUNITY)
Admission: EM | Admit: 2018-08-02 | Discharge: 2018-08-06 | DRG: 389 | Disposition: A | Discharge status: Home / Self Care | Payer: Medicare HMO | Attending: Internal Medicine | Admitting: Internal Medicine

## 2018-08-02 ENCOUNTER — Emergency Department (HOSPITAL_COMMUNITY): Payer: Medicare HMO

## 2018-08-02 ENCOUNTER — Encounter (HOSPITAL_COMMUNITY): Payer: Self-pay | Admitting: Emergency Medicine

## 2018-08-02 ENCOUNTER — Other Ambulatory Visit: Payer: Self-pay

## 2018-08-02 DIAGNOSIS — J449 Chronic obstructive pulmonary disease, unspecified: Secondary | ICD-10-CM | POA: Diagnosis present

## 2018-08-02 DIAGNOSIS — F329 Major depressive disorder, single episode, unspecified: Secondary | ICD-10-CM | POA: Diagnosis present

## 2018-08-02 DIAGNOSIS — Z7984 Long term (current) use of oral hypoglycemic drugs: Secondary | ICD-10-CM

## 2018-08-02 DIAGNOSIS — E6609 Other obesity due to excess calories: Secondary | ICD-10-CM | POA: Diagnosis not present

## 2018-08-02 DIAGNOSIS — F17210 Nicotine dependence, cigarettes, uncomplicated: Secondary | ICD-10-CM | POA: Diagnosis present

## 2018-08-02 DIAGNOSIS — R1013 Epigastric pain: Secondary | ICD-10-CM | POA: Diagnosis not present

## 2018-08-02 DIAGNOSIS — Z885 Allergy status to narcotic agent status: Secondary | ICD-10-CM

## 2018-08-02 DIAGNOSIS — Z9981 Dependence on supplemental oxygen: Secondary | ICD-10-CM

## 2018-08-02 DIAGNOSIS — Z86010 Personal history of colonic polyps: Secondary | ICD-10-CM

## 2018-08-02 DIAGNOSIS — J9611 Chronic respiratory failure with hypoxia: Secondary | ICD-10-CM | POA: Diagnosis present

## 2018-08-02 DIAGNOSIS — Z7952 Long term (current) use of systemic steroids: Secondary | ICD-10-CM

## 2018-08-02 DIAGNOSIS — Z6831 Body mass index (BMI) 31.0-31.9, adult: Secondary | ICD-10-CM | POA: Diagnosis not present

## 2018-08-02 DIAGNOSIS — E119 Type 2 diabetes mellitus without complications: Secondary | ICD-10-CM | POA: Diagnosis present

## 2018-08-02 DIAGNOSIS — K5669 Other partial intestinal obstruction: Secondary | ICD-10-CM | POA: Diagnosis present

## 2018-08-02 DIAGNOSIS — K56609 Unspecified intestinal obstruction, unspecified as to partial versus complete obstruction: Secondary | ICD-10-CM | POA: Diagnosis present

## 2018-08-02 DIAGNOSIS — Z880 Allergy status to penicillin: Secondary | ICD-10-CM | POA: Diagnosis not present

## 2018-08-02 DIAGNOSIS — I10 Essential (primary) hypertension: Secondary | ICD-10-CM | POA: Diagnosis present

## 2018-08-02 DIAGNOSIS — Z800 Family history of malignant neoplasm of digestive organs: Secondary | ICD-10-CM

## 2018-08-02 DIAGNOSIS — F419 Anxiety disorder, unspecified: Secondary | ICD-10-CM | POA: Diagnosis present

## 2018-08-02 DIAGNOSIS — E785 Hyperlipidemia, unspecified: Secondary | ICD-10-CM | POA: Diagnosis present

## 2018-08-02 DIAGNOSIS — Z8249 Family history of ischemic heart disease and other diseases of the circulatory system: Secondary | ICD-10-CM | POA: Diagnosis not present

## 2018-08-02 DIAGNOSIS — E860 Dehydration: Secondary | ICD-10-CM | POA: Diagnosis present

## 2018-08-02 DIAGNOSIS — K769 Liver disease, unspecified: Secondary | ICD-10-CM | POA: Diagnosis present

## 2018-08-02 DIAGNOSIS — R1110 Vomiting, unspecified: Secondary | ICD-10-CM | POA: Diagnosis not present

## 2018-08-02 DIAGNOSIS — N179 Acute kidney failure, unspecified: Secondary | ICD-10-CM | POA: Diagnosis present

## 2018-08-02 DIAGNOSIS — M1990 Unspecified osteoarthritis, unspecified site: Secondary | ICD-10-CM | POA: Diagnosis present

## 2018-08-02 DIAGNOSIS — Z79899 Other long term (current) drug therapy: Secondary | ICD-10-CM

## 2018-08-02 LAB — URINALYSIS, ROUTINE W REFLEX MICROSCOPIC
Bilirubin Urine: NEGATIVE
Glucose, UA: NEGATIVE mg/dL
Hgb urine dipstick: NEGATIVE
Ketones, ur: 20 mg/dL — AB
Leukocytes,Ua: NEGATIVE
Nitrite: NEGATIVE
Protein, ur: NEGATIVE mg/dL
Specific Gravity, Urine: 1.041 — ABNORMAL HIGH (ref 1.005–1.030)
pH: 5 (ref 5.0–8.0)

## 2018-08-02 LAB — COMPREHENSIVE METABOLIC PANEL
ALT: 17 U/L (ref 0–44)
AST: 12 U/L — ABNORMAL LOW (ref 15–41)
Albumin: 3.9 g/dL (ref 3.5–5.0)
Alkaline Phosphatase: 60 U/L (ref 38–126)
Anion gap: 15 (ref 5–15)
BUN: 66 mg/dL — ABNORMAL HIGH (ref 8–23)
CO2: 32 mmol/L (ref 22–32)
Calcium: 9.1 mg/dL (ref 8.9–10.3)
Chloride: 87 mmol/L — ABNORMAL LOW (ref 98–111)
Creatinine, Ser: 1.38 mg/dL — ABNORMAL HIGH (ref 0.44–1.00)
GFR calc Af Amer: 46 mL/min — ABNORMAL LOW (ref >60)
GFR calc non Af Amer: 40 mL/min — ABNORMAL LOW (ref >60)
Glucose, Bld: 191 mg/dL — ABNORMAL HIGH (ref 70–99)
Potassium: 4.5 mmol/L (ref 3.5–5.1)
Sodium: 134 mmol/L — ABNORMAL LOW (ref 135–145)
Total Bilirubin: 0.9 mg/dL (ref 0.3–1.2)
Total Protein: 7.5 g/dL (ref 6.5–8.1)

## 2018-08-02 LAB — CBC
HCT: 44.2 % (ref 36.0–46.0)
Hemoglobin: 14.9 g/dL (ref 12.0–15.0)
MCH: 30.9 pg (ref 26.0–34.0)
MCHC: 33.7 g/dL (ref 30.0–36.0)
MCV: 91.7 fL (ref 80.0–100.0)
Platelets: 284 10*3/uL (ref 150–400)
RBC: 4.82 MIL/uL (ref 3.87–5.11)
RDW: 13.2 % (ref 11.5–15.5)
WBC: 9 10*3/uL (ref 4.0–10.5)
nRBC: 0 % (ref 0.0–0.2)

## 2018-08-02 LAB — GLUCOSE, CAPILLARY: Glucose-Capillary: 181 mg/dL — ABNORMAL HIGH (ref 70–99)

## 2018-08-02 LAB — LIPASE, BLOOD: Lipase: 19 U/L (ref 11–51)

## 2018-08-02 MED ORDER — SODIUM CHLORIDE 0.9% FLUSH: 3.0000 mL | Freq: Once | INTRAVENOUS | Status: DC

## 2018-08-02 MED ORDER — LACTATED RINGERS IV SOLN
INTRAVENOUS | Status: DC
  Administered 2018-08-02 – 2018-08-06 (×7): via INTRAVENOUS

## 2018-08-02 MED ORDER — MOMETASONE FURO-FORMOTEROL FUM 200-5 MCG/ACT IN AERO
2.0000 | INHALATION_SPRAY | Freq: Two times a day (BID) | RESPIRATORY_TRACT | Status: DC
  Administered 2018-08-03 – 2018-08-06 (×6): 2 via RESPIRATORY_TRACT
  Filled 2018-08-02: qty 8.8

## 2018-08-02 MED ORDER — IOHEXOL 300 MG/ML  SOLN
75.0000 mL | Freq: Once | INTRAMUSCULAR | Status: AC | PRN
  Administered 2018-08-02: 17:00:00 75 mL via INTRAVENOUS

## 2018-08-02 MED ORDER — MORPHINE SULFATE (PF) 2 MG/ML IV SOLN
2.0000 mg | INTRAVENOUS | Status: DC | PRN
  Administered 2018-08-02 – 2018-08-06 (×10): 2 mg via INTRAVENOUS
  Filled 2018-08-02 (×10): qty 1

## 2018-08-02 MED ORDER — SODIUM CHLORIDE 0.9 % IV BOLUS
2000.0000 mL | Freq: Once | INTRAVENOUS | Status: AC
  Administered 2018-08-02: 16:00:00 2000 mL via INTRAVENOUS

## 2018-08-02 MED ORDER — ONDANSETRON HCL 4 MG/2ML IJ SOLN
4.0000 mg | Freq: Four times a day (QID) | INTRAMUSCULAR | Status: DC | PRN
  Administered 2018-08-02 – 2018-08-06 (×10): 4 mg via INTRAVENOUS
  Filled 2018-08-02 (×10): qty 2

## 2018-08-02 MED ORDER — INSULIN ASPART 100 UNIT/ML ~~LOC~~ SOLN
0.0000 [IU] | Freq: Three times a day (TID) | SUBCUTANEOUS | Status: DC
  Administered 2018-08-03 – 2018-08-05 (×6): 1 [IU] via SUBCUTANEOUS

## 2018-08-02 MED ORDER — ONDANSETRON HCL 4 MG/2ML IJ SOLN
4.0000 mg | Freq: Once | INTRAMUSCULAR | Status: AC
  Administered 2018-08-02: 4 mg via INTRAVENOUS
  Filled 2018-08-02: qty 2

## 2018-08-02 MED ORDER — LABETALOL HCL 5 MG/ML IV SOLN: 10.0000 mg | INTRAVENOUS | Status: DC | PRN

## 2018-08-02 MED ORDER — ALBUTEROL SULFATE (2.5 MG/3ML) 0.083% IN NEBU: 3.0000 mL | INHALATION_SOLUTION | RESPIRATORY_TRACT | Status: DC | PRN

## 2018-08-02 MED ORDER — ONDANSETRON HCL 4 MG/2ML IJ SOLN
4.0000 mg | Freq: Once | INTRAMUSCULAR | Status: AC
  Administered 2018-08-02: 16:00:00 4 mg via INTRAVENOUS
  Filled 2018-08-02: qty 2

## 2018-08-02 MED ORDER — LIDOCAINE VISCOUS HCL 2 % MT SOLN
OROMUCOSAL | Status: AC
  Filled 2018-08-02: qty 15

## 2018-08-02 MED ORDER — ORAL CARE MOUTH RINSE
15.0000 mL | Freq: Two times a day (BID) | OROMUCOSAL | Status: DC
  Administered 2018-08-03 – 2018-08-05 (×5): 15 mL via OROMUCOSAL

## 2018-08-02 MED ORDER — FENTANYL CITRATE (PF) 100 MCG/2ML IJ SOLN
100.0000 ug | INTRAMUSCULAR | Status: DC | PRN
  Administered 2018-08-02 (×2): 100 ug via INTRAVENOUS
  Filled 2018-08-02 (×2): qty 2

## 2018-08-02 NOTE — ED Notes (Signed)
Patient transported to CT 

## 2018-08-02 NOTE — H&P (Addendum)
History and Physical    [name] TKW:[id number] DOB: Apr 29, 1950 DOA: 08/02/2018  PCP: Redmond School, MD   Patient coming from: Home  I have personally briefly reviewed patient's old medical records in [place]  Chief Complaint: Abdominal Pain, Vomiting  HPI: [name] is a 68 y.o. female with medical history significant for type II DM, COPD-on 3-1/2 L home O2, hypertension.  Patient presented to the ED with complaints of 4 days of generalized abdominal pain, with multiple episodes of vomiting.  She reports her last bowel movement was-3 days ago on Saturday- 4/11. She has a history of small bowel obstruction and multiple abdominal surgeries. Patient denies difficulty breathing or cough, no sore throat, no flulike symptoms, no sick contacts, she has been home over the past few weeks.  ED Course: Initial tachycardia to 117 resolved, respiratory rate 18- 26, O2 sats 88% on room air patient placed on nasal cannula with O2 sats greater than 92%. WBC- 9.  Mild Creatinine elevation 1.38, baseline 0.6-1.  With contrast showed mechanical small bowel obstruction in the mid to distal small bowel.  2 discrete transition zones.  Multiple adhesions evident in the region of obstruction.  No pneumatosis or small bowel thickening.  Stable liver lesion.  ( Pls see detailed report).  Gen surgery was consulted, Dr. Arnoldo Morale will see patient in a.m.  NG tube ordered in ED.  2L plus fluids given.  Hospitalist to admit for small bowel obstruction.   Review of Systems: As per HPI all other systems reviewed and negative.  Past Medical History:  Diagnosis Date  . Arthritis    "hands, knees, feet"  . Asthma   . Colon polyps   . COPD (chronic obstructive pulmonary disease) ([place])   . Hyperlipidemia   . Hypertension   . Peripheral edema   . Pneumonia 08/2010; 11/2010  . SBO (small bowel obstruction) ([place]) 08/25/11   recurrent  . Type II diabetes mellitus ([place])     Past Surgical History:   Procedure Laterality Date  . APPENDECTOMY    . CHOLECYSTECTOMY  1979  . COLONOSCOPY  2008   Dr. Oneida Alar: 46mm sessile cecal polyp and 74mm sessile transverse colon polyp, torturous sigmoid colon, fragments of tubular adenomas on path   . COLONOSCOPY WITH PROPOFOL N/A 08/13/2015   Procedure: COLONOSCOPY WITH PROPOFOL;  Surgeon: Danie Binder, MD;  Location: [facility];  Service: Endoscopy;  Laterality: N/A;  [id number]  . ESOPHAGEAL DILATION     remote past by Dr. Tamala Julian  . HERNIA REPAIR    . KNEE CARTILAGE SURGERY  07/2003   right/E-chart  . LAPAROSCOPIC INCISIONAL / UMBILICAL / VENTRAL HERNIA REPAIR  ? til 2002   "~ 13 times"  . POLYPECTOMY  08/13/2015   Procedure: POLYPECTOMY;  Surgeon: Danie Binder, MD;  Location: [facility];  Service: Endoscopy;;  ascending colon polyp, hepatic flexure polyp, transverse colon polyp, descending colon polyp, sigmoid colon polyp, rectal polyps times 3  . SMALL INTESTINE SURGERY     Feb 2015 at [facility] and then remote past   . [place]     reports that she has been smoking cigarettes. She has a 42.00 pack-year smoking history. She quit smokeless tobacco use about 3 years ago. She reports that she does not drink alcohol or use drugs.  Allergies  Allergen Reactions  . Hydrocodone-Acetaminophen Other (See Comments)    Itching. Pt states she can tolerate acetaminophen  . Penicillins Hives and Itching  Has patient had a PCN reaction causing immediate rash, facial/tongue/throat swelling, SOB or lightheadedness with hypotension: Yes Has patient had a PCN reaction causing severe rash involving mucus membranes or skin necrosis: No Has patient had a PCN reaction that required hospitalization No Has patient had a PCN reaction occurring within the last 10 years: No If all of the above answers are "NO", then may proceed with Cephalosporin use.  Has patient had a PCN reaction causing immediate rash, facial/tongue/throat swelling, SOB or lightheadedness  with hypotension: Yes Has patient had a PCN reaction causing severe rash involving mucus membranes or skin necrosis: No Has patient had a PCN reaction that required hospitalization No Has patient had a PCN reaction occurring within the last 10 years: No If all of the above answers are "NO", then may proceed with Cephalosporin use. Can tolerate cephalosporins, per pt Electronically signed by: Rudene Christians, [facility] 06/07/2013 10:43 AM  . Vicodin [Hydrocodone-Acetaminophen]     itching    Family History  Problem Relation Age of Onset  . Colon cancer Mother 45    Prior to Admission medications   Medication Sig Start Date End Date Taking? Authorizing Provider  acetaminophen (TYLENOL) 325 MG tablet Take 2 tablets (650 mg total) by mouth every 6 (six) hours as needed for mild pain, fever or headache. 05/14/18   Denton Brick, [place], MD  albuterol (PROVENTIL HFA;VENTOLIN HFA) 108 (90 Base) MCG/ACT inhaler Inhale 2 puffs into the lungs every 4 (four) hours as needed. For shortness of breath 05/14/18   Roxan Hockey, MD  albuterol (PROVENTIL) (2.5 MG/3ML) 0.083% nebulizer solution Take 3 mLs (2.5 mg total) by nebulization every 6 (six) hours as needed for wheezing or shortness of breath. 05/14/18   Solita Macadam, [place], MD  busPIRone (BUSPAR) 10 MG tablet Take 10 mg by mouth 2 (two) times daily.  05/31/15   [provider]  citalopram (CELEXA) 20 MG tablet Take 20 mg by mouth daily. 11/04/17   [provider]  enalapril (VASOTEC) 20 MG tablet Take 20 mg by mouth daily.    [provider]  Fluticasone-Salmeterol (ADVAIR DISKUS) 250-50 MCG/DOSE AEPB Inhale 1 puff into the lungs 2 (two) times daily. 11/07/17 11/07/18  Kinnie Feil, MD  gabapentin (NEURONTIN) 300 MG capsule Take 300 mg by mouth 3 (three) times daily.  02/03/14   [provider]  glipiZIDE (GLUCOTROL) 10 MG tablet Take 1 tablet (10 mg total) by mouth 2 (two) times daily before a meal. 05/14/18   Denessa Cavan,  Courage, MD  lovastatin (MEVACOR) 40 MG tablet Take 40 mg by mouth at bedtime.    [provider]  metFORMIN (GLUCOPHAGE) 1000 MG tablet Take 1 tablet (1,000 mg total) by mouth 2 (two) times daily with a meal. 05/14/18   Leather Estis, [place], MD  oseltamivir (TAMIFLU) 75 MG capsule Take 1 capsule (75 mg total) by mouth 2 (two) times daily. 05/14/18   Roxan Hockey, MD  predniSONE (DELTASONE) 20 MG tablet Take 2 tablets (40 mg total) by mouth daily with breakfast. 05/14/18   Ladawn Boullion, [place], MD  sitaGLIPtin (JANUVIA) 50 MG tablet Take 1 tablet (50 mg total) by mouth daily. 05/14/18   Roxan Hockey, MD  tiotropium (SPIRIVA HANDIHALER) 18 MCG inhalation capsule Place 1 capsule (18 mcg total) into inhaler and inhale daily. 11/07/17 11/07/18  Kinnie Feil, MD  tiZANidine (ZANAFLEX) 4 MG tablet 4 mg.  03/08/18   [provider]  traMADol (ULTRAM) 50 MG tablet Take 50 mg by mouth every 8 (eight) hours  as needed for moderate pain.    [provider]    Physical Exam: Vitals:   08/02/18 1556 08/02/18 1559 08/02/18 1758 08/02/18 1900  BP:   (!) 164/60 (!) 139/50  Pulse: (!) 109  (!) 108 (!) 101  Resp: (!) 26  (!) 25 (!) 22  Temp:   98.1 F (36.7 C)   TempSrc:   Oral   SpO2: (!) 88% 92% 93%   Weight:      Height:        Constitutional: NAD, calm, comfortable Vitals:   08/02/18 1556 08/02/18 1559 08/02/18 1758 08/02/18 1900  BP:   (!) 164/60 (!) 139/50  Pulse: (!) 109  (!) 108 (!) 101  Resp: (!) 26  (!) 25 (!) 22  Temp:   98.1 F (36.7 C)   TempSrc:   Oral   SpO2: (!) 88% 92% 93%   Weight:      Height:       Eyes: PERRL, lids and conjunctivae normal ENMT: Mucous membranes are dry. Posterior pharynx clear of any exudate or lesions. Neck: normal, supple, no masses, no thyromegaly Respiratory: Normal respiratory effort. No accessory muscle use.  Cardiovascular: Regular rate and rhythm, no murmurs / rubs / gallops. No extremity edema. 2+ pedal pulses.  Abdomen:  Mild diffuse tenderness, no masses palpated. Abdomen soft.  No hepatosplenomegaly.  Musculoskeletal: no clubbing / cyanosis. No joint deformity upper and lower extremities. Good ROM, no contractures. Normal muscle tone.  Skin: no rashes, lesions, ulcers. No induration Neurologic: CN 2-12 grossly intact.  Strength 5/5 in all 4.  Psychiatric: Normal judgment and insight. Alert and oriented x 3. Normal mood.   Labs on Admission: I have personally reviewed following labs and imaging studies  CBC: Recent Labs  Lab 08/02/18 1424  WBC 9.0  HGB 14.9  HCT 44.2  MCV 91.7  PLT 295   Basic Metabolic Panel: Recent Labs  Lab 08/02/18 1424  NA 134*  K 4.5  CL 87*  CO2 32  GLUCOSE 191*  BUN 66*  CREATININE 1.38*  CALCIUM 9.1   Liver Function Tests: Recent Labs  Lab 08/02/18 1424  AST 12*  ALT 17  ALKPHOS 60  BILITOT 0.9  PROT 7.5  ALBUMIN 3.9   Recent Labs  Lab 08/02/18 1424  LIPASE 19   Urine analysis:    Component Value Date/Time   COLORURINE YELLOW 08/02/2018 1315   APPEARANCEUR CLEAR 08/02/2018 1315   LABSPEC 1.041 (H) 08/02/2018 1315   PHURINE 5.0 08/02/2018 1315   GLUCOSEU NEGATIVE 08/02/2018 1315   [place] 08/02/2018 1315   BILIRUBINUR NEGATIVE 08/02/2018 1315   KETONESUR 20 (A) 08/02/2018 1315   PROTEINUR NEGATIVE 08/02/2018 1315   UROBILINOGEN 0.2 01/04/2011 0914   NITRITE NEGATIVE 08/02/2018 1315   LEUKOCYTESUR NEGATIVE 08/02/2018 1315    Radiological Exams on Admission: Ct Abdomen Pelvis W Contrast  Result Date: 08/02/2018 CLINICAL DATA:  Abdominal pain and vomiting for 4 days. EXAM: CT ABDOMEN AND PELVIS WITH CONTRAST TECHNIQUE: Multidetector CT imaging of the abdomen and pelvis was performed using the standard protocol following bolus administration of intravenous contrast. CONTRAST:  16mL OMNIPAQUE IOHEXOL 300 MG/ML  SOLN COMPARISON:  08/21/2017.  PET-CT 01/07/2018. FINDINGS: Lower chest: Unremarkable Hepatobiliary: 12 mm low-density lesion  inferior right liver is stable since 08/21/2017. Mild prominence of the intrahepatic bile ducts is stable. Gallbladder surgically absent. Common bile duct measures 9 mm diameter, similar to prior study. Pancreas: No focal mass lesion. No dilatation of the main  duct. No intraparenchymal cyst. No peripancreatic edema. Spleen: No splenomegaly. No focal mass lesion. Adrenals/Urinary Tract: No adrenal nodule or mass. Kidneys unremarkable. No evidence for hydroureter. The urinary bladder appears normal for the degree of distention. Stomach/Bowel: Stomach is unremarkable. No gastric wall thickening. No evidence of outlet obstruction. Duodenum is normally positioned as is the ligament of Treitz. Jejunal loops are dilated and fluid-filled measuring up to 5.8 cm diameter. In the mid to distal small bowel, and abrupt transition zone is identified (axial 67/series 2 and coronal image 41/series 7). Small bowel then becomes dilated again, in the region of a small bowel anastomosis. Approximately 20 cm distal to the first transition zone a second abrupt transition is identified (axial 61/2 and coronal 46/7). There are some adhesions in the region of both transition zones with evidence of tethering to the sigmoid colon. Small bowel distal to the second transition point is completely decompressed into the ileocecal valve. Colon is largely decompressed throughout. Vascular/Lymphatic: There is abdominal aortic atherosclerosis without aneurysm. There is no gastrohepatic or hepatoduodenal ligament lymphadenopathy. No intraperitoneal or retroperitoneal lymphadenopathy. No pelvic sidewall lymphadenopathy. Reproductive: The uterus is unremarkable.  There is no adnexal mass. Other: No intraperitoneal free fluid. Musculoskeletal: Nodularity in the anterior abdominal wall is similar to prior and may be from prior sites of injection. No worrisome lytic or sclerotic osseous abnormality. Mild compression deformity at T11 is stable. IMPRESSION:  1. Mechanical small bowel obstruction in the mid to distal small bowel. 2 discrete transition zones are identified with about 20 cm intervening dilated fluid-filled small bowel. Multiple adhesions are evident in the region of obstruction and obstructive etiology may be related to the adhesions although closed loop obstruction and internal hernia with obstruction can not be excluded. No appreciable small bowel wall thickening or pneumatosis. No substantial intraperitoneal free fluid. 2. Stable 12 mm low-density lesion inferior right liver, indeterminate. This showed no hypermetabolism on recent PET-CT. 3.  Aortic Atherosclerois (ICD10-170.0) Electronically Signed   By: Misty Stanley M.D.   On: 08/02/2018 17:29    EKG: None.   Assessment/Plan Active Problems:   SBO (small bowel obstruction) (HCC)   Small bowel obstruction-no pain, vomiting, last bowel movement 3 days ago.  History of SBO, multiple abdominal surgeries.  Abdominal CT shows 2 discrete transition zones.  Multiple adhesions.  No appreciable small bowel thickening or pneumatosis. (Pls see detailed report).  2L bolus given in ED. Surgery consulted in ED. - N.p.o. - NG tube-patient declined. - F/u  gensurgery recommendations - [place] Lactate 100cc/hr  - BMP, Mag a.m - PRN morphine  Mild Acute kidney injury-1.8.  Baseline 0.6-1.  Likely from prerenal from vomiting, p.o. intake. 2L bolus given in ED. -Hydrate -BMP a.m.   COPD-chronic respiratory failure.  Appears stable.  On chronic 3-1/2 L O2. - Cont new home O2, Albuterol, advair inhaler  DM-random glucose 191. 05/13/18- Hgba1c- 7.4.  - SSI -Hold home metformin, glipizide and Januvia, while n.p.o. and pending med reconciliation  Hypertension-stable blood pressure -Hold home enalapril while n.p.o. -PRN labetalol 10mg   Anxiety/depression-  -Hold home Celexa and buspirone while n.p.o, pending med reconciliation  DVT prophylaxis: Scds- pending surg eval Code Status: Full  Family Communication: None at bedside Disposition Plan: Per rounding team Consults called: Gen Surg Admission status: Obs, tele   Bethena Roys MD [facility]  08/02/2018, 7:26 PM

## 2018-08-02 NOTE — ED Notes (Signed)
Pt nauseated, discussed benefit of NG tube placement. Pt states, "My husband died from a heart attack with a NG tube in place and I don't want one. I am not doing that" Pt informed that nausea medications may not be as effective due to SBO and that the NG tube can help make the nausea better. Pt refused.

## 2018-08-02 NOTE — ED Provider Notes (Signed)
[facility] EMERGENCY DEPARTMENT Provider Note   CSN: [id number] Arrival date & time: 08/02/18  1307    History   Chief Complaint Chief Complaint  Patient presents with  . Abdominal Pain    HPI [name] is a 68 y.o. female.     HPI She presents for evaluation of vomiting ongoing for several days, and feeling like she has a small bowel obstruction.  Last time she had a small bowel obstruction was about 1 year ago.  She has had multiple abdominal surgical procedures and has mesh in her abdomen.  She denies blood in emesis, fever, chills, cough, sore throat, diarrhea, rash, dizziness or weakness.  She denies known sick contacts.  He has not traveled outside of the state.  There are no other known modifying factors.    Past Medical History:  Diagnosis Date  . Arthritis    "hands, knees, feet"  . Asthma   . Colon polyps   . COPD (chronic obstructive pulmonary disease) ([place])   . Hyperlipidemia   . Hypertension   . Peripheral edema   . Pneumonia 08/2010; 11/2010  . SBO (small bowel obstruction) ([place]) 08/25/11   recurrent  . Type II diabetes mellitus [facility])     Patient Active Problem List   Diagnosis Date Noted  . Acute on chronic respiratory failure with hypoxia ([place]) 05/12/2018  . Influenza A 05/12/2018  . HLD (hyperlipidemia) 05/12/2018  . Thrombocytopenia ([place]) 05/12/2018  . COPD exacerbation ([place]) 11/06/2017  . Back pain 11/06/2017  . Pulmonary nodule, right 11/06/2017  . History of colonic polyps 07/25/2015  . FH: colon cancer 07/25/2015  . Incarcerated ventral hernia 04/27/2013  . SBO (small bowel obstruction) ([place]) 08/25/2011  . Hypoxia 08/25/2011  . Leucocytosis 08/25/2011  . HTN (hypertension) 08/25/2011  . COPD (chronic obstructive pulmonary disease) ([place]) 08/25/2011  . TRIGGER FINGER 06/11/2009  . KNEE, ARTHRITIS, DEGEN./OSTEO 09/08/2007  . TIBIALIS TENDINITIS 02/23/2007  . Type 2 diabetes mellitus without complication ([place]) [id number]/9628    Past  Surgical History:  Procedure Laterality Date  . APPENDECTOMY    . CHOLECYSTECTOMY  1979  . COLONOSCOPY  2008   Dr. Oneida Alar: 56mm sessile cecal polyp and 18mm sessile transverse colon polyp, torturous sigmoid colon, fragments of tubular adenomas on path   . COLONOSCOPY WITH PROPOFOL N/A 08/13/2015   Procedure: COLONOSCOPY WITH PROPOFOL;  Surgeon: Danie Binder, MD;  Location: [facility] ENDO [facility];  Service: Endoscopy;  Laterality: N/A;  [id number]  . ESOPHAGEAL DILATION     remote past by Dr. Tamala Julian  . HERNIA REPAIR    . KNEE CARTILAGE SURGERY  07/2003   right/E-chart  . LAPAROSCOPIC INCISIONAL / UMBILICAL / VENTRAL HERNIA REPAIR  ? til 2002   "~ 13 times"  . POLYPECTOMY  08/13/2015   Procedure: POLYPECTOMY;  Surgeon: Danie Binder, MD;  Location: [facility] ENDO SUITE;  Service: Endoscopy;;  ascending colon polyp, hepatic flexure polyp, transverse colon polyp, descending colon polyp, sigmoid colon polyp, rectal polyps times 3  . SMALL INTESTINE SURGERY     Feb 2015 at [facility] and then remote past   . [place]     OB History    Gravida      Para      Term      Preterm      AB      Living  1     SAB      TAB      Ectopic  Multiple      Live Births               Home Medications    Prior to Admission medications   Medication Sig Start Date End Date Taking? Authorizing Provider  acetaminophen (TYLENOL) 325 MG tablet Take 2 tablets (650 mg total) by mouth every 6 (six) hours as needed for mild pain, fever or headache. 05/14/18   Denton Brick, Courage, MD  albuterol (PROVENTIL HFA;VENTOLIN HFA) 108 (90 Base) MCG/ACT inhaler Inhale 2 puffs into the lungs every 4 (four) hours as needed. For shortness of breath 05/14/18   Roxan Hockey, MD  albuterol (PROVENTIL) (2.5 MG/3ML) 0.083% nebulizer solution Take 3 mLs (2.5 mg total) by nebulization every 6 (six) hours as needed for wheezing or shortness of breath. 05/14/18   Emokpae, Courage, MD  busPIRone (BUSPAR) 10 MG tablet Take 10 mg  by mouth 2 (two) times daily.  05/31/15   [provider]  citalopram (CELEXA) 20 MG tablet Take 20 mg by mouth daily. 11/04/17   [provider]  enalapril (VASOTEC) 20 MG tablet Take 20 mg by mouth daily.    [provider]  Fluticasone-Salmeterol (ADVAIR DISKUS) 250-50 MCG/DOSE AEPB Inhale 1 puff into the lungs 2 (two) times daily. 11/07/17 11/07/18  Kinnie Feil, MD  gabapentin (NEURONTIN) 300 MG capsule Take 300 mg by mouth 3 (three) times daily.  02/03/14   [provider]  glipiZIDE (GLUCOTROL) 10 MG tablet Take 1 tablet (10 mg total) by mouth 2 (two) times daily before a meal. 05/14/18   Emokpae, Courage, MD  lovastatin (MEVACOR) 40 MG tablet Take 40 mg by mouth at bedtime.    [provider]  metFORMIN (GLUCOPHAGE) 1000 MG tablet Take 1 tablet (1,000 mg total) by mouth 2 (two) times daily with a meal. 05/14/18   Emokpae, Courage, MD  oseltamivir (TAMIFLU) 75 MG capsule Take 1 capsule (75 mg total) by mouth 2 (two) times daily. 05/14/18   Roxan Hockey, MD  predniSONE (DELTASONE) 20 MG tablet Take 2 tablets (40 mg total) by mouth daily with breakfast. 05/14/18   Emokpae, Courage, MD  sitaGLIPtin (JANUVIA) 50 MG tablet Take 1 tablet (50 mg total) by mouth daily. 05/14/18   Roxan Hockey, MD  tiotropium (SPIRIVA HANDIHALER) 18 MCG inhalation capsule Place 1 capsule (18 mcg total) into inhaler and inhale daily. 11/07/17 11/07/18  Kinnie Feil, MD  tiZANidine (ZANAFLEX) 4 MG tablet 4 mg.  03/08/18   [provider]  traMADol (ULTRAM) 50 MG tablet Take 50 mg by mouth every 8 (eight) hours as needed for moderate pain.    [provider]    Family History Family History  Problem Relation Age of Onset  . Colon cancer Mother 63    Social History Social History   Tobacco Use  . Smoking status: Current Every Day Smoker    Packs/day: 1.00    Years: 42.00    Pack years: 42.00    Types: Cigarettes  . Smokeless tobacco:  Former Systems developer    Quit date: 10/30/2014  Substance Use Topics  . Alcohol use: No    Alcohol/week: 0.0 standard drinks  . Drug use: No     Allergies   Hydrocodone-acetaminophen; Penicillins; and Vicodin [hydrocodone-acetaminophen]   Review of Systems Review of Systems  All other systems reviewed and are negative.    Physical Exam Updated Vital Signs BP (!) 164/60   Pulse (!) 108   Temp 98.1 F (36.7 C) (Oral)  Resp (!) 25   Ht 5' 4.5" (1.638 m)   Wt 81.2 kg   SpO2 93%   BMI 30.25 kg/m   Physical Exam Vitals signs and nursing note reviewed.  Constitutional:      General: She is not in acute distress.    Appearance: She is well-developed. She is ill-appearing. She is not toxic-appearing or diaphoretic.  HENT:     Head: Normocephalic and atraumatic.     Comments: Dry oral mucous membranes    Right Ear: External ear normal.     Left Ear: External ear normal.     Mouth/Throat:     Pharynx: No oropharyngeal exudate.  Eyes:     Conjunctiva/sclera: Conjunctivae normal.     Pupils: Pupils are equal, round, and reactive to light.  Neck:     Musculoskeletal: Normal range of motion and neck supple.     Trachea: Phonation normal.  Cardiovascular:     Rate and Rhythm: Normal rate and regular rhythm.     Heart sounds: Normal heart sounds. No murmur.  Pulmonary:     Effort: Pulmonary effort is normal. No respiratory distress.     Breath sounds: Normal breath sounds. No stridor.  Abdominal:     General: A surgical scar is present. There is no distension. There are no signs of injury.     Palpations: Abdomen is soft.     Tenderness: There is abdominal tenderness (Bilateral lower quadrants, mild).     Hernia: No hernia is present.  Musculoskeletal: Normal range of motion.  Skin:    General: Skin is warm and dry.     Coloration: Skin is not cyanotic or jaundiced.  Neurological:     General: No focal deficit present.     Mental Status: She is alert and oriented to person,  place, and time.     Cranial Nerves: No cranial nerve deficit.     Sensory: No sensory deficit.     Motor: No abnormal muscle tone.     Coordination: Coordination normal.  Psychiatric:        Mood and Affect: Mood normal. Mood is not anxious.        Behavior: Behavior normal.        Thought Content: Thought content normal.        Judgment: Judgment normal.      ED Treatments / Results  Labs (all labs ordered are listed, but only abnormal results are displayed) Labs Reviewed  COMPREHENSIVE METABOLIC PANEL - Abnormal; Notable for the following components:      Result Value   Sodium 134 (*)    Chloride 87 (*)    Glucose, Bld 191 (*)    BUN 66 (*)    Creatinine, Ser 1.38 (*)    AST 12 (*)    GFR calc non Af Amer 40 (*)    GFR calc Af Amer 46 (*)    All other components within normal limits  URINALYSIS, ROUTINE W REFLEX MICROSCOPIC - Abnormal; Notable for the following components:   Specific Gravity, Urine 1.041 (*)    Ketones, ur 20 (*)    All other components within normal limits  LIPASE, BLOOD  CBC    EKG None  Radiology Ct Abdomen Pelvis W Contrast  Result Date: 08/02/2018 CLINICAL DATA:  Abdominal pain and vomiting for 4 days. EXAM: CT ABDOMEN AND PELVIS WITH CONTRAST TECHNIQUE: Multidetector CT imaging of the abdomen and pelvis was performed using the standard protocol following bolus administration of intravenous contrast.  CONTRAST:  40mL OMNIPAQUE IOHEXOL 300 MG/ML  SOLN COMPARISON:  08/21/2017.  PET-CT 01/07/2018. FINDINGS: Lower chest: Unremarkable Hepatobiliary: 12 mm low-density lesion inferior right liver is stable since 08/21/2017. Mild prominence of the intrahepatic bile ducts is stable. Gallbladder surgically absent. Common bile duct measures 9 mm diameter, similar to prior study. Pancreas: No focal mass lesion. No dilatation of the main duct. No intraparenchymal cyst. No peripancreatic edema. Spleen: No splenomegaly. No focal mass lesion. Adrenals/Urinary Tract:  No adrenal nodule or mass. Kidneys unremarkable. No evidence for hydroureter. The urinary bladder appears normal for the degree of distention. Stomach/Bowel: Stomach is unremarkable. No gastric wall thickening. No evidence of outlet obstruction. Duodenum is normally positioned as is the ligament of Treitz. Jejunal loops are dilated and fluid-filled measuring up to 5.8 cm diameter. In the mid to distal small bowel, and abrupt transition zone is identified (axial 67/series 2 and coronal image 41/series 7). Small bowel then becomes dilated again, in the region of a small bowel anastomosis. Approximately 20 cm distal to the first transition zone a second abrupt transition is identified (axial 61/2 and coronal 46/7). There are some adhesions in the region of both transition zones with evidence of tethering to the sigmoid colon. Small bowel distal to the second transition point is completely decompressed into the ileocecal valve. Colon is largely decompressed throughout. Vascular/Lymphatic: There is abdominal aortic atherosclerosis without aneurysm. There is no gastrohepatic or hepatoduodenal ligament lymphadenopathy. No intraperitoneal or retroperitoneal lymphadenopathy. No pelvic sidewall lymphadenopathy. Reproductive: The uterus is unremarkable.  There is no adnexal mass. Other: No intraperitoneal free fluid. Musculoskeletal: Nodularity in the anterior abdominal wall is similar to prior and may be from prior sites of injection. No worrisome lytic or sclerotic osseous abnormality. Mild compression deformity at T11 is stable. IMPRESSION: 1. Mechanical small bowel obstruction in the mid to distal small bowel. 2 discrete transition zones are identified with about 20 cm intervening dilated fluid-filled small bowel. Multiple adhesions are evident in the region of obstruction and obstructive etiology may be related to the adhesions although closed loop obstruction and internal hernia with obstruction can not be excluded. No  appreciable small bowel wall thickening or pneumatosis. No substantial intraperitoneal free fluid. 2. Stable 12 mm low-density lesion inferior right liver, indeterminate. This showed no hypermetabolism on recent PET-CT. 3.  Aortic Atherosclerois (ICD10-170.0) Electronically Signed   By: Misty Stanley M.D.   On: 08/02/2018 17:29    Procedures .Critical Care Performed by: Daleen Bo, MD Authorized by: Daleen Bo, MD   Critical care provider statement:    Critical care time (minutes):  35   Critical care start time:  08/02/2018 3:30 PM   Critical care end time:  08/02/2018 6:22 PM   Critical care time was exclusive of:  Separately billable procedures and treating other patients   Critical care was time spent personally by me on the following activities:  Blood draw for specimens, development of treatment plan with patient or surrogate, discussions with consultants, evaluation of patient's response to treatment, examination of patient, obtaining history from patient or surrogate, ordering and performing treatments and interventions, ordering and review of laboratory studies, pulse oximetry, re-evaluation of patient's condition, review of old charts and ordering and review of radiographic studies   (including critical care time)  Medications Ordered in ED Medications  sodium chloride flush (NS) 0.9 % injection 3 mL (has no administration in time range)  fentaNYL (SUBLIMAZE) injection 100 mcg (100 mcg Intravenous Given 08/02/18 1817)  lidocaine (XYLOCAINE) 2 %  viscous mouth solution (has no administration in time range)  sodium chloride 0.9 % bolus 2,000 mL (0 mLs Intravenous Stopped 08/02/18 1753)  ondansetron (ZOFRAN) injection 4 mg (4 mg Intravenous Given 08/02/18 1618)  iohexol (OMNIPAQUE) 300 MG/ML solution 75 mL (75 mLs Intravenous Contrast Given 08/02/18 1653)  ondansetron (ZOFRAN) injection 4 mg (4 mg Intravenous Given 08/02/18 1816)     Initial Impression / Assessment and Plan / ED  Course  I have reviewed the triage vital signs and the nursing notes.  Pertinent labs & imaging results that were available during my care of the patient were reviewed by me and considered in my medical decision making (see chart for details).  Clinical Course as of Aug 02 1851  Tue Aug 02, 2018  1805 NG tube ordered, patient declined.  She stated "my husband died when he had an NG tube."  I encouraged her to let us know when the nausea and vomiting became intolerable and we could start the NG tube later.   [EW]  0254 Consistent with small bowel obstruction, images reviewed by me.  CT Abdomen Pelvis W Contrast [EW]  2706 Normal  Lipase, blood [EW]  1811 Normal  CBC [EW]  2376 Normal except sodium low, chloride low, glucose high, BUN high creatinine high, GFR low  Comprehensive metabolic panel(!) [EW]  2831 I discussed the case with general surgery, Dr. Arnoldo Morale.  He will evaluate the patient in the morning, as a Optometrist.  There is no indication for surgery at this time.   [EW]    Clinical Course User Index [EW] Daleen Bo, MD        Today's Vitals   08/02/18 1556 08/02/18 1559 08/02/18 1754 08/02/18 1758  BP:    (!) 164/60  Pulse: (!) 109   (!) 108  Resp: (!) 26   (!) 25  Temp:    98.1 F (36.7 C)  TempSrc:    Oral  SpO2: (!) 88% 92%  93%  Weight:      Height:      PainSc:   5  5    Body mass index is 30.25 kg/m.  6:21 PM Reevaluation with update and discussion. After initial assessment and treatment, an updated evaluation reveals patient requires additional medication for nausea and vomiting, and continues to decline NG tube placement.  She agrees to hospitalization for further treatment and evaluation. Daleen Bo   Medical decision making: Recurrent small bowel obstruction.  Patient declined NG tube placement in the emergency department.  Mild renal insufficiency with elevated creatinine indicating acute kidney injury.  No evidence for significant bacterial  infection, or metabolic instability.  Doubt bowel ischemia.  CRITICAL CARE-yes Performed by: Daleen Bo   Nursing Notes Reviewed/ Care Coordinated Applicable Imaging Reviewed Interpretation of Laboratory Data incorporated into ED treatment  6:18 PM-Consult complete with hospitalist. Patient case explained and discussed.  She agrees to admit patient for further evaluation and treatment. Call ended at 6:51 PM   Plan: Admit   Final Clinical Impressions(s) / ED Diagnoses   Final diagnoses:  Small bowel obstruction ([place])  AKI (acute kidney injury) [facility])    ED Discharge Orders    None       Daleen Bo, MD 08/02/18 1853

## 2018-08-02 NOTE — ED Triage Notes (Signed)
Patient complaining of abdominal pain and vomiting x 4 days.

## 2018-08-02 NOTE — ED Notes (Signed)
Pt refused NG tube

## 2018-08-03 DIAGNOSIS — N179 Acute kidney failure, unspecified: Secondary | ICD-10-CM | POA: Diagnosis present

## 2018-08-03 DIAGNOSIS — Z7952 Long term (current) use of systemic steroids: Secondary | ICD-10-CM | POA: Diagnosis not present

## 2018-08-03 DIAGNOSIS — Z880 Allergy status to penicillin: Secondary | ICD-10-CM | POA: Diagnosis not present

## 2018-08-03 DIAGNOSIS — Z8249 Family history of ischemic heart disease and other diseases of the circulatory system: Secondary | ICD-10-CM | POA: Diagnosis not present

## 2018-08-03 DIAGNOSIS — Z885 Allergy status to narcotic agent status: Secondary | ICD-10-CM | POA: Diagnosis not present

## 2018-08-03 DIAGNOSIS — J449 Chronic obstructive pulmonary disease, unspecified: Secondary | ICD-10-CM | POA: Diagnosis present

## 2018-08-03 DIAGNOSIS — K769 Liver disease, unspecified: Secondary | ICD-10-CM | POA: Diagnosis present

## 2018-08-03 DIAGNOSIS — Z7984 Long term (current) use of oral hypoglycemic drugs: Secondary | ICD-10-CM | POA: Diagnosis not present

## 2018-08-03 DIAGNOSIS — K56609 Unspecified intestinal obstruction, unspecified as to partial versus complete obstruction: Secondary | ICD-10-CM | POA: Diagnosis present

## 2018-08-03 DIAGNOSIS — E119 Type 2 diabetes mellitus without complications: Secondary | ICD-10-CM

## 2018-08-03 DIAGNOSIS — Z79899 Other long term (current) drug therapy: Secondary | ICD-10-CM | POA: Diagnosis not present

## 2018-08-03 DIAGNOSIS — Z86010 Personal history of colonic polyps: Secondary | ICD-10-CM | POA: Diagnosis not present

## 2018-08-03 DIAGNOSIS — I10 Essential (primary) hypertension: Secondary | ICD-10-CM | POA: Diagnosis present

## 2018-08-03 DIAGNOSIS — Z9981 Dependence on supplemental oxygen: Secondary | ICD-10-CM | POA: Diagnosis not present

## 2018-08-03 DIAGNOSIS — M1990 Unspecified osteoarthritis, unspecified site: Secondary | ICD-10-CM | POA: Diagnosis present

## 2018-08-03 DIAGNOSIS — J9611 Chronic respiratory failure with hypoxia: Secondary | ICD-10-CM | POA: Diagnosis present

## 2018-08-03 DIAGNOSIS — F17210 Nicotine dependence, cigarettes, uncomplicated: Secondary | ICD-10-CM | POA: Diagnosis present

## 2018-08-03 DIAGNOSIS — E785 Hyperlipidemia, unspecified: Secondary | ICD-10-CM | POA: Diagnosis present

## 2018-08-03 DIAGNOSIS — Z800 Family history of malignant neoplasm of digestive organs: Secondary | ICD-10-CM | POA: Diagnosis not present

## 2018-08-03 DIAGNOSIS — F419 Anxiety disorder, unspecified: Secondary | ICD-10-CM | POA: Diagnosis present

## 2018-08-03 DIAGNOSIS — F329 Major depressive disorder, single episode, unspecified: Secondary | ICD-10-CM | POA: Diagnosis present

## 2018-08-03 DIAGNOSIS — K5669 Other partial intestinal obstruction: Secondary | ICD-10-CM | POA: Diagnosis present

## 2018-08-03 DIAGNOSIS — E860 Dehydration: Secondary | ICD-10-CM | POA: Diagnosis present

## 2018-08-03 LAB — GLUCOSE, CAPILLARY
Glucose-Capillary: 148 mg/dL — ABNORMAL HIGH (ref 70–99)
Glucose-Capillary: 140 mg/dL — ABNORMAL HIGH (ref 70–99)
Glucose-Capillary: 134 mg/dL — ABNORMAL HIGH (ref 70–99)
Glucose-Capillary: 123 mg/dL — ABNORMAL HIGH (ref 70–99)

## 2018-08-03 LAB — BASIC METABOLIC PANEL
Anion gap: 15 (ref 5–15)
BUN: 55 mg/dL — ABNORMAL HIGH (ref 8–23)
CO2: 31 mmol/L (ref 22–32)
Calcium: 8.8 mg/dL — ABNORMAL LOW (ref 8.9–10.3)
Chloride: 93 mmol/L — ABNORMAL LOW (ref 98–111)
Creatinine, Ser: 1.08 mg/dL — ABNORMAL HIGH (ref 0.44–1.00)
GFR calc Af Amer: >60 mL/min (ref >60)
GFR calc non Af Amer: 54 mL/min — ABNORMAL LOW (ref >60)
Glucose, Bld: 165 mg/dL — ABNORMAL HIGH (ref 70–99)
Potassium: 4 mmol/L (ref 3.5–5.1)
Sodium: 139 mmol/L (ref 135–145)

## 2018-08-03 LAB — MAGNESIUM: Magnesium: 1.8 mg/dL (ref 1.7–2.4)

## 2018-08-03 MED ORDER — PROMETHAZINE HCL 25 MG/ML IJ SOLN
12.5000 mg | Freq: Four times a day (QID) | INTRAMUSCULAR | Status: DC | PRN
  Administered 2018-08-03 – 2018-08-04 (×4): 12.5 mg via INTRAVENOUS
  Filled 2018-08-03 (×4): qty 1

## 2018-08-03 NOTE — Consult Note (Signed)
Reason for Consult: Small bowel obstruction Referring Physician: Dr. Ander Slade COTY STUDENT is an 68 y.o. female.  HPI: Patient is a 68 year old white female with a complex history of multiple abdominal surgeries who presents with a 3-day history of worsening abdominal distention, nausea, and vomiting.  Patient last had an episode of a partial bowel obstruction in May 2019.  I did see her at that time.  This did resolve without NG tube decompression or surgery.  At that time, it was felt that should she have more frequent episodes of obstruction, she would need to be evaluated and treated by [facility] as they had done her previous abdominal surgeries.  Patient was admitted overnight and this morning, she started passing flatus.  She does still have some intermittent nausea.  She denies any abdominal pain.  No fever or chills have been noted.  Past Medical History:  Diagnosis Date  . Arthritis    "hands, knees, feet"  . Asthma   . Colon polyps   . COPD (chronic obstructive pulmonary disease) ([place])   . Hyperlipidemia   . Hypertension   . Peripheral edema   . Pneumonia 08/2010; 11/2010  . SBO (small bowel obstruction) ([place]) 08/25/11   recurrent  . Type II diabetes mellitus ([place])     Past Surgical History:  Procedure Laterality Date  . APPENDECTOMY    . CHOLECYSTECTOMY  1979  . COLONOSCOPY  2008   Dr. Oneida Alar: 29mm sessile cecal polyp and 71mm sessile transverse colon polyp, torturous sigmoid colon, fragments of tubular adenomas on path   . COLONOSCOPY WITH PROPOFOL N/A 08/13/2015   Procedure: COLONOSCOPY WITH PROPOFOL;  Surgeon: Danie Binder, MD;  [facility];  Service: Endoscopy;  Laterality: N/A;  0814  . ESOPHAGEAL DILATION     remote past by Dr. Tamala Julian  . HERNIA REPAIR    . KNEE CARTILAGE SURGERY  07/2003   right/E-chart  . LAPAROSCOPIC INCISIONAL / UMBILICAL / VENTRAL HERNIA REPAIR  ? til 2002   "~ 13 times"  . POLYPECTOMY  08/13/2015   Procedure:  POLYPECTOMY;  Surgeon: Danie Binder, MD;  Location: [facility];  Service: Endoscopy;;  ascending colon polyp, hepatic flexure polyp, transverse colon polyp, descending colon polyp, sigmoid colon polyp, rectal polyps times 3  . SMALL INTESTINE SURGERY     Feb 2015 at [facility] and then remote past   . [place]    Family History  Problem Relation Age of Onset  . Colon cancer Mother 35    Social History:  reports that she has been smoking cigarettes. She has a 42.00 pack-year smoking history. She quit smokeless tobacco use about 3 years ago. She reports that she does not drink alcohol or use drugs.  Allergies:  Allergies  Allergen Reactions  . Hydrocodone Itching  . Penicillins Hives and Itching    Has patient had a PCN reaction causing immediate rash, facial/tongue/throat swelling, SOB or lightheadedness with hypotension: Yes Has patient had a PCN reaction causing severe rash involving mucus membranes or skin necrosis: No Has patient had a PCN reaction that required hospitalization No Has patient had a PCN reaction occurring within the last 10 years: No If all of the above answers are "NO", then may proceed with Cephalosporin use.  Has patient had a PCN reaction causing immediate rash, facial/tongue/throat swelling, SOB or lightheadedness with hypotension: Yes Has patient had a PCN reaction causing severe rash involving mucus membranes or skin necrosis:  No Has patient had a PCN reaction that required hospitalization No Has patient had a PCN reaction occurring within the last 10 years: No If all of the above answers are "NO", then may proceed with Cephalosporin use. Can tolerate cephalosporins, per pt Electronically signed by: Rudene Christians, [facility] 06/07/2013 10:43 AM    Medications: I have reviewed the patient's current medications.  Results for orders placed or performed during the hospital encounter of 08/02/18 (from the past 48 hour(s))  Urinalysis, Routine w reflex  microscopic     Status: Abnormal   Collection Time: 08/02/18  1:15 PM  Result Value Ref Range   Color, Urine YELLOW YELLOW   APPearance CLEAR CLEAR   Specific Gravity, Urine 1.041 (H) 1.005 - 1.030   pH 5.0 5.0 - 8.0   Glucose, UA NEGATIVE NEGATIVE mg/dL   Hgb urine dipstick NEGATIVE NEGATIVE   Bilirubin Urine NEGATIVE NEGATIVE   Ketones, ur 20 (A) NEGATIVE mg/dL   Protein, ur NEGATIVE NEGATIVE mg/dL   Nitrite NEGATIVE NEGATIVE   Leukocytes,Ua NEGATIVE NEGATIVE    Comment: Performed at [facility], [address], [place], [place] [address]  Lipase, blood     Status: None   Collection Time: 08/02/18  2:24 PM  Result Value Ref Range   Lipase 19 11 - 51 U/L    Comment: Performed at [facility], [address] [place], [place] [address]  Comprehensive metabolic panel     Status: Abnormal   Collection Time: 08/02/18  2:24 PM  Result Value Ref Range   Sodium 134 (L) 135 - 145 mmol/L   Potassium 4.5 3.5 - 5.1 mmol/L   Chloride 87 (L) 98 - 111 mmol/L   CO2 32 22 - 32 mmol/L   Glucose, Bld 191 (H) 70 - 99 mg/dL   BUN 66 (H) 8 - 23 mg/dL   Creatinine, Ser 1.38 (H) 0.44 - 1.00 mg/dL   Calcium 9.1 8.9 - 10.3 mg/dL   Total Protein 7.5 6.5 - 8.1 g/dL   Albumin 3.9 3.5 - 5.0 g/dL   AST 12 (L) 15 - 41 U/L   ALT 17 0 - 44 U/L   Alkaline Phosphatase 60 38 - 126 U/L   Total Bilirubin 0.9 0.3 - 1.2 mg/dL   GFR calc non Af Amer 40 (L) >60 mL/min   GFR calc Af Amer 46 (L) >60 mL/min   Anion gap 15 5 - 15    Comment: Performed at [facility], [address], [place], [place] [address]  CBC     Status: None   Collection Time: 08/02/18  2:24 PM  Result Value Ref Range   WBC 9.0 4.0 - 10.5 K/uL   RBC 4.82 3.87 - 5.11 MIL/uL   Hemoglobin 14.9 12.0 - 15.0 g/dL   HCT 44.2 36.0 - 46.0 %   MCV 91.7 80.0 - 100.0 fL   MCH 30.9 26.0 - 34.0 pg   MCHC 33.7 30.0 - 36.0 g/dL   RDW 13.2 11.5 - 15.5 %   Platelets 284 150 - 400 K/uL   nRBC 0.0 0.0 - 0.2 %    Comment: Performed at [facility],  [address] [place], [place] [address]  Glucose, capillary     Status: Abnormal   Collection Time: 08/02/18  9:57 PM  Result Value Ref Range   Glucose-Capillary 181 (H) 70 - 99 mg/dL   Comment 1 Notify RN    Comment 2 Document in Chart   Basic metabolic panel  Status: Abnormal   Collection Time: 08/03/18  4:55 AM  Result Value Ref Range   Sodium 139 135 - 145 mmol/L   Potassium 4.0 3.5 - 5.1 mmol/L   Chloride 93 (L) 98 - 111 mmol/L   CO2 31 22 - 32 mmol/L   Glucose, Bld 165 (H) 70 - 99 mg/dL   BUN 55 (H) 8 - 23 mg/dL   Creatinine, Ser 1.08 (H) 0.44 - 1.00 mg/dL   Calcium 8.8 (L) 8.9 - 10.3 mg/dL   GFR calc non Af Amer 54 (L) >60 mL/min   GFR calc Af Amer >60 >60 mL/min   Anion gap 15 5 - 15    Comment: Performed at [facility], [address] [place], [place] [address]  Magnesium     Status: None   Collection Time: 08/03/18  4:55 AM  Result Value Ref Range   Magnesium 1.8 1.7 - 2.4 mg/dL    Comment: Performed at [facility], [address] [place], [place] [address]  Glucose, capillary     Status: Abnormal   Collection Time: 08/03/18  7:38 AM  Result Value Ref Range   Glucose-Capillary 148 (H) 70 - 99 mg/dL    Ct Abdomen Pelvis W Contrast  Result Date: 08/02/2018 CLINICAL DATA:  Abdominal pain and vomiting for 4 days. EXAM: CT ABDOMEN AND PELVIS WITH CONTRAST TECHNIQUE: Multidetector CT imaging of the abdomen and pelvis was performed using the standard protocol following bolus administration of intravenous contrast. CONTRAST:  6mL OMNIPAQUE IOHEXOL 300 MG/ML  SOLN COMPARISON:  08/21/2017.  PET-CT 01/07/2018. FINDINGS: Lower chest: Unremarkable Hepatobiliary: 12 mm low-density lesion inferior right liver is stable since 08/21/2017. Mild prominence of the intrahepatic bile ducts is stable. Gallbladder surgically absent. Common bile duct measures 9 mm diameter, similar to prior study. Pancreas: No focal mass lesion. No dilatation of the main duct. No intraparenchymal cyst. No  peripancreatic edema. Spleen: No splenomegaly. No focal mass lesion. Adrenals/Urinary Tract: No adrenal nodule or mass. Kidneys unremarkable. No evidence for hydroureter. The urinary bladder appears normal for the degree of distention. Stomach/Bowel: Stomach is unremarkable. No gastric wall thickening. No evidence of outlet obstruction. Duodenum is normally positioned as is the ligament of Treitz. Jejunal loops are dilated and fluid-filled measuring up to 5.8 cm diameter. In the mid to distal small bowel, and abrupt transition zone is identified (axial 67/series 2 and coronal image 41/series 7). Small bowel then becomes dilated again, in the region of a small bowel anastomosis. Approximately 20 cm distal to the first transition zone a second abrupt transition is identified (axial 61/2 and coronal 46/7). There are some adhesions in the region of both transition zones with evidence of tethering to the sigmoid colon. Small bowel distal to the second transition point is completely decompressed into the ileocecal valve. Colon is largely decompressed throughout. Vascular/Lymphatic: There is abdominal aortic atherosclerosis without aneurysm. There is no gastrohepatic or hepatoduodenal ligament lymphadenopathy. No intraperitoneal or retroperitoneal lymphadenopathy. No pelvic sidewall lymphadenopathy. Reproductive: The uterus is unremarkable.  There is no adnexal mass. Other: No intraperitoneal free fluid. Musculoskeletal: Nodularity in the anterior abdominal wall is similar to prior and may be from prior sites of injection. No worrisome lytic or sclerotic osseous abnormality. Mild compression deformity at T11 is stable. IMPRESSION: 1. Mechanical small bowel obstruction in the mid to distal small bowel. 2 discrete transition zones are identified with about 20 cm intervening dilated fluid-filled small bowel. Multiple adhesions are evident in the region of obstruction and obstructive etiology may  be related to the adhesions  although closed loop obstruction and internal hernia with obstruction can not be excluded. No appreciable small bowel wall thickening or pneumatosis. No substantial intraperitoneal free fluid. 2. Stable 12 mm low-density lesion inferior right liver, indeterminate. This showed no hypermetabolism on recent PET-CT. 3.  Aortic Atherosclerois (ICD10-170.0) Electronically Signed   By: Misty Stanley M.D.   On: 08/02/2018 17:29    ROS:  Pertinent items are noted in HPI.  Blood pressure (!) 148/59, pulse 96, temperature (!) 97.5 F (36.4 C), temperature source Oral, resp. rate 16, height 5' 4.5" (1.638 m), weight 84.6 kg, SpO2 95 %. Physical Exam: Well-developed well-nourished white female no acute distress Head is normocephalic, atraumatic Lungs clear to auscultation with good breath sounds bilaterally Heart examination reveals a regular rate and rhythm without S3, S4, murmurs Abdomen is soft with multiple surgical scars present.  No rigidity is noted.  No tenderness is noted.  Bowel sounds are present.  CT scan images personally reviewed  Assessment/Plan: Impression: Partial small bowel obstruction, clinically starting to resolve.  Does have adhesive disease and her surgical intervention would be very complex.  Should this be required, referral to a tertiary care center in would be indicated.  In the meantime, we can advance her to clear liquid diet.  She has refused an NG tube.  Continue IV fluids due to her azotemia.  Will follow with you.  Aviva Signs 08/03/2018, 10:16 AM

## 2018-08-03 NOTE — Care Management Important Message (Signed)
Important Message  Patient Details  Name: [name] MRN: [id number] Date of Birth: October 01, 1950   Medicare Important Message Given:  Yes    [name] 08/03/2018, 3:32 PM

## 2018-08-03 NOTE — Progress Notes (Signed)
PROGRESS NOTE    [name]  JYN:[id number] DOB: [birth year] DOA: 08/02/2018 PCP: Redmond School, MD    Brief Narrative:  68 year old female with a history of COPD on home oxygen, hypertension, diabetes, presents with abdominal pain, vomiting.  Found to have small bowel obstruction.  History of multiple abdominal surgeries in the past.  Being treated conservatively.  General surgery following.   Assessment & Plan:   Active Problems:   Type 2 diabetes mellitus without complication (HCC)   SBO (small bowel obstruction) (HCC)   HTN (hypertension)   COPD (chronic obstructive pulmonary disease) (HCC)   Small bowel obstruction (HCC)   1. Small bowel obstructions.  General surgery following.  She had multiple abdominal surgeries in the past.  She was started on clear liquids earlier today, but does not appear to be tolerating this.  She is continued vomiting.  Patient was agreeable to NG tube placement, but once this was placed, she asked that this be removed.  Continue n.p.o. and bowel rest. 2. Mild acute kidney injury.  Related to dehydration.  Continue on IV fluids. 3. COPD with chronic respiratory failure.  Currently on home oxygen requirement.  Continue bronchodilators as needed. 4. Diabetes.  Continue on sliding scale insulin.  Oral medications including metformin, glipizide and Januvia currently on hold. 5. Hypertension.  Chronically on enalapril.  Blood pressures currently stable.  DVT prophylaxis: SCDs Code Status: Full code Family Communication: Discussed with patient Disposition Plan: Discharge home once bowel issues have improved   Consultants:   General surgery  Procedures:    Antimicrobials:       Subjective: Started on clear liquids earlier today.  She reports having persistent vomiting since then.  She is not had any bowel movements.  She does describe passing gas.  Objective: Vitals:   08/02/18 2128 08/03/18 0534 08/03/18 1318 08/03/18 1920  BP: (!)  153/61 (!) 148/59 (!) 148/66   Pulse: 96 96 87   Resp: (!) 21 16 16    Temp: 98.4 F (36.9 C) (!) 97.5 F (36.4 C) (!) 97.4 F (36.3 C)   TempSrc: Oral Oral Oral   SpO2: 93% 95% 95% 92%  Weight: 84.6 kg     Height: 5' 4.5" (1.638 m)       Intake/Output Summary (Last 24 hours) at 08/03/2018 1925 Last data filed at 08/03/2018 1316 Gross per 24 hour  Intake 652.68 ml  Output 2000 ml  Net -1347.32 ml   Filed Weights   08/02/18 1312 08/02/18 2128  Weight: 81.2 kg 84.6 kg    Examination:  General exam: Appears calm and comfortable  Respiratory system: Clear to auscultation. Respiratory effort normal. Cardiovascular system: S1 & S2 heard, RRR. No JVD, murmurs, rubs, gallops or clicks. No pedal edema. Gastrointestinal system: Abdomen is distended, soft and nontender. No organomegaly or masses felt bowel sounds are very sluggish Central nervous system: Alert and oriented. No focal neurological deficits. Extremities: Symmetric 5 x 5 power. Skin: No rashes, lesions or ulcers Psychiatry: Judgement and insight appear normal. Mood & affect appropriate.     Data Reviewed: I have personally reviewed following labs and imaging studies  CBC: Recent Labs  Lab 08/02/18 1424  WBC 9.0  HGB 14.9  HCT 44.2  MCV 91.7  PLT 865   Basic Metabolic Panel: Recent Labs  Lab 08/02/18 1424 08/03/18 0455  NA 134* 139  K 4.5 4.0  CL 87* 93*  CO2 32 31  GLUCOSE 191* 165*  BUN 66* 55*  CREATININE 1.38*  1.08*  CALCIUM 9.1 8.8*  MG  --  1.8   GFR: Estimated Creatinine Clearance: 55.3 mL/min (A) (by C-G formula based on SCr of 1.08 mg/dL (H)). Liver Function Tests: Recent Labs  Lab 08/02/18 1424  AST 12*  ALT 17  ALKPHOS 60  BILITOT 0.9  PROT 7.5  ALBUMIN 3.9   Recent Labs  Lab 08/02/18 1424  LIPASE 19   No results for input(s): AMMONIA in the last 168 hours. Coagulation Profile: No results for input(s): INR, PROTIME in the last 168 hours. Cardiac Enzymes: No results for  input(s): CKTOTAL, CKMB, CKMBINDEX, TROPONINI in the last 168 hours. BNP (last 3 results) No results for input(s): PROBNP in the last 8760 hours. HbA1C: No results for input(s): HGBA1C in the last 72 hours. CBG: Recent Labs  Lab 08/02/18 2157 08/03/18 0738 08/03/18 1128 08/03/18 1616  GLUCAP 181* 148* 140* 134*   Lipid Profile: No results for input(s): CHOL, HDL, LDLCALC, TRIG, CHOLHDL, LDLDIRECT in the last 72 hours. Thyroid Function Tests: No results for input(s): TSH, T4TOTAL, FREET4, T3FREE, THYROIDAB in the last 72 hours. Anemia Panel: No results for input(s): VITAMINB12, FOLATE, FERRITIN, TIBC, IRON, RETICCTPCT in the last 72 hours. Sepsis Labs: No results for input(s): PROCALCITON, LATICACIDVEN in the last 168 hours.  No results found for this or any previous visit (from the past 240 hour(s)).       Radiology Studies: Ct Abdomen Pelvis W Contrast  Result Date: 08/02/2018 CLINICAL DATA:  Abdominal pain and vomiting for 4 days. EXAM: CT ABDOMEN AND PELVIS WITH CONTRAST TECHNIQUE: Multidetector CT imaging of the abdomen and pelvis was performed using the standard protocol following bolus administration of intravenous contrast. CONTRAST:  40mL OMNIPAQUE IOHEXOL 300 MG/ML  SOLN COMPARISON:  08/21/2017.  PET-CT 01/07/2018. FINDINGS: Lower chest: Unremarkable Hepatobiliary: 12 mm low-density lesion inferior right liver is stable since 08/21/2017. Mild prominence of the intrahepatic bile ducts is stable. Gallbladder surgically absent. Common bile duct measures 9 mm diameter, similar to prior study. Pancreas: No focal mass lesion. No dilatation of the main duct. No intraparenchymal cyst. No peripancreatic edema. Spleen: No splenomegaly. No focal mass lesion. Adrenals/Urinary Tract: No adrenal nodule or mass. Kidneys unremarkable. No evidence for hydroureter. The urinary bladder appears normal for the degree of distention. Stomach/Bowel: Stomach is unremarkable. No gastric wall  thickening. No evidence of outlet obstruction. Duodenum is normally positioned as is the ligament of Treitz. Jejunal loops are dilated and fluid-filled measuring up to 5.8 cm diameter. In the mid to distal small bowel, and abrupt transition zone is identified (axial 67/series 2 and coronal image 41/series 7). Small bowel then becomes dilated again, in the region of a small bowel anastomosis. Approximately 20 cm distal to the first transition zone a second abrupt transition is identified (axial 61/2 and coronal 46/7). There are some adhesions in the region of both transition zones with evidence of tethering to the sigmoid colon. Small bowel distal to the second transition point is completely decompressed into the ileocecal valve. Colon is largely decompressed throughout. Vascular/Lymphatic: There is abdominal aortic atherosclerosis without aneurysm. There is no gastrohepatic or hepatoduodenal ligament lymphadenopathy. No intraperitoneal or retroperitoneal lymphadenopathy. No pelvic sidewall lymphadenopathy. Reproductive: The uterus is unremarkable.  There is no adnexal mass. Other: No intraperitoneal free fluid. Musculoskeletal: Nodularity in the anterior abdominal wall is similar to prior and may be from prior sites of injection. No worrisome lytic or sclerotic osseous abnormality. Mild compression deformity at T11 is stable. IMPRESSION: 1. Mechanical small bowel obstruction  in the mid to distal small bowel. 2 discrete transition zones are identified with about 20 cm intervening dilated fluid-filled small bowel. Multiple adhesions are evident in the region of obstruction and obstructive etiology may be related to the adhesions although closed loop obstruction and internal hernia with obstruction can not be excluded. No appreciable small bowel wall thickening or pneumatosis. No substantial intraperitoneal free fluid. 2. Stable 12 mm low-density lesion inferior right liver, indeterminate. This showed no  hypermetabolism on recent PET-CT. 3.  Aortic Atherosclerois (ICD10-170.0) Electronically Signed   By: Misty Stanley M.D.   On: 08/02/2018 17:29        Scheduled Meds: . insulin aspart  0-9 Units Subcutaneous TID WC  . mouth rinse  15 mL Mouth Rinse BID  . mometasone-formoterol  2 puff Inhalation BID   Continuous Infusions: . lactated ringers 100 mL/hr at 08/03/18 0817     LOS: 0 days    Time spent: 81mins    Kathie Dike, MD [facility]   If 7PM-7AM, please contact night-coverage [email or web address]  08/03/2018, 7:25 PM

## 2018-08-03 NOTE — Progress Notes (Signed)
Two attempts at NG tube, size 16 and then size 12 in each nare. 12 went in left nare and advanced but patient insisted on removing.

## 2018-08-03 NOTE — Progress Notes (Signed)
Continues to complain of nausea. Explain importance of ng tube placement for disease process, patient still declines at this point.

## 2018-08-04 LAB — CBC
HCT: 41.4 % (ref 36.0–46.0)
Hemoglobin: 13.2 g/dL (ref 12.0–15.0)
MCH: 30.2 pg (ref 26.0–34.0)
MCHC: 31.9 g/dL (ref 30.0–36.0)
MCV: 94.7 fL (ref 80.0–100.0)
Platelets: 259 10*3/uL (ref 150–400)
RBC: 4.37 MIL/uL (ref 3.87–5.11)
RDW: 13.1 % (ref 11.5–15.5)
WBC: 10.2 10*3/uL (ref 4.0–10.5)
nRBC: 0 % (ref 0.0–0.2)

## 2018-08-04 LAB — GLUCOSE, CAPILLARY
Glucose-Capillary: 116 mg/dL — ABNORMAL HIGH (ref 70–99)
Glucose-Capillary: 127 mg/dL — ABNORMAL HIGH (ref 70–99)
Glucose-Capillary: 141 mg/dL — ABNORMAL HIGH (ref 70–99)
Glucose-Capillary: 94 mg/dL (ref 70–99)

## 2018-08-04 LAB — BASIC METABOLIC PANEL
Anion gap: 12 (ref 5–15)
BUN: 34 mg/dL — ABNORMAL HIGH (ref 8–23)
CO2: 32 mmol/L (ref 22–32)
Calcium: 8.9 mg/dL (ref 8.9–10.3)
Chloride: 95 mmol/L — ABNORMAL LOW (ref 98–111)
Creatinine, Ser: 0.88 mg/dL (ref 0.44–1.00)
GFR calc Af Amer: >60 mL/min (ref >60)
GFR calc non Af Amer: >60 mL/min (ref >60)
Glucose, Bld: 116 mg/dL — ABNORMAL HIGH (ref 70–99)
Potassium: 3.5 mmol/L (ref 3.5–5.1)
Sodium: 139 mmol/L (ref 135–145)

## 2018-08-04 MED ORDER — POLYETHYLENE GLYCOL 3350 17 G PO PACK
17.0000 g | PACK | Freq: Two times a day (BID) | ORAL | Status: DC
  Administered 2018-08-04 – 2018-08-06 (×5): 17 g via ORAL
  Filled 2018-08-04 (×5): qty 1

## 2018-08-04 NOTE — Progress Notes (Signed)
PROGRESS NOTE    [name]  DPO:[id number] DOB: 04-09-1951 DOA: 08/02/2018 PCP: Redmond School, MD    Brief Narrative:  68 year old female with a history of COPD on home oxygen, hypertension, diabetes, presents with abdominal pain, vomiting.  Found to have small bowel obstruction.  History of multiple abdominal surgeries in the past.  Being treated conservatively.  General surgery following.   Assessment & Plan:   Active Problems:   Type 2 diabetes mellitus without complication (HCC)   SBO (small bowel obstruction) (HCC)   HTN (hypertension)   COPD (chronic obstructive pulmonary disease) (HCC)   Small bowel obstruction (HCC)   Chronic respiratory failure with hypoxia (HCC)   AKI (acute kidney injury) ([place])   1. Small bowel obstructions.  General surgery following.  She had multiple abdominal surgeries in the past.  She did not tolerate NG tube placement.  She is currently on sips of clear liquids.  She is not had a bowel movement as of yet, but is gas.  Continue supportive measures. 2. Mild acute kidney injury.  Related to dehydration.  Improving.  Continue on IV fluids. 3. COPD with chronic respiratory failure.  Currently on home oxygen requirement.  Continue bronchodilators as needed. 4. Diabetes.  Continue on sliding scale insulin.  Oral medications including metformin, glipizide and Januvia currently on hold.  Blood sugars have been stable 5. Hypertension.  Chronically on enalapril.  Blood pressures currently stable.  DVT prophylaxis: SCDs Code Status: Full code Family Communication: Discussed with patient Disposition Plan: Discharge home once bowel issues have improved   Consultants:   General surgery  Procedures:    Antimicrobials:       Subjective: Patient did not tolerate placement of NG tube yesterday.  She is passing gas, but has not had a bowel movement.  Continues to feel very nauseous.  Objective: Vitals:   08/04/18 0512 08/04/18 0734 08/04/18  1349 08/04/18 1935  BP: (!) 144/59  (!) 158/70   Pulse: 92  99   Resp: 16  18   Temp: 97.7 F (36.5 C)  97.9 F (36.6 C)   TempSrc: Oral  Oral   SpO2: 95% 95% 96% 95%  Weight:      Height:        Intake/Output Summary (Last 24 hours) at 08/04/2018 1942 Last data filed at 08/04/2018 1835 Gross per 24 hour  Intake 4092.43 ml  Output 950 ml  Net 3142.43 ml   Filed Weights   08/02/18 1312 08/02/18 2128  Weight: 81.2 kg 84.6 kg    Examination:  General exam: Appears calm and comfortable  Respiratory system: Clear to auscultation. Respiratory effort normal. Cardiovascular system: S1 & S2 heard, RRR. No JVD, murmurs, rubs, gallops or clicks. No pedal edema. Gastrointestinal system: Abdomen is distended, soft and tender in lower abdomen. No organomegaly or masses felt Bowel sounds are sluggish Central nervous system: Alert and oriented. No focal neurological deficits. Extremities: Symmetric 5 x 5 power. Skin: No rashes, lesions or ulcers Psychiatry: Judgement and insight appear normal. Mood & affect appropriate.     Data Reviewed: I have personally reviewed following labs and imaging studies  CBC: Recent Labs  Lab 08/02/18 1424 08/04/18 0514  WBC 9.0 10.2  HGB 14.9 13.2  HCT 44.2 41.4  MCV 91.7 94.7  PLT 284 431   Basic Metabolic Panel: Recent Labs  Lab 08/02/18 1424 08/03/18 0455 08/04/18 0514  NA 134* 139 139  K 4.5 4.0 3.5  CL 87* 93* 95*  CO2  32 31 32  GLUCOSE 191* 165* 116*  BUN 66* 55* 34*  CREATININE 1.38* 1.08* 0.88  CALCIUM 9.1 8.8* 8.9  MG  --  1.8  --    GFR: Estimated Creatinine Clearance: 67.8 mL/min (by C-G formula based on SCr of 0.88 mg/dL). Liver Function Tests: Recent Labs  Lab 08/02/18 1424  AST 12*  ALT 17  ALKPHOS 60  BILITOT 0.9  PROT 7.5  ALBUMIN 3.9   Recent Labs  Lab 08/02/18 1424  LIPASE 19   No results for input(s): AMMONIA in the last 168 hours. Coagulation Profile: No results for input(s): INR, PROTIME in the  last 168 hours. Cardiac Enzymes: No results for input(s): CKTOTAL, CKMB, CKMBINDEX, TROPONINI in the last 168 hours. BNP (last 3 results) No results for input(s): PROBNP in the last 8760 hours. HbA1C: No results for input(s): HGBA1C in the last 72 hours. CBG: Recent Labs  Lab 08/03/18 1616 08/03/18 2152 08/04/18 0742 08/04/18 1106 08/04/18 1630  GLUCAP 134* 123* 116* 127* 141*   Lipid Profile: No results for input(s): CHOL, HDL, LDLCALC, TRIG, CHOLHDL, LDLDIRECT in the last 72 hours. Thyroid Function Tests: No results for input(s): TSH, T4TOTAL, FREET4, T3FREE, THYROIDAB in the last 72 hours. Anemia Panel: No results for input(s): VITAMINB12, FOLATE, FERRITIN, TIBC, IRON, RETICCTPCT in the last 72 hours. Sepsis Labs: No results for input(s): PROCALCITON, LATICACIDVEN in the last 168 hours.  No results found for this or any previous visit (from the past 240 hour(s)).       Radiology Studies: No results found.      Scheduled Meds: . insulin aspart  0-9 Units Subcutaneous TID WC  . mouth rinse  15 mL Mouth Rinse BID  . mometasone-formoterol  2 puff Inhalation BID  . polyethylene glycol  17 g Oral BID   Continuous Infusions: . lactated ringers 100 mL/hr at 08/04/18 1755     LOS: 1 day    Time spent: 11mins    Kathie Dike, MD [facility]   If 7PM-7AM, please contact night-coverage [email or web address]  08/04/2018, 7:42 PM

## 2018-08-04 NOTE — Progress Notes (Signed)
Subjective: Patient had episode of abdominal pain and nausea yesterday.  NG tube could not be placed.  She states she is continued to have flatus.  She wants to go home, but she is still n.p.o. at this time.  Objective: Vital signs in last 24 hours: Temp:  [97.4 F (36.3 C)-98.9 F (37.2 C)] 97.7 F (36.5 C) (04/16 0512) Pulse Rate:  [87-99] 92 (04/16 0512) Resp:  [16] 16 (04/16 0512) BP: (144-148)/(56-66) 144/59 (04/16 0512) SpO2:  [91 %-95 %] 95 % (04/16 0734) Last BM Date: 07/30/18  Intake/Output from previous day: 04/15 0701 - 04/16 0700 In: 2092.8 [I.V.:2092.8] Out: 1550 [Urine:1050; Emesis/NG output:500] Intake/Output this shift: No intake/output data recorded.  General appearance: alert, cooperative and no distress GI: soft, non-tender; bowel sounds normal; no masses,  no organomegaly  Lab Results:  Recent Labs    08/02/18 1424 08/04/18 0514  WBC 9.0 10.2  HGB 14.9 13.2  HCT 44.2 41.4  PLT 284 259   BMET Recent Labs    08/03/18 0455 08/04/18 0514  NA 139 139  K 4.0 3.5  CL 93* 95*  CO2 31 32  GLUCOSE 165* 116*  BUN 55* 34*  CREATININE 1.08* 0.88  CALCIUM 8.8* 8.9   PT/INR No results for input(s): LABPROT, INR in the last 72 hours.  Studies/Results: Ct Abdomen Pelvis W Contrast  Result Date: 08/02/2018 CLINICAL DATA:  Abdominal pain and vomiting for 4 days. EXAM: CT ABDOMEN AND PELVIS WITH CONTRAST TECHNIQUE: Multidetector CT imaging of the abdomen and pelvis was performed using the standard protocol following bolus administration of intravenous contrast. CONTRAST:  70mL OMNIPAQUE IOHEXOL 300 MG/ML  SOLN COMPARISON:  08/21/2017.  PET-CT 01/07/2018. FINDINGS: Lower chest: Unremarkable Hepatobiliary: 12 mm low-density lesion inferior right liver is stable since 08/21/2017. Mild prominence of the intrahepatic bile ducts is stable. Gallbladder surgically absent. Common bile duct measures 9 mm diameter, similar to prior study. Pancreas: No focal mass  lesion. No dilatation of the main duct. No intraparenchymal cyst. No peripancreatic edema. Spleen: No splenomegaly. No focal mass lesion. Adrenals/Urinary Tract: No adrenal nodule or mass. Kidneys unremarkable. No evidence for hydroureter. The urinary bladder appears normal for the degree of distention. Stomach/Bowel: Stomach is unremarkable. No gastric wall thickening. No evidence of outlet obstruction. Duodenum is normally positioned as is the ligament of Treitz. Jejunal loops are dilated and fluid-filled measuring up to 5.8 cm diameter. In the mid to distal small bowel, and abrupt transition zone is identified (axial 67/series 2 and coronal image 41/series 7). Small bowel then becomes dilated again, in the region of a small bowel anastomosis. Approximately 20 cm distal to the first transition zone a second abrupt transition is identified (axial 61/2 and coronal 46/7). There are some adhesions in the region of both transition zones with evidence of tethering to the sigmoid colon. Small bowel distal to the second transition point is completely decompressed into the ileocecal valve. Colon is largely decompressed throughout. Vascular/Lymphatic: There is abdominal aortic atherosclerosis without aneurysm. There is no gastrohepatic or hepatoduodenal ligament lymphadenopathy. No intraperitoneal or retroperitoneal lymphadenopathy. No pelvic sidewall lymphadenopathy. Reproductive: The uterus is unremarkable.  There is no adnexal mass. Other: No intraperitoneal free fluid. Musculoskeletal: Nodularity in the anterior abdominal wall is similar to prior and may be from prior sites of injection. No worrisome lytic or sclerotic osseous abnormality. Mild compression deformity at T11 is stable. IMPRESSION: 1. Mechanical small bowel obstruction in the mid to distal small bowel. 2 discrete transition zones are identified with about  20 cm intervening dilated fluid-filled small bowel. Multiple adhesions are evident in the region of  obstruction and obstructive etiology may be related to the adhesions although closed loop obstruction and internal hernia with obstruction can not be excluded. No appreciable small bowel wall thickening or pneumatosis. No substantial intraperitoneal free fluid. 2. Stable 12 mm low-density lesion inferior right liver, indeterminate. This showed no hypermetabolism on recent PET-CT. 3.  Aortic Atherosclerois (ICD10-170.0) Electronically Signed   By: Misty Stanley M.D.   On: 08/02/2018 17:29    Anti-infectives: Anti-infectives (From admission, onward)   None      Assessment/Plan: Impression: Partial small bowel obstruction. Plan: I told the patient that I was uncomfortable with her going home.  I did tell her that we would start clear liquid diet today.  We will also start MiraLAX, as patient states that she takes that at home.  Given difficulties in placing NG tube, would not try to place NG tube.  Hopefully this will resolve with conservative measures.  LOS: 1 day    Aviva Signs 08/04/2018

## 2018-08-04 NOTE — Progress Notes (Signed)
Patient reported one episode of emesis this afternoon, on arrival to room, noted to have 400 ml brown emesis. Had received phenergan prior to vomiting episode since still nauseated and not yet time for zofran. States "phenergan didn't work at all." abdomen soft, bowel sounds present. Reports passing flatus, no stool yet. zofran given as ordered PRN when next available. Notified Dr. Roderic Palau. On follow-up, patient reports relief of nausea. Able to tolerate sips of clears and chicken broth this evening. Denies pain or nausea at this time.

## 2018-08-05 LAB — GLUCOSE, CAPILLARY
Glucose-Capillary: 106 mg/dL — ABNORMAL HIGH (ref 70–99)
Glucose-Capillary: 140 mg/dL — ABNORMAL HIGH (ref 70–99)
Glucose-Capillary: 105 mg/dL — ABNORMAL HIGH (ref 70–99)
Glucose-Capillary: 155 mg/dL — ABNORMAL HIGH (ref 70–99)

## 2018-08-05 MED ORDER — BISACODYL 10 MG RE SUPP
10.0000 mg | Freq: Two times a day (BID) | RECTAL | Status: DC
  Administered 2018-08-05 – 2018-08-06 (×2): 10 mg via RECTAL
  Filled 2018-08-05 (×2): qty 1

## 2018-08-05 MED ORDER — BISACODYL 10 MG RE SUPP
10.0000 mg | Freq: Once | RECTAL | Status: AC
  Administered 2018-08-05: 12:00:00 10 mg via RECTAL
  Filled 2018-08-05: qty 1

## 2018-08-05 NOTE — Plan of Care (Signed)

## 2018-08-05 NOTE — Progress Notes (Signed)
  Subjective: Patient had no episode of emesis yesterday afternoon, but was placed back on clear liquids and is passing gas this morning.  She denies any significant abdominal pain.  Objective: Vital signs in last 24 hours: Temp:  [97.9 F (36.6 C)-98.9 F (37.2 C)] 98.7 F (37.1 C) (04/17 0557) Pulse Rate:  [80-99] 80 (04/17 0557) Resp:  [16-20] 20 (04/17 0557) BP: (137-158)/(62-70) 158/64 (04/17 0557) SpO2:  [94 %-96 %] 95 % (04/17 0733) Last BM Date: 07/30/18  Intake/Output from previous day: 04/16 0701 - 04/17 0700 In: 1999.7 [P.O.:480; I.V.:1519.7] Out: 800 [Urine:300; Emesis/NG output:500] Intake/Output this shift: No intake/output data recorded.  General appearance: alert, cooperative and no distress GI: soft, non-tender; bowel sounds normal; no masses,  no organomegaly  Lab Results:  Recent Labs    08/02/18 1424 08/04/18 0514  WBC 9.0 10.2  HGB 14.9 13.2  HCT 44.2 41.4  PLT 284 259   BMET Recent Labs    08/03/18 0455 08/04/18 0514  NA 139 139  K 4.0 3.5  CL 93* 95*  CO2 31 32  GLUCOSE 165* 116*  BUN 55* 34*  CREATININE 1.08* 0.88  CALCIUM 8.8* 8.9   PT/INR No results for input(s): LABPROT, INR in the last 72 hours.  Studies/Results: No results found.  Anti-infectives: Anti-infectives (From admission, onward)   None      Assessment/Plan: Impression: Intermittent partial small bowel obstruction. Plan: Patient has refused NG tube placement while in the hospital.  She is concerned about staying in the hospital due to the [place] pandemic.  She would like to be discharged home on clear liquids and advance herself as tolerated.  She does desire any surgical treatment to be done by Dr. Florene Glen at [facility] as he is operated on her in the past.  I suggested that she follow-up with him as needed.  Discussed with Dr. Roderic Palau.  LOS: 2 days    Aviva Signs 08/05/2018

## 2018-08-05 NOTE — Plan of Care (Signed)
  Problem: Education: Goal: Knowledge of General Education information will improve Description: Including pain rating scale, medication(s)/side effects and non-pharmacologic comfort measures Outcome: Progressing   Problem: Clinical Measurements: Goal: Ability to maintain clinical measurements within normal limits will improve Outcome: Progressing   Problem: Safety: Goal: Ability to remain free from injury will improve Outcome: Progressing   

## 2018-08-05 NOTE — Care Management Important Message (Signed)
Important Message  Patient Details  Name: [name] MRN: [id number] Date of Birth: [birth year]   Medicare Important Message Given:  Yes    [name] 08/05/2018, 3:58 PM

## 2018-08-05 NOTE — Progress Notes (Signed)
PROGRESS NOTE    [name]  NGE:[id number] DOB: 1950-11-12 DOA: 08/02/2018 PCP: Redmond School, MD    Brief Narrative:  68 year old female with a history of COPD on home oxygen, hypertension, diabetes, presents with abdominal pain, vomiting.  Found to have small bowel obstruction.  History of multiple abdominal surgeries in the past.  Being treated conservatively.  General surgery following.   Assessment & Plan:   Active Problems:   Type 2 diabetes mellitus without complication (HCC)   SBO (small bowel obstruction) (HCC)   HTN (hypertension)   COPD (chronic obstructive pulmonary disease) (HCC)   Small bowel obstruction (HCC)   Chronic respiratory failure with hypoxia (HCC)   AKI (acute kidney injury) ([place])   1. Small bowel obstructions.  General surgery following.  She had multiple abdominal surgeries in the past.  She did not tolerate NG tube placement.  She is currently on sips of clear liquids.  She is not had a bowel movement as of yet, but is passing gas.  She received 1 Dulcolax suppository with minimal bowel movement.  We will continue further suppositories.  Continue supportive measures. 2. Mild acute kidney injury.  Related to dehydration.  Improving.  Continue on IV fluids. 3. COPD with chronic respiratory failure.  Currently on home oxygen requirement.  Continue bronchodilators as needed. 4. Diabetes.  Continue on sliding scale insulin.  Oral medications including metformin, glipizide and Januvia currently on hold.  Blood sugars have been stable 5. Hypertension.  Chronically on enalapril.  Blood pressures currently stable.  DVT prophylaxis: SCDs Code Status: Full code Family Communication: Discussed with patient Disposition Plan: Discharge home once bowel issues have improved   Consultants:   General surgery  Procedures:    Antimicrobials:       Subjective: Patient is passing gas, no BM yet. Tolerating liquids and no vomiting.  Still has nausea   Objective: Vitals:   08/04/18 2137 08/05/18 0557 08/05/18 0733 08/05/18 1257  BP: 137/62 (!) 158/64  (!) 163/75  Pulse: 83 80  83  Resp: 16 20  18   Temp: 98.9 F (37.2 C) 98.7 F (37.1 C)  98.3 F (36.8 C)  TempSrc: Oral Oral  Oral  SpO2: 94% 94% 95% 94%  Weight:      Height:        Intake/Output Summary (Last 24 hours) at 08/05/2018 1947 Last data filed at 08/05/2018 0051 Gross per 24 hour  Intake -  Output 100 ml  Net -100 ml   Filed Weights   08/02/18 1312 08/02/18 2128  Weight: 81.2 kg 84.6 kg    Examination:  General exam: Alert, awake, oriented x 3 Respiratory system: Clear to auscultation. Respiratory effort normal. Cardiovascular system:RRR. No murmurs, rubs, gallops. Gastrointestinal system: Abdomen is nondistended, soft and nontender. No organomegaly or masses felt. Normal bowel sounds heard. Central nervous system: Alert and oriented. No focal neurological deficits. Extremities: No C/C/E, +pedal pulses Skin: No rashes, lesions or ulcers Psychiatry: Judgement and insight appear normal. Mood & affect appropriate.      Data Reviewed: I have personally reviewed following labs and imaging studies  CBC: Recent Labs  Lab 08/02/18 1424 08/04/18 0514  WBC 9.0 10.2  HGB 14.9 13.2  HCT 44.2 41.4  MCV 91.7 94.7  PLT 284 401   Basic Metabolic Panel: Recent Labs  Lab 08/02/18 1424 08/03/18 0455 08/04/18 0514  NA 134* 139 139  K 4.5 4.0 3.5  CL 87* 93* 95*  CO2 32 31 32  GLUCOSE 191*  165* 116*  BUN 66* 55* 34*  CREATININE 1.38* 1.08* 0.88  CALCIUM 9.1 8.8* 8.9  MG  --  1.8  --    GFR: Estimated Creatinine Clearance: 67.8 mL/min (by C-G formula based on SCr of 0.88 mg/dL). Liver Function Tests: Recent Labs  Lab 08/02/18 1424  AST 12*  ALT 17  ALKPHOS 60  BILITOT 0.9  PROT 7.5  ALBUMIN 3.9   Recent Labs  Lab 08/02/18 1424  LIPASE 19   No results for input(s): AMMONIA in the last 168 hours. Coagulation Profile: No results for  input(s): INR, PROTIME in the last 168 hours. Cardiac Enzymes: No results for input(s): CKTOTAL, CKMB, CKMBINDEX, TROPONINI in the last 168 hours. BNP (last 3 results) No results for input(s): PROBNP in the last 8760 hours. HbA1C: No results for input(s): HGBA1C in the last 72 hours. CBG: Recent Labs  Lab 08/04/18 1630 08/04/18 2109 08/05/18 0735 08/05/18 1203 08/05/18 1638  GLUCAP 141* 94 106* 140* 105*   Lipid Profile: No results for input(s): CHOL, HDL, LDLCALC, TRIG, CHOLHDL, LDLDIRECT in the last 72 hours. Thyroid Function Tests: No results for input(s): TSH, T4TOTAL, FREET4, T3FREE, THYROIDAB in the last 72 hours. Anemia Panel: No results for input(s): VITAMINB12, FOLATE, FERRITIN, TIBC, IRON, RETICCTPCT in the last 72 hours. Sepsis Labs: No results for input(s): PROCALCITON, LATICACIDVEN in the last 168 hours.  No results found for this or any previous visit (from the past 240 hour(s)).       Radiology Studies: No results found.      Scheduled Meds: . bisacodyl  10 mg Rectal BID  . insulin aspart  0-9 Units Subcutaneous TID WC  . mouth rinse  15 mL Mouth Rinse BID  . mometasone-formoterol  2 puff Inhalation BID  . polyethylene glycol  17 g Oral BID   Continuous Infusions: . lactated ringers 100 mL/hr at 08/05/18 0045     LOS: 2 days    Time spent: 59mins    Kathie Dike, MD [facility]   If 7PM-7AM, please contact night-coverage [email or web address]  08/05/2018, 7:47 PM

## 2018-08-06 LAB — GLUCOSE, CAPILLARY
Glucose-Capillary: 95 mg/dL (ref 70–99)
Glucose-Capillary: 95 mg/dL (ref 70–99)

## 2018-08-06 LAB — BASIC METABOLIC PANEL
Anion gap: 11 (ref 5–15)
BUN: 11 mg/dL (ref 8–23)
CO2: 28 mmol/L (ref 22–32)
Calcium: 8.3 mg/dL — ABNORMAL LOW (ref 8.9–10.3)
Chloride: 99 mmol/L (ref 98–111)
Creatinine, Ser: 0.68 mg/dL (ref 0.44–1.00)
GFR calc Af Amer: >60 mL/min (ref >60)
GFR calc non Af Amer: >60 mL/min (ref >60)
Glucose, Bld: 85 mg/dL (ref 70–99)
Potassium: 3.4 mmol/L — ABNORMAL LOW (ref 3.5–5.1)
Sodium: 138 mmol/L (ref 135–145)

## 2018-08-06 MED ORDER — BISACODYL 10 MG RE SUPP: 10.0000 mg | Freq: Every day | RECTAL | 0 refills | Status: AC | PRN

## 2018-08-06 MED ORDER — ONDANSETRON 8 MG PO TBDP: 8.0000 mg | ORAL_TABLET | Freq: Three times a day (TID) | ORAL | 0 refills | Status: AC | PRN

## 2018-08-06 NOTE — Discharge Summary (Signed)
Physician Discharge Summary  MATEYA TORTI BWL:[id number] DOB: 08/08/1950 DOA: 08/02/2018  PCP: Redmond School, MD  Admit date: 08/02/2018 Discharge date: 08/06/2018  Admitted From: Home Disposition: Home  Recommendations for Outpatient Follow-up:  1. Follow up with PCP in 1-2 weeks 2. Please obtain BMP/CBC in one week 3. Follow-up with general surgeon at [facility] as needed   Discharge Condition: Stable CODE STATUS: Full code Diet recommendation: Heart healthy, carb modified  Brief/Interim Summary: 68 year old female with a history of COPD on home oxygen, hypertension, diabetes, presents with abdominal pain, vomiting.  Found to have small bowel obstruction.  History of multiple abdominal surgeries in the past.  Being treated conservatively.  General surgery following.  Discharge Diagnoses:  Active Problems:   Type 2 diabetes mellitus without complication (HCC)   SBO (small bowel obstruction) (HCC)   HTN (hypertension)   COPD (chronic obstructive pulmonary disease) (HCC)   Small bowel obstruction (HCC)   Chronic respiratory failure with hypoxia (HCC)   AKI (acute kidney injury) ([place])  1. Small bowel obstruction.  General surgery following.  She had multiple abdominal surgeries in the past.  She did not tolerate NG tube placement.    She was treated conservatively with bowel rest and Dulcolax enemas.  She is now moving her bowels and passing gas.  She is not having nausea and vomiting.  She is tolerating liquids.  She was said to discharge home.  She will follow-up with her primary surgeon at [facility].. 2. Mild acute kidney injury.  Related to dehydration.  Improved with IV fluids 3. COPD with chronic respiratory failure.  Currently on home oxygen requirement.  Continue bronchodilators as needed. 4. Diabetes.    Treated with sliding scale insulin.  Oral medications including metformin, glipizide and Januvia will be resumed on discharge.  Blood sugars  have been stable 5. Hypertension.  Chronically on enalapril.  Blood pressures currently stable.  Discharge Instructions  Discharge Instructions    Diet - low sodium heart healthy   Complete by:  As directed    Increase activity slowly   Complete by:  As directed      Allergies as of 08/06/2018      Reactions   Hydrocodone Itching   Penicillins Hives, Itching   Has patient had a PCN reaction causing immediate rash, facial/tongue/throat swelling, SOB or lightheadedness with hypotension: Yes Has patient had a PCN reaction causing severe rash involving mucus membranes or skin necrosis: No Has patient had a PCN reaction that required hospitalization No Has patient had a PCN reaction occurring within the last 10 years: No If all of the above answers are "NO", then may proceed with Cephalosporin use. Has patient had a PCN reaction causing immediate rash, facial/tongue/throat swelling, SOB or lightheadedness with hypotension: Yes Has patient had a PCN reaction causing severe rash involving mucus membranes or skin necrosis: No Has patient had a PCN reaction that required hospitalization No Has patient had a PCN reaction occurring within the last 10 years: No If all of the above answers are "NO", then may proceed with Cephalosporin use. Can tolerate cephalosporins, per pt Electronically signed by: Rudene Christians, [facility] 06/07/2013 10:43 AM      Medication List    STOP taking these medications   oseltamivir 75 MG capsule Commonly known as:  TAMIFLU   predniSONE 20 MG tablet Commonly known as:  Deltasone     TAKE these medications   acetaminophen 325 MG tablet Commonly known as:  TYLENOL Take  2 tablets (650 mg total) by mouth every 6 (six) hours as needed for mild pain, fever or headache.   albuterol 108 (90 Base) MCG/ACT inhaler Commonly known as:  VENTOLIN HFA Inhale 2 puffs into the lungs every 4 (four) hours as needed. For shortness of breath   albuterol (2.5 MG/3ML) 0.083%  nebulizer solution Commonly known as:  PROVENTIL Take 3 mLs (2.5 mg total) by nebulization every 6 (six) hours as needed for wheezing or shortness of breath.   bisacodyl 10 MG suppository Commonly known as:  DULCOLAX Place 1 suppository (10 mg total) rectally daily as needed for moderate constipation.   busPIRone 10 MG tablet Commonly known as:  BUSPAR Take 10 mg by mouth 2 (two) times daily.   citalopram 20 MG tablet Commonly known as:  CELEXA Take 20 mg by mouth daily.   enalapril 20 MG tablet Commonly known as:  VASOTEC Take 20 mg by mouth daily.   Fluticasone-Salmeterol 250-50 MCG/DOSE Aepb Commonly known as:  Advair Diskus Inhale 1 puff into the lungs 2 (two) times daily.   gabapentin 300 MG capsule Commonly known as:  NEURONTIN Take 300 mg by mouth 3 (three) times daily.   glipiZIDE 10 MG tablet Commonly known as:  GLUCOTROL Take 1 tablet (10 mg total) by mouth 2 (two) times daily before a meal.   lovastatin 40 MG tablet Commonly known as:  MEVACOR Take 40 mg by mouth at bedtime.   metFORMIN 1000 MG tablet Commonly known as:  GLUCOPHAGE Take 1 tablet (1,000 mg total) by mouth 2 (two) times daily with a meal.   ondansetron 8 MG disintegrating tablet Commonly known as:  Zofran ODT Take 1 tablet (8 mg total) by mouth every 8 (eight) hours as needed for nausea or vomiting.   sitaGLIPtin 50 MG tablet Commonly known as:  Januvia Take 1 tablet (50 mg total) by mouth daily.   tiotropium 18 MCG inhalation capsule Commonly known as:  Spiriva HandiHaler Place 1 capsule (18 mcg total) into inhaler and inhale daily.   tiZANidine 4 MG tablet Commonly known as:  ZANAFLEX 4 mg.   traMADol 50 MG tablet Commonly known as:  ULTRAM Take 50 mg by mouth every 8 (eight) hours as needed for moderate pain.       Allergies  Allergen Reactions  . Hydrocodone Itching  . Penicillins Hives and Itching    Has patient had a PCN reaction causing immediate rash,  facial/tongue/throat swelling, SOB or lightheadedness with hypotension: Yes Has patient had a PCN reaction causing severe rash involving mucus membranes or skin necrosis: No Has patient had a PCN reaction that required hospitalization No Has patient had a PCN reaction occurring within the last 10 years: No If all of the above answers are "NO", then may proceed with Cephalosporin use.  Has patient had a PCN reaction causing immediate rash, facial/tongue/throat swelling, SOB or lightheadedness with hypotension: Yes Has patient had a PCN reaction causing severe rash involving mucus membranes or skin necrosis: No Has patient had a PCN reaction that required hospitalization No Has patient had a PCN reaction occurring within the last 10 years: No If all of the above answers are "NO", then may proceed with Cephalosporin use. Can tolerate cephalosporins, per pt Electronically signed by: Rudene Christians, [facility] 06/07/2013 10:43 AM    Consultations:  General surgery   Procedures/Studies: Ct Abdomen Pelvis W Contrast  Result Date: 08/02/2018 CLINICAL DATA:  Abdominal pain and vomiting for 4 days. EXAM: CT ABDOMEN AND PELVIS WITH  CONTRAST TECHNIQUE: Multidetector CT imaging of the abdomen and pelvis was performed using the standard protocol following bolus administration of intravenous contrast. CONTRAST:  47mL OMNIPAQUE IOHEXOL 300 MG/ML  SOLN COMPARISON:  08/21/2017.  PET-CT 01/07/2018. FINDINGS: Lower chest: Unremarkable Hepatobiliary: 12 mm low-density lesion inferior right liver is stable since 08/21/2017. Mild prominence of the intrahepatic bile ducts is stable. Gallbladder surgically absent. Common bile duct measures 9 mm diameter, similar to prior study. Pancreas: No focal mass lesion. No dilatation of the main duct. No intraparenchymal cyst. No peripancreatic edema. Spleen: No splenomegaly. No focal mass lesion. Adrenals/Urinary Tract: No adrenal nodule or mass. Kidneys unremarkable. No evidence for  hydroureter. The urinary bladder appears normal for the degree of distention. Stomach/Bowel: Stomach is unremarkable. No gastric wall thickening. No evidence of outlet obstruction. Duodenum is normally positioned as is the ligament of Treitz. Jejunal loops are dilated and fluid-filled measuring up to 5.8 cm diameter. In the mid to distal small bowel, and abrupt transition zone is identified (axial 67/series 2 and coronal image 41/series 7). Small bowel then becomes dilated again, in the region of a small bowel anastomosis. Approximately 20 cm distal to the first transition zone a second abrupt transition is identified (axial 61/2 and coronal 46/7). There are some adhesions in the region of both transition zones with evidence of tethering to the sigmoid colon. Small bowel distal to the second transition point is completely decompressed into the ileocecal valve. Colon is largely decompressed throughout. Vascular/Lymphatic: There is abdominal aortic atherosclerosis without aneurysm. There is no gastrohepatic or hepatoduodenal ligament lymphadenopathy. No intraperitoneal or retroperitoneal lymphadenopathy. No pelvic sidewall lymphadenopathy. Reproductive: The uterus is unremarkable.  There is no adnexal mass. Other: No intraperitoneal free fluid. Musculoskeletal: Nodularity in the anterior abdominal wall is similar to prior and may be from prior sites of injection. No worrisome lytic or sclerotic osseous abnormality. Mild compression deformity at T11 is stable. IMPRESSION: 1. Mechanical small bowel obstruction in the mid to distal small bowel. 2 discrete transition zones are identified with about 20 cm intervening dilated fluid-filled small bowel. Multiple adhesions are evident in the region of obstruction and obstructive etiology may be related to the adhesions although closed loop obstruction and internal hernia with obstruction can not be excluded. No appreciable small bowel wall thickening or pneumatosis. No  substantial intraperitoneal free fluid. 2. Stable 12 mm low-density lesion inferior right liver, indeterminate. This showed no hypermetabolism on recent PET-CT. 3.  Aortic Atherosclerois (ICD10-170.0) Electronically Signed   By: Misty Stanley M.D.   On: 08/02/2018 17:29       Subjective: Nausea and vomiting are better.  She is having bowel movements.  Discharge Exam: Vitals:   08/05/18 2017 08/05/18 2202 08/06/18 0552 08/06/18 0805  BP:  (!) 149/62 (!) 148/67   Pulse:  82 82   Resp:  18 18   Temp:  98.9 F (37.2 C) 98.4 F (36.9 C)   TempSrc:  Oral Oral   SpO2: 95% 95% 97% 93%  Weight:      Height:        General: Pt is alert, awake, not in acute distress Cardiovascular: RRR, S1/S2 +, no rubs, no gallops Respiratory: CTA bilaterally, no wheezing, no rhonchi Abdominal: Soft, NT, ND, bowel sounds + Extremities: no edema, no cyanosis    The results of significant diagnostics from this hospitalization (including imaging, microbiology, ancillary and laboratory) are listed below for reference.     Microbiology: No results found for this or any previous visit (from  the past 240 hour(s)).   Labs: BNP (last 3 results) Recent Labs    05/12/18 1651  BNP 829.5*   Basic Metabolic Panel: Recent Labs  Lab 08/02/18 1424 08/03/18 0455 08/04/18 0514 08/06/18 0610  NA 134* 139 139 138  K 4.5 4.0 3.5 3.4*  CL 87* 93* 95* 99  CO2 32 31 32 28  GLUCOSE 191* 165* 116* 85  BUN 66* 55* 34* 11  CREATININE 1.38* 1.08* 0.88 0.68  CALCIUM 9.1 8.8* 8.9 8.3*  MG  --  1.8  --   --    Liver Function Tests: Recent Labs  Lab 08/02/18 1424  AST 12*  ALT 17  ALKPHOS 60  BILITOT 0.9  PROT 7.5  ALBUMIN 3.9   Recent Labs  Lab 08/02/18 1424  LIPASE 19   No results for input(s): AMMONIA in the last 168 hours. CBC: Recent Labs  Lab 08/02/18 1424 08/04/18 0514  WBC 9.0 10.2  HGB 14.9 13.2  HCT 44.2 41.4  MCV 91.7 94.7  PLT 284 259   Cardiac Enzymes: No results for  input(s): CKTOTAL, CKMB, CKMBINDEX, TROPONINI in the last 168 hours. BNP: Invalid input(s): POCBNP CBG: Recent Labs  Lab 08/05/18 1203 08/05/18 1638 08/05/18 2205 08/06/18 0749 08/06/18 1119  GLUCAP 140* 105* 155* 95 95   D-Dimer No results for input(s): DDIMER in the last 72 hours. Hgb A1c No results for input(s): HGBA1C in the last 72 hours. Lipid Profile No results for input(s): CHOL, HDL, LDLCALC, TRIG, CHOLHDL, LDLDIRECT in the last 72 hours. Thyroid function studies No results for input(s): TSH, T4TOTAL, T3FREE, THYROIDAB in the last 72 hours.  Invalid input(s): FREET3 Anemia work up No results for input(s): VITAMINB12, FOLATE, FERRITIN, TIBC, IRON, RETICCTPCT in the last 72 hours. Urinalysis    Component Value Date/Time   COLORURINE YELLOW 08/02/2018 1315   APPEARANCEUR CLEAR 08/02/2018 1315   LABSPEC 1.041 (H) 08/02/2018 1315   PHURINE 5.0 08/02/2018 1315   GLUCOSEU NEGATIVE 08/02/2018 1315   HGBUR NEGATIVE 08/02/2018 1315   BILIRUBINUR NEGATIVE 08/02/2018 1315   KETONESUR 20 (A) 08/02/2018 1315   PROTEINUR NEGATIVE 08/02/2018 1315   UROBILINOGEN 0.2 01/04/2011 0914   NITRITE NEGATIVE 08/02/2018 1315   LEUKOCYTESUR NEGATIVE 08/02/2018 1315   Sepsis Labs Invalid input(s): PROCALCITONIN,  WBC,  LACTICIDVEN Microbiology No results found for this or any previous visit (from the past 240 hour(s)).   Time coordinating discharge: 83mins  SIGNED:   Kathie Dike, MD  [facility] 08/06/2018, 7:38 PM   If 7PM-7AM, please contact night-coverage [email or web address]

## 2018-08-06 NOTE — Discharge Instructions (Signed)
Bowel Obstruction  A bowel obstruction is a blockage in the small or large bowel. The bowel, which is also called the intestine, is a long, slender tube that connects the stomach to the anus. When a person eats and drinks, food and fluids go from the mouth to the stomach to the small bowel. This is where most of the nutrients in the food and fluids are absorbed. After the small bowel, material passes through the large bowel for further absorption until any leftover material leaves the body as stool through the anus during a bowel movement.  A bowel obstruction will prevent food and fluids from passing through the bowel as they normally do during digestion. The bowel can become partially or completely blocked. If this condition is not treated, it can be dangerous because the bowel could rupture.  What are the causes?  Common causes of this condition include:  · Scar tissue (adhesions) from previous surgery or treatment with high-energy X-rays (radiation).  · Recent surgery. This may cause the movements of the bowel to slow down and cause food to block the intestine.  · Inflammatory bowel disease, such asCrohn's disease or diverticulitis.  · Growths or tumors.  · A bulging organ (hernia).  · Twisting of the bowel (volvulus).  · A foreign body.  · Slipping of a part of the bowel into another part (intussusception).  What are the signs or symptoms?  Symptoms of this condition include:  · Pain in the abdomen. Depending on the degree of obstruction, pain may be:  ? Mild or severe.  ? Dull cramping or sharp pain.  ? In one area or in the entire abdomen.  · Nausea and vomiting. Vomit may be greenish or a yellow bile color.  · Bloating in the abdomen.  · Difficulty passing stool (constipation).  · Lack of passing gas.  · Frequent belching.  · Diarrhea. This may occur if the obstruction is partial and runny stool is able to leak around the obstruction.  How is this diagnosed?  This condition may be diagnosed based on:  · A  physical exam.  · Medical history.  · Imaging tests of the abdomen or pelvis, such as X-ray or CT scan.  · Blood or urine tests.  How is this treated?  Treatment for this condition depends on the cause and severity of the problem. Treatment may include:  · Fluids and pain medicines that are given through an IV. Your health care provider may instruct you not to eat or drink if you have nausea or vomiting.  · Eating a simple diet. You may be asked to consume a clear liquid diet for several days. This allows the bowel to rest.  · Placement of a small tube (nasogastric tube) into the stomach. This will relieve pain, discomfort, and nausea by removing blocked air and fluids from the stomach. It can also help the obstruction clear up faster.  · Surgery. This may be required if other treatments do not work. Surgery may be required for:  ? Bowel obstruction from a hernia. This can be an emergency procedure.  ? Scar tissue that causes frequent or severe obstructions.  Follow these instructions at home:  Medicines  · Take over-the-counter and prescription medicines only as told by your health care provider.  · If you were prescribed an antibiotic medicine, take it as told by your health care provider. Do not stop taking the antibiotic even if you start to feel better.  General instructions  ·   Follow instructions from your health care provider about eating restrictions. You may need to avoid solid foods and consume only clear liquids until your condition improves.  · Return to your normal activities as told by your health care provider. Ask your health care provider what activities are safe for you.  · Avoid sitting for a long time without moving. Get up to take short walks every 1-2 hours. This is important to improve blood flow and breathing. Ask for help if you feel weak or unsteady.  · Keep all follow-up visits as told by your health care provider. This is important.  How is this prevented?  After having a bowel  obstruction, you are more likely to have another. You may do the following things to prevent another obstruction:  · If you have a long-term (chronic) disease, pay attention to your symptoms and contact your health care provider if you have questions or concerns.  · Avoid becoming constipated. To prevent or treat constipation, your health care provider may recommend that you:  ? Drink enough fluid to keep your urine pale yellow.  ? Take over-the-counter or prescription medicines.  ? Eat foods that are high in fiber, such as beans, whole grains, and fresh fruits and vegetables.  ? Limit foods that are high in fat and processed sugars, such as fried or sweet foods.  · Stay active. Exercise for 30 minutes or more, 5 or more days each week. Ask your health care provider which exercises are safe for you.  · Avoid stress. Find ways to reduce stress, such as meditation, exercise, or taking time for activities that relax you.  · Instead of eating three large meals each day, eat three small meals with three small snacks.  · Work with a dietitian to make a healthy meal plan that works for you.  · Do not use any products that contain nicotine or tobacco, such as cigarettes and e-cigarettes. If you need help quitting, ask your health care provider.  Contact a health care provider if you:  · Have a fever.  · Have chills.  Get help right away if you:  · Have increased pain or cramping.  · Vomit blood.  · Have uncontrolled vomiting or nausea.  · Cannot drink fluids because of vomiting or pain.  · Become confused.  · Begin feeling very thirsty (dehydrated).  · Have severe bloating.  · Feel extremely weak or you faint.  Summary  · A bowel obstruction is a blockage in the small or large bowel.  · A bowel obstruction will prevent food and fluids from passing through the bowel as they normally do during digestion.  · Treatment for this condition depends on the cause and severity of the problem. It may include fluids and pain medicines  through an IV, a simple diet, a nasogastric tube, or surgery.  · Follow instructions from your health care provider about eating restrictions. You may need to avoid solid foods and consume only clear liquids until your condition improves.  This information is not intended to replace advice given to you by your health care provider. Make sure you discuss any questions you have with your health care provider.  Document Released: 06/23/2005 Document Revised: 08/18/2017 Document Reviewed: 08/18/2017  Elsevier Interactive Patient Education © 2019 [employer]

## 2018-08-06 NOTE — Plan of Care (Signed)

## 2018-08-06 NOTE — Progress Notes (Signed)
  Subjective: Patient reports having bowel movement yesterday evening.  Patient has no significant abdominal pain, nausea this morning.  Objective: Vital signs in last 24 hours: Temp:  [98.3 F (36.8 C)-98.9 F (37.2 C)] 98.4 F (36.9 C) (04/18 0552) Pulse Rate:  [82-83] 82 (04/18 0552) Resp:  [18] 18 (04/18 0552) BP: (148-163)/(62-75) 148/67 (04/18 0552) SpO2:  [94 %-97 %] 97 % (04/18 0552) Last BM Date: 07/30/18  Intake/Output from previous day: 04/17 0701 - 04/18 0700 In: 3478.9 [I.V.:3478.9] Out: -  Intake/Output this shift: No intake/output data recorded.  General appearance: alert, cooperative and no distress GI: Soft, nontender, nondistended.  No rigidity noted.  Bowel sounds present.  Lab Results:  Recent Labs    08/04/18 0514  WBC 10.2  HGB 13.2  HCT 41.4  PLT 259   BMET Recent Labs    08/04/18 0514 08/06/18 0610  NA 139 138  K 3.5 3.4*  CL 95* 99  CO2 32 28  GLUCOSE 116* 85  BUN 34* 11  CREATININE 0.88 0.68  CALCIUM 8.9 8.3*   PT/INR No results for input(s): LABPROT, INR in the last 72 hours.  Studies/Results: No results found.  Anti-infectives: Anti-infectives (From admission, onward)   None      Assessment/Plan: Impression: Recurrent small bowel obstruction, resolving. Plan: No need for acute surgical intervention at this time.  Diet was advanced this morning.  Okay for discharge from surgery standpoint.  Patient will try to follow-up with Dr. Florene Glen at [facility].  LOS: 3 days    Aviva Signs 08/06/2018

## 2018-08-06 NOTE — Plan of Care (Signed)
  Problem: Education: Goal: Knowledge of General Education information will improve Description Including pain rating scale, medication(s)/side effects and non-pharmacologic comfort measures 08/06/2018 1411 by Rance Muir, RN Outcome: Adequate for Discharge 08/06/2018 0807 by Rance Muir, RN Outcome: Progressing   Problem: Health Behavior/Discharge Planning: Goal: Ability to manage health-related needs will improve 08/06/2018 1411 by Rance Muir, RN Outcome: Adequate for Discharge 08/06/2018 0807 by Rance Muir, RN Outcome: Progressing   Problem: Clinical Measurements: Goal: Ability to maintain clinical measurements within normal limits will improve 08/06/2018 1411 by Rance Muir, RN Outcome: Adequate for Discharge 08/06/2018 0807 by Rance Muir, RN Outcome: Progressing Goal: Will remain free from infection 08/06/2018 1411 by Rance Muir, RN Outcome: Adequate for Discharge 08/06/2018 0807 by Rance Muir, RN Outcome: Progressing Goal: Diagnostic test results will improve 08/06/2018 1411 by Rance Muir, RN Outcome: Adequate for Discharge 08/06/2018 0807 by Rance Muir, RN Outcome: Progressing Goal: Respiratory complications will improve 08/06/2018 1411 by Rance Muir, RN Outcome: Adequate for Discharge 08/06/2018 0807 by Rance Muir, RN Outcome: Progressing Goal: Cardiovascular complication will be avoided 08/06/2018 1411 by Rance Muir, RN Outcome: Adequate for Discharge 08/06/2018 0807 by Rance Muir, RN Outcome: Progressing   Problem: Activity: Goal: Risk for activity intolerance will decrease 08/06/2018 1411 by Rance Muir, RN Outcome: Adequate for Discharge 08/06/2018 0807 by Rance Muir, RN Outcome: Progressing   Problem: Nutrition: Goal: Adequate nutrition will be maintained 08/06/2018 1411 by Rance Muir, RN Outcome: Adequate for Discharge 08/06/2018 0807 by Rance Muir, RN Outcome: Progressing   Problem: Coping: Goal: Level of anxiety will decrease 08/06/2018 1411 by Rance Muir, RN Outcome: Adequate for  Discharge 08/06/2018 0807 by Rance Muir, RN Outcome: Progressing   Problem: Elimination: Goal: Will not experience complications related to bowel motility 08/06/2018 1411 by Rance Muir, RN Outcome: Adequate for Discharge 08/06/2018 0807 by Rance Muir, RN Outcome: Progressing Goal: Will not experience complications related to urinary retention 08/06/2018 1411 by Rance Muir, RN Outcome: Adequate for Discharge 08/06/2018 0807 by Rance Muir, RN Outcome: Progressing   Problem: Pain Managment: Goal: General experience of comfort will improve 08/06/2018 1411 by Rance Muir, RN Outcome: Adequate for Discharge 08/06/2018 0807 by Rance Muir, RN Outcome: Progressing   Problem: Safety: Goal: Ability to remain free from injury will improve 08/06/2018 1411 by Rance Muir, RN Outcome: Adequate for Discharge 08/06/2018 0807 by Rance Muir, RN Outcome: Progressing

## 2018-08-09 DIAGNOSIS — E1140 Type 2 diabetes mellitus with diabetic neuropathy, unspecified: Secondary | ICD-10-CM | POA: Diagnosis not present

## 2018-08-09 DIAGNOSIS — J449 Chronic obstructive pulmonary disease, unspecified: Secondary | ICD-10-CM | POA: Diagnosis not present

## 2018-08-09 DIAGNOSIS — K56609 Unspecified intestinal obstruction, unspecified as to partial versus complete obstruction: Secondary | ICD-10-CM | POA: Diagnosis not present

## 2018-08-09 DIAGNOSIS — E119 Type 2 diabetes mellitus without complications: Secondary | ICD-10-CM | POA: Diagnosis not present

## 2018-08-09 DIAGNOSIS — Z6831 Body mass index (BMI) 31.0-31.9, adult: Secondary | ICD-10-CM | POA: Diagnosis not present

## 2018-08-12 DIAGNOSIS — J441 Chronic obstructive pulmonary disease with (acute) exacerbation: Secondary | ICD-10-CM | POA: Diagnosis not present

## 2018-08-14 ENCOUNTER — Emergency Department (HOSPITAL_COMMUNITY): Payer: Medicare HMO

## 2018-08-14 ENCOUNTER — Other Ambulatory Visit: Payer: Self-pay

## 2018-08-14 ENCOUNTER — Encounter (HOSPITAL_COMMUNITY): Payer: Self-pay | Admitting: *Deleted

## 2018-08-14 ENCOUNTER — Emergency Department (HOSPITAL_COMMUNITY)
Admission: EM | Admit: 2018-08-14 | Discharge: 2018-08-14 | Disposition: A | Discharge status: Other Acute Inpatient Hospital | Payer: Medicare HMO | Attending: General Surgery | Admitting: General Surgery

## 2018-08-14 DIAGNOSIS — F17210 Nicotine dependence, cigarettes, uncomplicated: Secondary | ICD-10-CM | POA: Diagnosis not present

## 2018-08-14 DIAGNOSIS — E878 Other disorders of electrolyte and fluid balance, not elsewhere classified: Secondary | ICD-10-CM | POA: Diagnosis not present

## 2018-08-14 DIAGNOSIS — R1010 Upper abdominal pain, unspecified: Secondary | ICD-10-CM | POA: Diagnosis not present

## 2018-08-14 DIAGNOSIS — E119 Type 2 diabetes mellitus without complications: Secondary | ICD-10-CM | POA: Insufficient documentation

## 2018-08-14 DIAGNOSIS — K56699 Other intestinal obstruction unspecified as to partial versus complete obstruction: Secondary | ICD-10-CM | POA: Diagnosis not present

## 2018-08-14 DIAGNOSIS — I998 Other disorder of circulatory system: Secondary | ICD-10-CM | POA: Diagnosis not present

## 2018-08-14 DIAGNOSIS — Z7984 Long term (current) use of oral hypoglycemic drugs: Secondary | ICD-10-CM | POA: Diagnosis not present

## 2018-08-14 DIAGNOSIS — I700 Atherosclerosis of aorta: Secondary | ICD-10-CM | POA: Diagnosis not present

## 2018-08-14 DIAGNOSIS — R1084 Generalized abdominal pain: Secondary | ICD-10-CM | POA: Diagnosis not present

## 2018-08-14 DIAGNOSIS — Z66 Do not resuscitate: Secondary | ICD-10-CM | POA: Diagnosis not present

## 2018-08-14 DIAGNOSIS — K56609 Unspecified intestinal obstruction, unspecified as to partial versus complete obstruction: Secondary | ICD-10-CM

## 2018-08-14 DIAGNOSIS — J9601 Acute respiratory failure with hypoxia: Secondary | ICD-10-CM | POA: Diagnosis not present

## 2018-08-14 DIAGNOSIS — R918 Other nonspecific abnormal finding of lung field: Secondary | ICD-10-CM | POA: Diagnosis not present

## 2018-08-14 DIAGNOSIS — K55019 Acute (reversible) ischemia of small intestine, extent unspecified: Secondary | ICD-10-CM | POA: Diagnosis not present

## 2018-08-14 DIAGNOSIS — N179 Acute kidney failure, unspecified: Secondary | ICD-10-CM | POA: Diagnosis not present

## 2018-08-14 DIAGNOSIS — I4891 Unspecified atrial fibrillation: Secondary | ICD-10-CM | POA: Diagnosis not present

## 2018-08-14 DIAGNOSIS — J90 Pleural effusion, not elsewhere classified: Secondary | ICD-10-CM | POA: Diagnosis not present

## 2018-08-14 DIAGNOSIS — J449 Chronic obstructive pulmonary disease, unspecified: Secondary | ICD-10-CM | POA: Diagnosis not present

## 2018-08-14 DIAGNOSIS — R740 Nonspecific elevation of levels of transaminase and lactic acid dehydrogenase [LDH]: Secondary | ICD-10-CM | POA: Diagnosis not present

## 2018-08-14 DIAGNOSIS — N170 Acute kidney failure with tubular necrosis: Secondary | ICD-10-CM | POA: Diagnosis not present

## 2018-08-14 DIAGNOSIS — R0902 Hypoxemia: Secondary | ICD-10-CM | POA: Diagnosis not present

## 2018-08-14 DIAGNOSIS — Z20828 Contact with and (suspected) exposure to other viral communicable diseases: Secondary | ICD-10-CM | POA: Diagnosis not present

## 2018-08-14 DIAGNOSIS — R9431 Abnormal electrocardiogram [ECG] [EKG]: Secondary | ICD-10-CM | POA: Diagnosis not present

## 2018-08-14 DIAGNOSIS — E872 Acidosis, unspecified: Secondary | ICD-10-CM

## 2018-08-14 DIAGNOSIS — K631 Perforation of intestine (nontraumatic): Secondary | ICD-10-CM | POA: Diagnosis not present

## 2018-08-14 DIAGNOSIS — Z9911 Dependence on respirator [ventilator] status: Secondary | ICD-10-CM | POA: Diagnosis not present

## 2018-08-14 DIAGNOSIS — R531 Weakness: Secondary | ICD-10-CM

## 2018-08-14 DIAGNOSIS — R6521 Severe sepsis with septic shock: Secondary | ICD-10-CM | POA: Diagnosis not present

## 2018-08-14 DIAGNOSIS — I10 Essential (primary) hypertension: Secondary | ICD-10-CM | POA: Diagnosis not present

## 2018-08-14 DIAGNOSIS — Z452 Encounter for adjustment and management of vascular access device: Secondary | ICD-10-CM | POA: Diagnosis not present

## 2018-08-14 DIAGNOSIS — K458 Other specified abdominal hernia without obstruction or gangrene: Secondary | ICD-10-CM | POA: Diagnosis not present

## 2018-08-14 DIAGNOSIS — K668 Other specified disorders of peritoneum: Secondary | ICD-10-CM | POA: Insufficient documentation

## 2018-08-14 DIAGNOSIS — R0689 Other abnormalities of breathing: Secondary | ICD-10-CM | POA: Diagnosis not present

## 2018-08-14 DIAGNOSIS — Z79899 Other long term (current) drug therapy: Secondary | ICD-10-CM | POA: Insufficient documentation

## 2018-08-14 DIAGNOSIS — R000 Tachycardia, unspecified: Secondary | ICD-10-CM | POA: Diagnosis not present

## 2018-08-14 DIAGNOSIS — J9602 Acute respiratory failure with hypercapnia: Secondary | ICD-10-CM | POA: Diagnosis not present

## 2018-08-14 DIAGNOSIS — B998 Other infectious disease: Secondary | ICD-10-CM | POA: Diagnosis not present

## 2018-08-14 DIAGNOSIS — K5900 Constipation, unspecified: Secondary | ICD-10-CM | POA: Diagnosis not present

## 2018-08-14 DIAGNOSIS — Z4682 Encounter for fitting and adjustment of non-vascular catheter: Secondary | ICD-10-CM | POA: Diagnosis not present

## 2018-08-14 DIAGNOSIS — R188 Other ascites: Secondary | ICD-10-CM | POA: Diagnosis not present

## 2018-08-14 DIAGNOSIS — A419 Sepsis, unspecified organism: Secondary | ICD-10-CM | POA: Diagnosis not present

## 2018-08-14 DIAGNOSIS — I959 Hypotension, unspecified: Secondary | ICD-10-CM | POA: Diagnosis not present

## 2018-08-14 DIAGNOSIS — K460 Unspecified abdominal hernia with obstruction, without gangrene: Secondary | ICD-10-CM | POA: Diagnosis not present

## 2018-08-14 DIAGNOSIS — I70202 Unspecified atherosclerosis of native arteries of extremities, left leg: Secondary | ICD-10-CM | POA: Diagnosis not present

## 2018-08-14 LAB — URINALYSIS, ROUTINE W REFLEX MICROSCOPIC
Bilirubin Urine: NEGATIVE
Glucose, UA: 50 mg/dL — AB
Ketones, ur: NEGATIVE mg/dL
Leukocytes,Ua: NEGATIVE
Nitrite: NEGATIVE
Protein, ur: NEGATIVE mg/dL
Specific Gravity, Urine: 1.015 (ref 1.005–1.030)
pH: 5 (ref 5.0–8.0)

## 2018-08-14 LAB — CBC WITH DIFFERENTIAL/PLATELET
Abs Immature Granulocytes: 0.55 10*3/uL — ABNORMAL HIGH (ref 0.00–0.07)
Basophils Absolute: 0.1 10*3/uL (ref 0.0–0.1)
Basophils Relative: 1 %
Eosinophils Absolute: 5.5 10*3/uL — ABNORMAL HIGH (ref 0.0–0.5)
Eosinophils Relative: 28 %
HCT: 41.3 % (ref 36.0–46.0)
Hemoglobin: 13.7 g/dL (ref 12.0–15.0)
Immature Granulocytes: 3 %
Lymphocytes Relative: 3 %
Lymphs Abs: 0.5 10*3/uL — ABNORMAL LOW (ref 0.7–4.0)
MCH: 30.6 pg (ref 26.0–34.0)
MCHC: 33.2 g/dL (ref 30.0–36.0)
MCV: 92.4 fL (ref 80.0–100.0)
Monocytes Absolute: 1 10*3/uL (ref 0.1–1.0)
Monocytes Relative: 5 %
Neutro Abs: 12.3 10*3/uL — ABNORMAL HIGH (ref 1.7–7.7)
Neutrophils Relative %: 60 %
Platelets: 268 10*3/uL (ref 150–400)
RBC: 4.47 MIL/uL (ref 3.87–5.11)
RDW: 13.2 % (ref 11.5–15.5)
WBC: 20 10*3/uL — ABNORMAL HIGH (ref 4.0–10.5)
nRBC: 0 % (ref 0.0–0.2)

## 2018-08-14 LAB — COMPREHENSIVE METABOLIC PANEL
ALT: 67 U/L — ABNORMAL HIGH (ref 0–44)
AST: 125 U/L — ABNORMAL HIGH (ref 15–41)
Albumin: 1.9 g/dL — ABNORMAL LOW (ref 3.5–5.0)
Alkaline Phosphatase: 71 U/L (ref 38–126)
Anion gap: 24 — ABNORMAL HIGH (ref 5–15)
BUN: 81 mg/dL — ABNORMAL HIGH (ref 8–23)
CO2: 15 mmol/L — ABNORMAL LOW (ref 22–32)
Calcium: 6.4 mg/dL — CL (ref 8.9–10.3)
Chloride: 94 mmol/L — ABNORMAL LOW (ref 98–111)
Creatinine, Ser: 4.31 mg/dL — ABNORMAL HIGH (ref 0.44–1.00)
GFR calc Af Amer: 12 mL/min — ABNORMAL LOW (ref >60)
GFR calc non Af Amer: 10 mL/min — ABNORMAL LOW (ref >60)
Glucose, Bld: 169 mg/dL — ABNORMAL HIGH (ref 70–99)
Potassium: 4.8 mmol/L (ref 3.5–5.1)
Sodium: 133 mmol/L — ABNORMAL LOW (ref 135–145)
Total Bilirubin: 0.6 mg/dL (ref 0.3–1.2)
Total Protein: 4.4 g/dL — ABNORMAL LOW (ref 6.5–8.1)

## 2018-08-14 LAB — SARS CORONAVIRUS 2 BY RT PCR (HOSPITAL ORDER, PERFORMED IN ~~LOC~~ HOSPITAL LAB): SARS Coronavirus 2: NEGATIVE

## 2018-08-14 LAB — LACTIC ACID, PLASMA
Lactic Acid, Venous: 6.9 mmol/L (ref 0.5–1.9)
Lactic Acid, Venous: 3 mmol/L (ref 0.5–1.9)

## 2018-08-14 LAB — I-STAT CREATININE, ED: Creatinine, Ser: 4.2 mg/dL — ABNORMAL HIGH (ref 0.44–1.00)

## 2018-08-14 LAB — TROPONIN I: Troponin I: 0.06 ng/mL (ref <0.03)

## 2018-08-14 MED ORDER — SODIUM CHLORIDE 0.9 % IV BOLUS (SEPSIS)
1000.0000 mL | Freq: Once | INTRAVENOUS | Status: AC
  Administered 2018-08-14: 1000 mL via INTRAVENOUS

## 2018-08-14 MED ORDER — VANCOMYCIN HCL IN DEXTROSE 1-5 GM/200ML-% IV SOLN: 1000.0000 mg | INTRAVENOUS | Status: DC

## 2018-08-14 MED ORDER — FENTANYL CITRATE (PF) 100 MCG/2ML IJ SOLN: 50.0000 ug | INTRAMUSCULAR | Status: DC | PRN

## 2018-08-14 MED ORDER — NOREPINEPHRINE 4 MG/250ML-% IV SOLN
2.0000 ug/min | INTRAVENOUS | Status: DC
  Administered 2018-08-14 (×2): 2 ug/min via INTRAVENOUS
  Filled 2018-08-14: qty 250

## 2018-08-14 MED ORDER — PHENYLEPHRINE 40 MCG/ML (10ML) SYRINGE FOR IV PUSH (FOR BLOOD PRESSURE SUPPORT)
PREFILLED_SYRINGE | INTRAVENOUS | Status: AC
  Administered 2018-08-14: 10:00:00
  Filled 2018-08-14: qty 10

## 2018-08-14 MED ORDER — SODIUM CHLORIDE 0.9 % IV SOLN
250.0000 mL | INTRAVENOUS | Status: DC
  Administered 2018-08-14: 250 mL via INTRAVENOUS

## 2018-08-14 MED ORDER — VANCOMYCIN HCL IN DEXTROSE 750-5 MG/150ML-% IV SOLN
750.0000 mg | Freq: Once | INTRAVENOUS | Status: AC
  Administered 2018-08-14: 08:00:00 750 mg via INTRAVENOUS

## 2018-08-14 MED ORDER — SODIUM CHLORIDE 0.9 % IV SOLN
2.0000 g | Freq: Once | INTRAVENOUS | Status: AC
  Administered 2018-08-14: 2 g via INTRAVENOUS
  Filled 2018-08-14: qty 2

## 2018-08-14 MED ORDER — SODIUM CHLORIDE 0.9 % IV SOLN
INTRAVENOUS | Status: DC
  Administered 2018-08-14: 09:00:00 via INTRAVENOUS

## 2018-08-14 MED ORDER — METRONIDAZOLE IN NACL 5-0.79 MG/ML-% IV SOLN
500.0000 mg | Freq: Once | INTRAVENOUS | Status: AC
  Administered 2018-08-14: 500 mg via INTRAVENOUS
  Filled 2018-08-14: qty 100

## 2018-08-14 MED ORDER — SODIUM CHLORIDE 0.9 % IV BOLUS
1000.0000 mL | Freq: Once | INTRAVENOUS | Status: AC
  Administered 2018-08-14: 07:00:00 1000 mL via INTRAVENOUS

## 2018-08-14 MED ORDER — FENTANYL CITRATE (PF) 100 MCG/2ML IJ SOLN
50.0000 ug | Freq: Once | INTRAMUSCULAR | Status: AC
  Administered 2018-08-14: 07:00:00 50 ug via INTRAVENOUS
  Filled 2018-08-14: qty 2

## 2018-08-14 MED ORDER — VANCOMYCIN HCL IN DEXTROSE 1-5 GM/200ML-% IV SOLN
1000.0000 mg | Freq: Once | INTRAVENOUS | Status: DC
  Filled 2018-08-14: qty 200

## 2018-08-14 MED ORDER — SODIUM CHLORIDE 0.9 % IV SOLN
1.0000 g | Freq: Three times a day (TID) | INTRAVENOUS | Status: DC
  Filled 2018-08-14 (×4): qty 1

## 2018-08-14 MED ORDER — VANCOMYCIN HCL IN DEXTROSE 750-5 MG/150ML-% IV SOLN
INTRAVENOUS | Status: AC
  Administered 2018-08-14: 750 mg via INTRAVENOUS
  Filled 2018-08-14: qty 150

## 2018-08-14 MED ORDER — VANCOMYCIN HCL IN DEXTROSE 1-5 GM/200ML-% IV SOLN
1000.0000 mg | Freq: Once | INTRAVENOUS | Status: AC
  Administered 2018-08-14: 07:00:00 1000 mg via INTRAVENOUS

## 2018-08-14 NOTE — ED Provider Notes (Signed)
I have spoken with Dr. Arnoldo Morale, our general surgeon who has evaluated the patient and agrees that the patient would best be served at a higher level of care.  Care was also discussed between Dr. Arnoldo Morale and Dr. Georganna Skeans at [facility] and it was agreed that the patient would best be served at [facility] where she had her prior surgery.  At this time she is still critically ill, she is requiring Levophed, her map is now above 60, she is not requiring any advanced airway techniques and her coronavirus test was negative.  I have discussed her care with a Westley Hummer at [facility] who has accepted the patient in transfer to the emergency department where they will decide whether this patient can go to the operating room or to the ICU.  EMTALA forms have been completed   Noemi Chapel, MD 08/14/18 [phone]

## 2018-08-14 NOTE — ED Notes (Signed)
Dr. Arnoldo Morale at bedside at this time.

## 2018-08-14 NOTE — Progress Notes (Signed)
Pharmacy Antibiotic Note  [name] is a 68 y.o. female admitted on 08/14/2018 with infection of unknown source   Pharmacy has been consulted for vancomycin  and aztreonam  Dosing.  Renal function is unstable.  Plan: Start aztreonam 2g IV q12h Loading dose:  vancomycin 1.75g x1 Maintenance dose:  vancomycin 1g IV q48h Goal vancomycin trough range:  15-20  mcg/mL Pharmacy will continue to monitor renal function, vancomycin troughs as clinically appropriate, cultures and patient progress.  Height: 5\' 4"  (162.6 cm) Weight: 186 lb 8.2 oz (84.6 kg) IBW/kg (Calculated) : 54.7  Temp (24hrs), Avg:98 F (36.7 C), Min:97.6 F (36.4 C), Max:98.3 F (36.8 C)  Recent Labs  Lab 08/14/18 0541 08/14/18 0605  WBC 20.0*  --   CREATININE 4.31* 4.20*  LATICACIDVEN 6.9*  --     Estimated Creatinine Clearance: 14.1 mL/min (A) (by C-G formula based on SCr of 4.2 mg/dL (H)).    Allergies  Allergen Reactions  . Hydrocodone Itching  . Penicillins Hives and Itching    Has patient had a PCN reaction causing immediate rash, facial/tongue/throat swelling, SOB or lightheadedness with hypotension: Yes Has patient had a PCN reaction causing severe rash involving mucus membranes or skin necrosis: No Has patient had a PCN reaction that required hospitalization No Has patient had a PCN reaction occurring within the last 10 years: No If all of the above answers are "NO", then may proceed with Cephalosporin use.  Has patient had a PCN reaction causing immediate rash, facial/tongue/throat swelling, SOB or lightheadedness with hypotension: Yes Has patient had a PCN reaction causing severe rash involving mucus membranes or skin necrosis: No Has patient had a PCN reaction that required hospitalization No Has patient had a PCN reaction occurring within the last 10 years: No If all of the above answers are "NO", then may proceed with Cephalosporin use. Can tolerate cephalosporins, per pt Electronically signed  by: Rudene Christians, [facility] 06/07/2013 10:43 AM    Antimicrobials this admission: aztreonam 4/26 >>  vancomycin 4/26 >>  metronidazole 4/26 >>  Dose adjustments this admission: vancomycin and aztreonam  Microbiology results: 4/26  [facility] x2:  pending 4/26 UCx: sent 4/26 SARS Coronavirus 2: negatvie 4/26  MRSA PCR:  pending  Thank you for allowing pharmacy to be a part of this patient's care.  Despina Pole 08/14/2018 9:14 AM

## 2018-08-14 NOTE — ED Notes (Signed)
CRITICAL VALUE ALERT  Critical Value:  Calcium 6.4, Trop 0.06  Date & Time Notied:  0700, 08/14/2018  Provider Notified: Dr. Christy Gentles  Orders Received/Actions taken: no new orders at this time

## 2018-08-14 NOTE — ED Notes (Signed)
Spoke to [facility] for transfer at this time. They will call back after talking to general surgery.

## 2018-08-14 NOTE — ED Provider Notes (Signed)
[facility] EMERGENCY DEPARTMENT Provider Note   CSN: [id number] Arrival date & time: 08/14/18  0434    History   Chief Complaint Chief Complaint  Patient presents with   Weakness    HPI CHAIRTY Kennedy is a 68 y.o. female.    Level 5 caveat due to acuity of condition The history is provided by the patient.  Weakness  Severity:  Severe Onset quality:  Gradual Duration:  2 days Timing:  Constant Progression:  Worsening Chronicity:  New Relieved by:  Nothing Worsened by:  Nothing Patient presents from home for generalized weakness for the past 2 days.  No fever/vomiting.  Patient only reports left hip pain-no falls reported She had a recent admission for small bowel obstruction.  EMS reports patient was hypotensive, and was given IV fluids in route.  Past Medical History:  Diagnosis Date   Arthritis    "hands, knees, feet"   Asthma    Colon polyps    COPD (chronic obstructive pulmonary disease) ([place])    Hyperlipidemia    Hypertension    Peripheral edema    Pneumonia 08/2010; 11/2010   SBO (small bowel obstruction) ([place]) 08/25/11   recurrent   Type II diabetes mellitus ([place])     Patient Active Problem List   Diagnosis Date Noted   Small bowel obstruction ([place]) 08/03/2018   Chronic respiratory failure with hypoxia ([place]) 08/03/2018   AKI (acute kidney injury) ([place]) 08/03/2018   Acute on chronic respiratory failure with hypoxia ([place]) 05/12/2018   Influenza A 05/12/2018   HLD (hyperlipidemia) 05/12/2018   Thrombocytopenia ([place]) 05/12/2018   COPD exacerbation ([place]) 11/06/2017   Back pain 11/06/2017   Pulmonary nodule, right 11/06/2017   History of colonic polyps 07/25/2015   FH: colon cancer 07/25/2015   Incarcerated ventral hernia 04/27/2013   SBO (small bowel obstruction) ([place]) 08/25/2011   Hypoxia 08/25/2011   Leucocytosis 08/25/2011   HTN (hypertension) 08/25/2011   COPD (chronic obstructive pulmonary disease) ([place]) 08/25/2011     TRIGGER FINGER 06/11/2009   KNEE, ARTHRITIS, DEGEN./OSTEO 09/08/2007   TIBIALIS TENDINITIS 02/23/2007   Type 2 diabetes mellitus without complication ([place]) 67/20/9470    Past Surgical History:  Procedure Laterality Date   APPENDECTOMY     CHOLECYSTECTOMY  1979   COLONOSCOPY  2008   Dr. Oneida Alar: 74mm sessile cecal polyp and 58mm sessile transverse colon polyp, torturous sigmoid colon, fragments of tubular adenomas on path    COLONOSCOPY WITH PROPOFOL N/A 08/13/2015   Procedure: COLONOSCOPY WITH PROPOFOL;  Surgeon: Danie Binder, MD;  Location: [facility] ENDO SUITE;  Service: Endoscopy;  Laterality: N/A;  0815   ESOPHAGEAL DILATION     remote past by Dr. Tamala Julian   HERNIA REPAIR     KNEE CARTILAGE SURGERY  07/2003   right/E-chart   LAPAROSCOPIC INCISIONAL / UMBILICAL / [place]  ? til 2002   "~ 13 times"   POLYPECTOMY  08/13/2015   Procedure: POLYPECTOMY;  Surgeon: Danie Binder, MD;  Location: [facility] ENDO SUITE;  Service: Endoscopy;;  ascending colon polyp, hepatic flexure polyp, transverse colon polyp, descending colon polyp, sigmoid colon polyp, rectal polyps times 3   SMALL INTESTINE SURGERY     Feb 2015 at [facility] and then remote past    [place]     OB History    Gravida      Para      Term      Preterm      AB  Living  1     SAB      TAB      Ectopic      Multiple      Live Births               Home Medications    Prior to Admission medications   Medication Sig Start Date End Date Taking? Authorizing Provider  acetaminophen (TYLENOL) 325 MG tablet Take 2 tablets (650 mg total) by mouth every 6 (six) hours as needed for mild pain, fever or headache. 05/14/18   Denton Brick, Courage, MD  albuterol (PROVENTIL HFA;VENTOLIN HFA) 108 (90 Base) MCG/ACT inhaler Inhale 2 puffs into the lungs every 4 (four) hours as needed. For shortness of breath 05/14/18   Roxan Hockey, MD  albuterol (PROVENTIL) (2.5 MG/3ML) 0.083% nebulizer  solution Take 3 mLs (2.5 mg total) by nebulization every 6 (six) hours as needed for wheezing or shortness of breath. 05/14/18   Denton Brick, Courage, MD  bisacodyl (DULCOLAX) 10 MG suppository Place 1 suppository (10 mg total) rectally daily as needed for moderate constipation. 08/06/18   Kathie Dike, MD  busPIRone (BUSPAR) 10 MG tablet Take 10 mg by mouth 2 (two) times daily.  05/31/15   [provider]  citalopram (CELEXA) 20 MG tablet Take 20 mg by mouth daily. 11/04/17   [provider]  enalapril (VASOTEC) 20 MG tablet Take 20 mg by mouth daily.    [provider]  Fluticasone-Salmeterol (ADVAIR DISKUS) 250-50 MCG/DOSE AEPB Inhale 1 puff into the lungs 2 (two) times daily. 11/07/17 11/07/18  Kinnie Feil, MD  gabapentin (NEURONTIN) 300 MG capsule Take 300 mg by mouth 3 (three) times daily.  02/03/14   [provider]  glipiZIDE (GLUCOTROL) 10 MG tablet Take 1 tablet (10 mg total) by mouth 2 (two) times daily before a meal. 05/14/18   Emokpae, Courage, MD  lovastatin (MEVACOR) 40 MG tablet Take 40 mg by mouth at bedtime.    [provider]  metFORMIN (GLUCOPHAGE) 1000 MG tablet Take 1 tablet (1,000 mg total) by mouth 2 (two) times daily with a meal. 05/14/18   Emokpae, Courage, MD  ondansetron (ZOFRAN ODT) 8 MG disintegrating tablet Take 1 tablet (8 mg total) by mouth every 8 (eight) hours as needed for nausea or vomiting. 08/06/18   Kathie Dike, MD  sitaGLIPtin (JANUVIA) 50 MG tablet Take 1 tablet (50 mg total) by mouth daily. 05/14/18   Roxan Hockey, MD  tiotropium (SPIRIVA HANDIHALER) 18 MCG inhalation capsule Place 1 capsule (18 mcg total) into inhaler and inhale daily. 11/07/17 11/07/18  Kinnie Feil, MD  tiZANidine (ZANAFLEX) 4 MG tablet 4 mg.  03/08/18   [provider]  traMADol (ULTRAM) 50 MG tablet Take 50 mg by mouth every 8 (eight) hours as needed for moderate pain.    [provider]    Family History Family  History  Problem Relation Age of Onset   Colon cancer Mother 48    Social History Social History   Tobacco Use   Smoking status: Current Every Day Smoker    Packs/day: 1.00    Years: 42.00    Pack years: 42.00    Types: Cigarettes   Smokeless tobacco: Former Systems developer    Quit date: 10/30/2014  Substance Use Topics   Alcohol use: No    Alcohol/week: 0.0 standard drinks   Drug use: No     Allergies   Hydrocodone and Penicillins   Review of Systems Review of Systems  Unable to perform ROS: Acuity of condition  Neurological: Positive for weakness.     Physical Exam Updated Vital Signs BP (!) 113/57    Pulse (!) 120    Temp 97.6 F (36.4 C) (Oral)    Resp (!) 24    Ht 1.626 m (5\' 4" )    Wt 84.6 kg    SpO2 (!) 89% Comment: pt wears oxygen prn at home    BMI 32.01 kg/m   Physical Exam CONSTITUTIONAL: elderly and Ill-appearing HEAD: Normocephalic/atraumatic EYES: EOMI/PERRL ENMT: Mucous membranes dry NECK: supple no meningeal signs SPINE/BACK:entire spine nontender CV: S1/S2 noted, no murmurs/rubs/gallops noted, tachycardic LUNGS: Lungs are clear to auscultation bilaterally, no apparent distress ABDOMEN: soft, nontender, scarring noted GU:no cva tenderness NEURO: Pt is awake/alert/appropriate, moves all extremitiesx4.    EXTREMITIES: pulses normal/equal, full ROM, pelvis stable, full ROM of both hips without tenderness SKIN: cool to touch PSYCH: anxious  ED Treatments / Results  Labs (all labs ordered are listed, but only abnormal results are displayed) Labs Reviewed  LACTIC ACID, PLASMA - Abnormal; Notable for the following components:      Result Value   Lactic Acid, Venous 6.9 (*)    All other components within normal limits  COMPREHENSIVE METABOLIC PANEL - Abnormal; Notable for the following components:   Sodium 133 (*)    Chloride 94 (*)    CO2 15 (*)    Glucose, Bld 169 (*)    BUN 81 (*)    Creatinine, Ser 4.31 (*)    Calcium 6.4 (*)    Total  Protein 4.4 (*)    Albumin 1.9 (*)    AST 125 (*)    ALT 67 (*)    GFR calc non Af Amer 10 (*)    GFR calc Af Amer 12 (*)    Anion gap 24 (*)    All other components within normal limits  CBC WITH DIFFERENTIAL/PLATELET - Abnormal; Notable for the following components:   WBC 20.0 (*)    Neutro Abs 12.3 (*)    Lymphs Abs 0.5 (*)    Eosinophils Absolute 5.5 (*)    Abs Immature Granulocytes 0.55 (*)    All other components within normal limits  TROPONIN I - Abnormal; Notable for the following components:   Troponin I 0.06 (*)    All other components within normal limits  I-STAT CREATININE, ED - Abnormal; Notable for the following components:   Creatinine, Ser 4.20 (*)    All other components within normal limits  CULTURE, BLOOD (ROUTINE X 2)  CULTURE, BLOOD (ROUTINE X 2)  URINE CULTURE  SARS CORONAVIRUS 2 (HOSPITAL ORDER, [place] [facility])  LACTIC ACID, PLASMA  URINALYSIS, ROUTINE W REFLEX MICROSCOPIC    EKG EKG Interpretation  Date/Time:  Sunday August 14 2018 04:51:40 EDT Ventricular Rate:  121 PR Interval:    QRS Duration: 107 QT Interval:  357 QTC Calculation: 507 R Axis:   96 Text Interpretation:  Sinus tachycardia Anterior infarct, old Prolonged QT interval Baseline wander in lead(s) V1 Confirmed by Ripley Fraise [phone]) on 08/14/2018 5:12:05 AM   Radiology Ct Abdomen Pelvis Wo Contrast  Result Date: 08/14/2018 CLINICAL DATA:  Generalized abdominal pain two days. Patient hypotensive. Recent high-grade small-bowel obstruction seen on the CT scan from August 02, 2018. EXAM: CT ABDOMEN AND PELVIS WITHOUT CONTRAST TECHNIQUE: Multidetector CT imaging of the abdomen and pelvis was performed following the standard protocol without IV contrast. COMPARISON:  August 02, 2018 FINDINGS: Lower chest:  No acute abnormality. Hepatobiliary: The low-attenuation lesion in the liver seen on the comparison CT is not well appreciated without contrast today. The patient  appears to be status post cholecystectomy. Pancreas: Unremarkable. No pancreatic ductal dilatation or surrounding inflammatory changes. Spleen: Normal in size without focal abnormality. Adrenals/Urinary Tract: Adrenal hyperplasia is noted. The kidneys demonstrate no hydronephrosis. No ureteral stones. The bladder is normal. Stomach/Bowel: The stomach is mildly distended but otherwise normal. Multiple dilated loops of small bowel are identified. While there is less dilatation than before, many the abnormal loops demonstrate new wall thickening. A dilated loop of jejunum measures up to 5.3 cm on series 2, image 29. Another thick walled loops measures 4.4 cm on image 42. The most distal loops of small bowel are decompressed. Discrete transition points are not identified on this study. Vascular/Lymphatic: Atherosclerotic changes are seen throughout the non aneurysmal aorta. No adenopathy. Reproductive: Uterus and bilateral adnexa are unremarkable. Other: There is new ascites in the abdomen. Multiple foci of free air identified. Musculoskeletal: No acute or significant osseous findings. IMPRESSION: 1. Free air in the abdomen suggests bowel perforation. A discrete site of perforation is not identified. 2. Persistent but improved dilatation of small bowel suggests ongoing obstruction. The small bowel loops now demonstrate new wall thickening which could be infectious, inflammatory, or ischemic. In my discussions with the ER physician, he raised the clinical possibility of ischemia which could result in these findings. 3. New ascites. 4. Atherosclerotic changes in the nonaneurysmal aorta. Findings discussed with Dr. Christy Gentles. Electronically Signed   By: Dorise Bullion III M.D   On: 08/14/2018 06:52   Dg Chest Port 1 View  Result Date: 08/14/2018 CLINICAL DATA:  Generalized weakness. EXAM: PORTABLE CHEST 1 VIEW COMPARISON:  May 12, 2018 FINDINGS: The heart size and mediastinal contours are within normal limits. Both  lungs are clear. The visualized skeletal structures are unremarkable. IMPRESSION: No active disease. Electronically Signed   By: Dorise Bullion III M.D   On: 08/14/2018 05:40    Procedures Procedures  CRITICAL CARE Performed by: Sharyon Cable Total critical care time: 50 minutes Critical care time was exclusive of separately billable procedures and treating other patients. Critical care was necessary to treat or prevent imminent or life-threatening deterioration. Critical care was time spent personally by me on the following activities: development of treatment plan with patient and/or surrogate as well as nursing, discussions with consultants, evaluation of patient's response to treatment, examination of patient, obtaining history from patient or surrogate, ordering and performing treatments and interventions, ordering and review of laboratory studies, ordering and review of radiographic studies, pulse oximetry and re-evaluation of patient's condition.\  Medications Ordered in ED Medications  aztreonam (AZACTAM) 2 g in sodium chloride 0.9 % 100 mL IVPB (2 g Intravenous New Bag/Given 08/14/18 0605)  metroNIDAZOLE (FLAGYL) IVPB 500 mg (500 mg Intravenous New Bag/Given 08/14/18 0606)  sodium chloride 0.9 % bolus 1,000 mL (0 mLs Intravenous Stopped 08/14/18 0625)    And  sodium chloride 0.9 % bolus 1,000 mL (1,000 mLs Intravenous New Bag/Given 08/14/18 0544)    And  sodium chloride 0.9 % bolus 1,000 mL (has no administration in time range)  vancomycin (VANCOCIN) IVPB 1000 mg/200 mL premix (has no administration in time range)    And  vancomycin (VANCOCIN) IVPB 750 mg/150 ml premix (has no administration in time range)  0.9 %  sodium chloride infusion (has no administration in time range)  norepinephrine (LEVOPHED) 4mg  in 24mL premix infusion (has no administration  in time range)     Initial Impression / Assessment and Plan / ED Course  I have reviewed the triage vital signs and the  nursing notes.  Pertinent labs & imaging results that were available during my care of the patient were reviewed by me and considered in my medical decision making (see chart for details).        6:05 AM Patient seen soon after arrival for generalized weakness. Recent hospital stay for SBO  She reported left hip pain, but no signs of trauma.  She denied abdominal pain at first All work-up was pending, patient became abruptly worse with hypotension.  She is cool to touch, but is awake and alert but appears confused.  IV fluids have been ordered.  She now has generalized abdominal tenderness.  I am concerned for acute abdominal emergency.  She will require CT imaging. 6:07 AM D/w husband Ernie Hew [phone] He reports since discharge she was doing well but had decreased PO She has been having BMs Friday she became "tired" She had abdominal pain - Dr Gerarda Fraction gave her oxycodone last She has been "lethargic" for past 2 days No falls No fever/vomiting 6:34 AM Patient had acute renal failure, metabolic acidosis.  I reviewed CT imaging with the radiologist on call, it appears she has small bowel obstruction with perforation.  I discussed the case with Dr. Aviva Signs with general surgery who will see the patient.  We will continue fluid resuscitation, and I have added on IV Levophed for blood pressure support patient is critically ill 7:53 AM Pt seen by mark jenkins He plans to place central line He plans for operative management Patient/husband have been updated on plan   Final Clinical Impressions(s) / ED Diagnoses   Final diagnoses:  SBO (small bowel obstruction) ([place])  Pneumoperitoneum  Weakness  Metabolic acidosis  AKI (acute kidney injury) [facility])    ED Discharge Orders    None       Ripley Fraise, MD 08/14/18 [phone]

## 2018-08-14 NOTE — ED Triage Notes (Addendum)
Pt c/o generalized weakness that started Friday and became worse over the weekend,upon ems arrival to residence pt was hypotensive with systolic in mid 63'A, pt given 500cc NS bolus while enroute to er,  denies any pain, denies any fever, n/v/d, recently admitted to hospital for SBO. Upon pt arrival to er pt able to answer questions, restless in bed, cool to touch,

## 2018-08-14 NOTE — ED Notes (Signed)
Date and time results received:   Test: lactic acid Critical Value: 6.9  Name of Provider Notified: Dr Christy Gentles  Orders Received? Or Actions Taken?: none

## 2018-08-14 NOTE — Consult Note (Signed)
Reason for Consult: Peritonitis, pneumoperitoneum Referring Physician: Dr. Theo Dills is an 68 y.o. Kennedy.  HPI: Patient is a [name] discharged on 08/06/2018 from [facility] for partial bowel obstruction who presented to the emergency room this morning with a 2-day history of worsening abdominal pain.  She was found to be hypotensive.  She had an elevated lactic acid level and acute renal failure.  CT scan of the abdomen without contrast shows pneumoperitoneum with significant ascites.  Patient states that she was doing well until the last 2 days she started having worsening abdominal pain.  Her pain is made worse with movement.  Past Medical History:  Diagnosis Date  . Arthritis    "hands, knees, feet"  . Asthma   . Colon polyps   . COPD (chronic obstructive pulmonary disease) ([place])   . Hyperlipidemia   . Hypertension   . Peripheral edema   . Pneumonia 08/2010; 11/2010  . SBO (small bowel obstruction) ([place]) 08/25/11   recurrent  . Type II diabetes mellitus ([place])     Past Surgical History:  Procedure Laterality Date  . APPENDECTOMY    . CHOLECYSTECTOMY  1979  . COLONOSCOPY  2008   Dr. Oneida Alar: 78mm sessile cecal polyp and 83mm sessile transverse colon polyp, torturous sigmoid colon, fragments of tubular adenomas on path   . COLONOSCOPY WITH PROPOFOL N/A 08/13/2015   Procedure: COLONOSCOPY WITH PROPOFOL;  Surgeon: Danie Binder, MD;  [facility];  Service: Endoscopy;  Laterality: N/A;  [id number]  . ESOPHAGEAL DILATION     remote past by Dr. Tamala Julian  . HERNIA REPAIR    . KNEE CARTILAGE SURGERY  07/2003   right/E-chart  . LAPAROSCOPIC INCISIONAL / UMBILICAL / VENTRAL HERNIA REPAIR  ? til 2002   "~ 13 times"  . POLYPECTOMY  08/13/2015   Procedure: POLYPECTOMY;  Surgeon: Danie Binder, MD;  Location[facility];  Service: Endoscopy;;  ascending colon polyp, hepatic flexure polyp, transverse colon polyp, descending colon polyp, sigmoid colon  polyp, rectal polyps times 3  . SMALL INTESTINE SURGERY     Feb 2015 at [facility] and then remote past   . [place]    Family History  Problem Relation Age of Onset  . Colon cancer Mother 21    Social History:  reports that she has been smoking cigarettes. She has a 42.00 pack-year smoking history. She quit smokeless tobacco use about 3 years ago. She reports that she does not drink alcohol or use drugs.  Allergies:  Allergies  Allergen Reactions  . Hydrocodone Itching  . Penicillins Hives and Itching    Has patient had a PCN reaction causing immediate rash, facial/tongue/throat swelling, SOB or lightheadedness with hypotension: Yes Has patient had a PCN reaction causing severe rash involving mucus membranes or skin necrosis: No Has patient had a PCN reaction that required hospitalization No Has patient had a PCN reaction occurring within the last 10 years: No If all of the above answers are "NO", then may proceed with Cephalosporin use.  Has patient had a PCN reaction causing immediate rash, facial/tongue/throat swelling, SOB or lightheadedness with hypotension: Yes Has patient had a PCN reaction causing severe rash involving mucus membranes or skin necrosis: No Has patient had a PCN reaction that required hospitalization No Has patient had a PCN reaction occurring within the last 10 years: No If all of the above answers are "NO", then may proceed with Cephalosporin use. Can tolerate  cephalosporins, per pt Electronically signed by: Rudene Christians, [facility] 06/07/2013 10:43 AM    Medications: I have reviewed the patient's current medications.  Results for orders placed or performed during the hospital encounter of 08/14/18 (from the past 48 hour(s))  Urinalysis, Routine w reflex microscopic     Status: Abnormal   Collection Time: 08/14/18  5:20 AM  Result Value Ref Range   Color, Urine AMBER (A) YELLOW    Comment: BIOCHEMICALS MAY BE AFFECTED BY COLOR   APPearance  CLOUDY (A) CLEAR   Specific Gravity, Urine 1.015 1.005 - 1.030   pH 5.0 5.0 - 8.0   Glucose, UA 50 (A) NEGATIVE mg/dL   Hgb urine dipstick LARGE (A) NEGATIVE   Bilirubin Urine NEGATIVE NEGATIVE   Ketones, ur NEGATIVE NEGATIVE mg/dL   Protein, ur NEGATIVE NEGATIVE mg/dL   Nitrite NEGATIVE NEGATIVE   Leukocytes,Ua NEGATIVE NEGATIVE   RBC / HPF 0-5 0 - 5 RBC/hpf   WBC, UA 6-10 0 - 5 WBC/hpf   Bacteria, UA RARE (A) NONE SEEN   Squamous Epithelial / LPF 6-10 0 - 5   WBC Clumps PRESENT    Mucus PRESENT    Budding Yeast PRESENT    Hyaline Casts, UA PRESENT     Comment: Performed at [facility], [address] [place], [place] [address]  Blood Culture (routine x 2)     Status: None (Preliminary result)   Collection Time: 08/14/18  5:25 AM  Result Value Ref Range   Specimen Description      BLOOD RIGHT ARM BOTTLES DRAWN AEROBIC AND ANAEROBIC   Special Requests      Blood Culture adequate volume Performed at [facility], [address] [place], [place] [address]    Culture PENDING    Report Status PENDING   Lactic acid, plasma     Status: Abnormal   Collection Time: 08/14/18  5:41 AM  Result Value Ref Range   Lactic Acid, Venous 6.9 (HH) 0.5 - 1.9 mmol/L    Comment: CRITICAL RESULT CALLED TO, READ BACK BY AND VERIFIED WITH: POINTDEXTER AT 0616 ON 08/14/2018 BY JPM Performed at [facility], [address], [place], [place] [address]   Comprehensive metabolic panel     Status: Abnormal   Collection Time: 08/14/18  5:41 AM  Result Value Ref Range   Sodium 133 (L) 135 - 145 mmol/L   Potassium 4.8 3.5 - 5.1 mmol/L   Chloride 94 (L) 98 - 111 mmol/L   CO2 15 (L) 22 - 32 mmol/L   Glucose, Bld 169 (H) 70 - 99 mg/dL   BUN 81 (H) 8 - 23 mg/dL   Creatinine, Ser 4.31 (H) 0.44 - 1.00 mg/dL   Calcium 6.4 (LL) 8.9 - 10.3 mg/dL    Comment: CRITICAL RESULT CALLED TO, READ BACK BY AND VERIFIED WITH: MINTER,R AT 0700 ON 08/14/2018 BY JPM    Total Protein 4.4 (L) 6.5 - 8.1 g/dL   Albumin 1.9  (L) 3.5 - 5.0 g/dL   AST 125 (H) 15 - 41 U/L   ALT 67 (H) 0 - 44 U/L   Alkaline Phosphatase 71 38 - 126 U/L   Total Bilirubin 0.6 0.3 - 1.2 mg/dL   GFR calc non Af Amer 10 (L) >60 mL/min   GFR calc Af Amer 12 (L) >60 mL/min   Anion gap 24 (H) 5 - 15    Comment: Performed at [facility], [address], [place], [place] [address]  CBC WITH DIFFERENTIAL  Status: Abnormal   Collection Time: 08/14/18  5:41 AM  Result Value Ref Range   WBC 20.0 (H) 4.0 - 10.5 K/uL    Comment: WHITE COUNT CONFIRMED ON SMEAR   RBC 4.47 3.87 - 5.11 MIL/uL   Hemoglobin 13.7 12.0 - 15.0 g/dL   HCT 41.3 36.0 - 46.0 %   MCV 92.4 80.0 - 100.0 fL   MCH 30.6 26.0 - 34.0 pg   MCHC 33.2 30.0 - 36.0 g/dL   RDW 13.2 11.5 - 15.5 %   Platelets 268 150 - 400 K/uL   nRBC 0.0 0.0 - 0.2 %   Neutrophils Relative % 60 %   Neutro Abs 12.3 (H) 1.7 - 7.7 K/uL   Lymphocytes Relative 3 %   Lymphs Abs 0.5 (L) 0.7 - 4.0 K/uL   Monocytes Relative 5 %   Monocytes Absolute 1.0 0.1 - 1.0 K/uL   Eosinophils Relative 28 %   Eosinophils Absolute 5.5 (H) 0.0 - 0.5 K/uL   Basophils Relative 1 %   Basophils Absolute 0.1 0.0 - 0.1 K/uL   WBC Morphology      MODERATE LEFT SHIFT (>5% METAS AND MYELOS,OCC PRO NOTED)    Comment: VACUOLATED NEUTROPHILS   Immature Granulocytes 3 %   Abs Immature Granulocytes 0.55 (H) 0.00 - 0.07 K/uL   Reactive, Benign Lymphocytes PRESENT     Comment: Performed at [facility], [address] [place], [place] [address]  Troponin I - ONCE - STAT     Status: Abnormal   Collection Time: 08/14/18  5:41 AM  Result Value Ref Range   Troponin I 0.06 (HH) <0.03 ng/mL    Comment: CRITICAL RESULT CALLED TO, READ BACK BY AND VERIFIED WITH: MINTER,R AT 0700 ON 08/14/2018 BY JPM Performed at [facility], [address] [place], [place] [address]   Blood Culture (routine x 2)     Status: None (Preliminary result)   Collection Time: 08/14/18  5:53 AM  Result Value Ref Range   Specimen Description BLOOD LEFT  WRIST BOTTLES DRAWN AEROBIC ONLY    Special Requests      Blood Culture adequate volume Performed at [facility], [address] [place], [place] [address]    Culture PENDING    Report Status PENDING   I-Stat Creatinine, ED (not at [facility])     Status: Abnormal   Collection Time: 08/14/18  6:05 AM  Result Value Ref Range   Creatinine, Ser 4.20 (H) 0.44 - 1.00 mg/dL  Lactic acid, plasma     Status: Abnormal   Collection Time: 08/14/18  7:20 AM  Result Value Ref Range   Lactic Acid, Venous 3.0 (HH) 0.5 - 1.9 mmol/L    Comment: CRITICAL RESULT CALLED TO, READ BACK BY AND VERIFIED WITH: MINTER,B AT 0915 ON 08/14/2018 BY JPM Performed at [facility], [address] [place], [place] [address]   SARS Coronavirus 2 [facility] order, Performed in [place] hospital lab)     Status: None   Collection Time: 08/14/18  7:43 AM  Result Value Ref Range   SARS Coronavirus 2 NEGATIVE NEGATIVE    Comment: (NOTE) If result is NEGATIVE SARS-CoV-2 target nucleic acids are NOT DETECTED. The SARS-CoV-2 RNA is generally detectable in upper and lower  respiratory specimens during the acute phase of infection. The lowest  concentration of SARS-CoV-2 viral copies this assay can detect is 250  copies / mL. A negative result does not preclude SARS-CoV-2 infection  and should not be used as the  sole basis for treatment or other  patient management decisions.  A negative result may occur with  improper specimen collection / handling, submission of specimen other  than nasopharyngeal swab, presence of viral mutation(s) within the  areas targeted by this assay, and inadequate number of viral copies  (<250 copies / mL). A negative result must be combined with clinical  observations, patient history, and epidemiological information. If result is POSITIVE SARS-CoV-2 target nucleic acids are DETECTED. The SARS-CoV-2 RNA is generally detectable in upper and lower  respiratory specimens dur ing the acute phase of  infection.  Positive  results are indicative of active infection with SARS-CoV-2.  Clinical  correlation with patient history and other diagnostic information is  necessary to determine patient infection status.  Positive results do  not rule out bacterial infection or co-infection with other viruses. If result is PRESUMPTIVE POSTIVE SARS-CoV-2 nucleic acids MAY BE PRESENT.   A presumptive positive result was obtained on the submitted specimen  and confirmed on repeat testing.  While 2019 novel coronavirus  (SARS-CoV-2) nucleic acids may be present in the submitted sample  additional confirmatory testing may be necessary for epidemiological  and / or clinical management purposes  to differentiate between  SARS-CoV-2 and other Sarbecovirus currently known to infect humans.  If clinically indicated additional testing with an alternate test  methodology [phone]) is advised. The SARS-CoV-2 RNA is generally  detectable in upper and lower respiratory sp ecimens during the acute  phase of infection. The expected result is Negative. Fact Sheet for Patients:  StrictlyIdeas.no Fact Sheet for Healthcare Providers: BankingDealers.co.za This test is not yet approved or cleared by the [place] FDA and has been authorized for detection and/or diagnosis of SARS-CoV-2 by FDA under an Emergency Use Authorization (EUA).  This EUA will remain in effect (meaning this test can be used) for the duration of the COVID-19 declaration under Section 564(b)(1) of the Act, 21 U.S.C. section 360bbb-3(b)(1), unless the authorization is terminated or revoked sooner. Performed at [facility], [address] [place], [place] [address]     Ct Abdomen Pelvis Wo Contrast  Result Date: 08/14/2018 CLINICAL DATA:  Generalized abdominal pain two days. Patient hypotensive. Recent high-grade small-bowel obstruction seen on the CT scan from August 02, 2018. EXAM: CT ABDOMEN  AND PELVIS WITHOUT CONTRAST TECHNIQUE: Multidetector CT imaging of the abdomen and pelvis was performed following the standard protocol without IV contrast. COMPARISON:  August 02, 2018 FINDINGS: Lower chest: No acute abnormality. Hepatobiliary: The low-attenuation lesion in the liver seen on the comparison CT is not well appreciated without contrast today. The patient appears to be status post cholecystectomy. Pancreas: Unremarkable. No pancreatic ductal dilatation or surrounding inflammatory changes. Spleen: Normal in size without focal abnormality. Adrenals/Urinary Tract: Adrenal hyperplasia is noted. The kidneys demonstrate no hydronephrosis. No ureteral stones. The bladder is normal. Stomach/Bowel: The stomach is mildly distended but otherwise normal. Multiple dilated loops of small bowel are identified. While there is less dilatation than before, many the abnormal loops demonstrate new wall thickening. A dilated loop of jejunum measures up to 5.3 cm on series 2, image 29. Another thick walled loops measures 4.4 cm on image 42. The most distal loops of small bowel are decompressed. Discrete transition points are not identified on this study. Vascular/Lymphatic: Atherosclerotic changes are seen throughout the non aneurysmal aorta. No adenopathy. Reproductive: Uterus and bilateral adnexa are unremarkable. Other: There is new ascites in the abdomen. Multiple foci of free air identified. Musculoskeletal: No acute or  significant osseous findings. IMPRESSION: 1. Free air in the abdomen suggests bowel perforation. A discrete site of perforation is not identified. 2. Persistent but improved dilatation of small bowel suggests ongoing obstruction. The small bowel loops now demonstrate new wall thickening which could be infectious, inflammatory, or ischemic. In my discussions with the ER physician, he raised the clinical possibility of ischemia which could result in these findings. 3. New ascites. 4. Atherosclerotic  changes in the nonaneurysmal aorta. Findings discussed with Dr. Christy Gentles. Electronically Signed   By: Dorise Bullion III M.D   On: 08/14/2018 06:52   Dg Chest Portable 1 View  Result Date: 08/14/2018 CLINICAL DATA:  Central line placement EXAM: PORTABLE CHEST 1 VIEW COMPARISON:  None. FINDINGS: The new right central line terminates in the central SVC. No pneumothorax. The cardiomediastinal silhouette is normal. No pulmonary nodules or masses. No focal infiltrates. No overt edema. IMPRESSION: The new right central line terminates in the central SVC. No pneumothorax. No other acute abnormalities. Electronically Signed   By: Dorise Bullion III M.D   On: 08/14/2018 08:29   Dg Chest Port 1 View  Result Date: 08/14/2018 CLINICAL DATA:  Generalized weakness. EXAM: PORTABLE CHEST 1 VIEW COMPARISON:  May 12, 2018 FINDINGS: The heart size and mediastinal contours are within normal limits. Both lungs are clear. The visualized skeletal structures are unremarkable. IMPRESSION: No active disease. Electronically Signed   By: Dorise Bullion III M.D   On: 08/14/2018 05:40    ROS:  Pertinent items are noted in HPI.  Blood pressure (!) 127/45, pulse (!) 110, temperature 98.3 F (36.8 C), resp. rate (!) 30, height 5\' 4"  (1.626 m), weight 84.6 kg, SpO2 96 %. Physical Exam: Alert and oriented in moderate distress when moved. Head is normocephalic, atraumatic Lungs relatively clear to auscultation with equal breath sounds bilaterally Heart examination reveals intermittent sinus tachycardia. Abdomen is rigid.  Multiple surgical scars are present.  It is distended.  CT scan images personally reviewed  Assessment/Plan: Impression: Sepsis secondary to peritonitis, probable rupture of small bowel with ischemia.  Acute renal failure. Plan: Patient was resuscitated with multiple liters of IV fluid and intermittent Levophed.  Central line has been placed.  Her blood pressure has stabilized.  Due to her multiple  comorbidities, she is being transferred to [facility] for further evaluation and treatment as her postoperative care will be outside our realm of expertise.  Aviva Signs 08/14/2018, 10:22 AM

## 2018-08-14 NOTE — ED Notes (Signed)
EDP Dr. Sabra Heck made aware of maximum dose of Levophed of 53mg/min met.

## 2018-08-14 NOTE — ED Notes (Signed)
Consent obtained at this time for placement of central line. Husband present at bedside at this time.

## 2018-08-14 NOTE — ED Notes (Signed)
Waiting on chest xray for placement verification. Dr. Arnoldo Morale inserted triple lumen central line in right sub-clavian vein. Procedure complete at this time.

## 2018-08-14 NOTE — ED Notes (Signed)
PT left with [facility] transfer team at this time.

## 2018-08-14 NOTE — Progress Notes (Signed)
ANTIBIOTIC CONSULT NOTE-Preliminary  Pharmacy Consult for Vancomycin & Aztreonam Indication: sepsis  Allergies  Allergen Reactions  . Hydrocodone Itching  . Penicillins Hives and Itching    Has patient had a PCN reaction causing immediate rash, facial/tongue/throat swelling, SOB or lightheadedness with hypotension: Yes Has patient had a PCN reaction causing severe rash involving mucus membranes or skin necrosis: No Has patient had a PCN reaction that required hospitalization No Has patient had a PCN reaction occurring within the last 10 years: No If all of the above answers are "NO", then may proceed with Cephalosporin use.  Has patient had a PCN reaction causing immediate rash, facial/tongue/throat swelling, SOB or lightheadedness with hypotension: Yes Has patient had a PCN reaction causing severe rash involving mucus membranes or skin necrosis: No Has patient had a PCN reaction that required hospitalization No Has patient had a PCN reaction occurring within the last 10 years: No If all of the above answers are "NO", then may proceed with Cephalosporin use. Can tolerate cephalosporins, per pt Electronically signed by: Rudene Christians, [facility] 06/07/2013 10:43 AM    Patient Measurements: Height: 5\' 4"  (162.6 cm) Weight: 186 lb 8.2 oz (84.6 kg) IBW/kg (Calculated) : 54.7 kg   Vital Signs: Temp: 97.6 F (36.4 C) (04/26 0459) Temp Source: Oral (04/26 0459) BP: 113/57 (04/26 0459) Pulse Rate: 120 (04/26 0459)  Labs: No results for input(s): WBC, HGB, PLT, LABCREA, CREATININE in the last 72 hours.  Estimated Creatinine Clearance: 73.8 mL/min (by C-G formula based on SCr of 0.68 mg/dL).  No results for input(s): VANCOTROUGH, VANCOPEAK, VANCORANDOM, GENTTROUGH, GENTPEAK, GENTRANDOM, TOBRATROUGH, TOBRAPEAK, TOBRARND, AMIKACINPEAK, AMIKACINTROU, AMIKACIN in the last 72 hours.   Microbiology: No results found for this or any previous visit (from the past 720 hour(s)).  Medical  History: Past Medical History:  Diagnosis Date  . Arthritis    "hands, knees, feet"  . Asthma   . Colon polyps   . COPD (chronic obstructive pulmonary disease) ([place])   . Hyperlipidemia   . Hypertension   . Peripheral edema   . Pneumonia 08/2010; 11/2010  . SBO (small bowel obstruction) ([place]) 08/25/11   recurrent  . Type II diabetes mellitus ([facility])     Medications:  Anti-infectives (From admission, onward)   Start     Dose/Rate Route Frequency Ordered Stop   08/14/18 0630  vancomycin (VANCOCIN) IVPB 750 mg/150 ml premix     750 mg 150 mL/hr over 60 Minutes Intravenous  Once 08/14/18 0533     08/14/18 0545  vancomycin (VANCOCIN) IVPB 1000 mg/200 mL premix     1,000 mg 200 mL/hr over 60 Minutes Intravenous  Once 08/14/18 0533     08/14/18 0530  aztreonam (AZACTAM) 2 g in sodium chloride 0.9 % 100 mL IVPB     2 g 200 mL/hr over 30 Minutes Intravenous  Once 08/14/18 0520     08/14/18 0530  metroNIDAZOLE (FLAGYL) IVPB 500 mg     500 mg 100 mL/hr over 60 Minutes Intravenous  Once 08/14/18 0520     08/14/18 0530  vancomycin (VANCOCIN) IVPB 1000 mg/200 mL premix  Status:  Discontinued     1,000 mg 200 mL/hr over 60 Minutes Intravenous  Once 08/14/18 0520 08/14/18 0532      Assessment: 68 yo female with suspected sepsis.  Due to PCN allergy that involves tongue swelling and/or SOB, pt started on Aztreonam & Vancomycin   Goal of Therapy:  Vancomycin trough level 15-20 mcg/ml  Plan:  Preliminary review of  pertinent patient information completed.  Protocol will be initiated with dose(s) of Vancomycin 1750 mg IV x1 and Aztreonam 2 grams IV x1 (ordered by MD).  Forestine Na clinical pharmacist will complete review during morning rounds to assess patient and finalize treatment regimen if needed.  Beryle Lathe, [facility] 08/14/2018,5:36 AM

## 2018-08-14 NOTE — Op Note (Signed)
Patient:  [name]  DOB:  10/01/1950  MRN:  [id number]   Preop Diagnosis: Sepsis, acute renal failure, need for resuscitation  Postop Diagnosis: Same  Procedure: Central line placement  Surgeon: Aviva Signs, MD  Anes: Local  Indications: Patient is a 68 year old white female who presented to the emergency room hypotensive with peritoneal signs and perforated bowel.  Central line placement as needed for further vasoactive pressor support and IV fluids.  The risks and benefits of the procedure including bleeding, infection, and pneumothorax were fully explained to the patient, who gave informed consent.  Procedure note: The patient was placed in the Trendelenburg position.  The right upper chest was prepped and draped using the usual sterile technique with ChloraPrep.  Surgical site confirmation was performed.  Sterile technique including gown, mask, and drape were all used.  1% Xylocaine was used for local anesthesia.  A needle was advanced into the right subclavian vein using the Seldinger technique without difficulty.  A guidewire was then advanced without difficulty.  Some ectopy was noted and the wire was pulled back.  Introducer was placed over the guidewire.  A triple-lumen catheter was then inserted to the 16 cm Breeanna Galgano without difficulty.  Good backflow of venous blood was noted on aspiration of all 3 ports.  All 3 ports were flushed with saline.  The catheter was secured in the place using 3-0 silk sutures.  A dry sterile dressing was applied.  The patient tolerated the procedure well.  A chest x-ray is pending.  Complications: None  EBL: Minimal  Specimen: None

## 2018-08-14 NOTE — ED Notes (Signed)
CRITICAL VALUE ALERT  Critical Value: Lactic acid 3.0  Date & Time Notied:  0915, 08/14/2018  Provider Notified: Dr. Sabra Heck  Orders Received/Actions taken: no new orders

## 2018-08-15 LAB — URINE CULTURE: Culture: NO GROWTH

## 2018-08-19 DEATH — deceased

## 2018-08-20 LAB — CULTURE, BLOOD (ROUTINE X 2)
Culture: NO GROWTH
Culture: NO GROWTH
Special Requests: ADEQUATE
Special Requests: ADEQUATE

## 2018-08-24 ENCOUNTER — Encounter: Payer: Self-pay | Admitting: Gastroenterology

## 2018-09-05 ENCOUNTER — Ambulatory Visit: Payer: Medicare HMO | Admitting: Orthopedic Surgery
